# Patient Record
Sex: Female | Born: 1950 | Race: White | Hispanic: No | Marital: Married | Smoking: Never smoker
Health system: Southern US, Community
[De-identification: ages and names within clinical notes are randomized; demographics above are authoritative.]

## PROBLEM LIST (undated history)

## (undated) DIAGNOSIS — R7989 Other specified abnormal findings of blood chemistry: Secondary | ICD-10-CM

## (undated) DIAGNOSIS — I10 Essential (primary) hypertension: Secondary | ICD-10-CM

## (undated) DIAGNOSIS — R778 Other specified abnormalities of plasma proteins: Secondary | ICD-10-CM

## (undated) DIAGNOSIS — K589 Irritable bowel syndrome without diarrhea: Secondary | ICD-10-CM

## (undated) DIAGNOSIS — E039 Hypothyroidism, unspecified: Secondary | ICD-10-CM

## (undated) DIAGNOSIS — N280 Ischemia and infarction of kidney: Secondary | ICD-10-CM

## (undated) HISTORY — PX: TONSILLECTOMY: SUR1361

## (undated) HISTORY — PX: AUGMENTATION MAMMAPLASTY: SUR837

## (undated) HISTORY — DX: Other specified abnormalities of plasma proteins: R77.8

## (undated) HISTORY — PX: ECTOPIC PREGNANCY SURGERY: SHX613

## (undated) HISTORY — PX: KNEE SURGERY: SHX244

## (undated) HISTORY — PX: APPENDECTOMY: SHX54

## (undated) HISTORY — PX: CHOLECYSTECTOMY: SHX55

## (undated) HISTORY — DX: Other specified abnormal findings of blood chemistry: R79.89

---

## 1999-11-18 ENCOUNTER — Other Ambulatory Visit: Admission: RE | Admit: 1999-11-18 | Discharge: 1999-11-18 | Payer: Self-pay | Admitting: Obstetrics and Gynecology

## 2000-03-23 ENCOUNTER — Encounter: Admission: RE | Admit: 2000-03-23 | Discharge: 2000-03-23 | Payer: Self-pay | Admitting: Orthopedic Surgery

## 2000-03-23 ENCOUNTER — Encounter: Payer: Self-pay | Admitting: Orthopedic Surgery

## 2001-02-10 ENCOUNTER — Other Ambulatory Visit: Admission: RE | Admit: 2001-02-10 | Discharge: 2001-02-10 | Payer: Self-pay | Admitting: Obstetrics and Gynecology

## 2002-01-01 ENCOUNTER — Emergency Department (HOSPITAL_COMMUNITY): Admission: EM | Admit: 2002-01-01 | Discharge: 2002-01-02 | Payer: Self-pay

## 2002-02-12 ENCOUNTER — Other Ambulatory Visit: Admission: RE | Admit: 2002-02-12 | Discharge: 2002-02-12 | Payer: Self-pay | Admitting: Obstetrics and Gynecology

## 2003-04-19 ENCOUNTER — Other Ambulatory Visit: Admission: RE | Admit: 2003-04-19 | Discharge: 2003-04-19 | Payer: Self-pay | Admitting: Obstetrics and Gynecology

## 2004-04-20 ENCOUNTER — Other Ambulatory Visit: Admission: RE | Admit: 2004-04-20 | Discharge: 2004-04-20 | Payer: Self-pay | Admitting: Obstetrics and Gynecology

## 2005-03-25 ENCOUNTER — Encounter: Admission: RE | Admit: 2005-03-25 | Discharge: 2005-03-25 | Payer: Self-pay | Admitting: Orthopedic Surgery

## 2005-05-03 ENCOUNTER — Other Ambulatory Visit: Admission: RE | Admit: 2005-05-03 | Discharge: 2005-05-03 | Payer: Self-pay | Admitting: Obstetrics and Gynecology

## 2006-11-22 ENCOUNTER — Other Ambulatory Visit: Admission: RE | Admit: 2006-11-22 | Discharge: 2006-11-22 | Payer: Self-pay | Admitting: Obstetrics and Gynecology

## 2007-08-01 ENCOUNTER — Encounter: Admission: RE | Admit: 2007-08-01 | Discharge: 2007-08-01 | Payer: Self-pay | Admitting: Orthopedic Surgery

## 2007-09-13 ENCOUNTER — Inpatient Hospital Stay (HOSPITAL_COMMUNITY): Admission: EM | Admit: 2007-09-13 | Discharge: 2007-09-17 | Payer: Self-pay | Admitting: Emergency Medicine

## 2007-12-13 ENCOUNTER — Other Ambulatory Visit: Admission: RE | Admit: 2007-12-13 | Discharge: 2007-12-13 | Payer: Self-pay | Admitting: Obstetrics and Gynecology

## 2010-02-27 ENCOUNTER — Other Ambulatory Visit: Admission: RE | Admit: 2010-02-27 | Discharge: 2010-02-27 | Payer: Self-pay | Admitting: Obstetrics and Gynecology

## 2010-02-27 ENCOUNTER — Ambulatory Visit: Payer: Self-pay | Admitting: Obstetrics and Gynecology

## 2010-04-15 ENCOUNTER — Ambulatory Visit: Payer: Self-pay | Admitting: Obstetrics and Gynecology

## 2011-01-13 ENCOUNTER — Emergency Department (HOSPITAL_BASED_OUTPATIENT_CLINIC_OR_DEPARTMENT_OTHER)
Admission: EM | Admit: 2011-01-13 | Discharge: 2011-01-13 | Disposition: A | Discharge status: Home / Self Care | Payer: BC Managed Care – PPO | Attending: Emergency Medicine | Admitting: Emergency Medicine

## 2011-01-13 DIAGNOSIS — I10 Essential (primary) hypertension: Secondary | ICD-10-CM | POA: Insufficient documentation

## 2011-01-13 DIAGNOSIS — E039 Hypothyroidism, unspecified: Secondary | ICD-10-CM | POA: Insufficient documentation

## 2011-01-13 DIAGNOSIS — Z79899 Other long term (current) drug therapy: Secondary | ICD-10-CM | POA: Insufficient documentation

## 2011-01-26 NOTE — Discharge Summary (Signed)
NAMEABRIANA, SALTOS NO.:  [id number]   MEDICAL RECORD NO.:  [id number]          PATIENT TYPE:  INP   LOCATION:  1314                         FACILITY:  [facility]   PHYSICIAN:  Lonia Blood, M.D.       DATE OF BIRTH:  Jun 14, 1951   DATE OF ADMISSION:  09/13/2007  DATE OF DISCHARGE:  09/17/2007                               DISCHARGE SUMMARY   PRIMARY CARE PHYSICIAN:  Dr. Derrell Lolling, [place], [place].   DISCHARGE DIAGNOSES:  1. Left facial cellulitis.  2. Left ear cellulitis.  3. Hypertension.  4. Diarrhea related to antibiotics.  5. Vaginitis related to antibiotics.  6. Status post cholecystectomy.   DISCHARGE MEDICATIONS:  1. Septra DS one tablet p.o. b.i.d. for one week.  2. Doxepin 100 mg p.o. daily for one week.  3. Ibuprofen p.r.n. pain.  4. Imodium for diarrhea.  5. Benicar 10 mg daily.   CONDITION ON DISCHARGE:  Melinda Lane was discharged in good condition.   PHYSICAL EXAMINATION:  At the time of discharge, the patient is alert  and oriented, without any significant discomfort.  She is without any  signs of sepsis.  She is afebrile and stable.   DISCHARGE INSTRUCTIONS:  The patient has been told to report back to the  emergency room if she has worsening facial pain.  The patient has been  told to follow up with Dr. Derrell Lolling, her primary care physician in 3 to 4  days, to assure resolution of her cellulitis.   PROCEDURES DURING ADMISSION:  The patient underwent a CT scan of the  head, face and neck.  Findings of soft tissue swelling of the left  pinna, with periauricular and venous inflammation.  No evidence of  absence, mastoiditis or chondritis.  No evidence of abscess of the neck.  Minimal lymphadenopathy of the left side of the neck.   CONSULTATIONS:  None obtained.   HISTORY & PHYSICAL:  Refer to the dictated H&P done by Dr. Karilyn Cota on  09/13/07.   HOSPITAL COURSE:  1. Left facial cellulitis.  Mrs. Buenaventura was admitted on 12/31 with  worsening left face pain.  She had erythema and edema of her left      pinna.  The patient underwent a CT scan of the face, which was      negative for abscess formation.  The patient was treated with      vancomycin and Zosyn.  By 09/15/07, we had narrowed the antibiotics      to vancomycin only.  On 09/16/07, the patient was switched to oral      antibiotics - Septra b.i.d. She has remained stable, and her      cellulitis has since improved.  We feel that the patient can be      safely discharged home under the care of her primary care physician      with oral antibiotics and close followup.  2. Diarrhea.  On 09/15/07, the patient started having diarrhea.  Stool      was sent for C. difficile and she did not test positive.  We felt      that her diarrhea was secondary to Zosyn, and once we have      discontinued the antibiotic, the patient's diarrhea stopped.  3. Vaginitis.  This was probably related to the antibiotic use.  With      the initiation of Diflucan, the patient's symptoms are resolving.      We are going to treat the patient with Diflucan throughout the      course of antibiotics.      Lonia Blood, M.D.  Electronically Signed     SL/MEDQ  D:  09/17/2007  T:  09/17/2007  Job:  [id number]

## 2011-01-26 NOTE — H&P (Signed)
NAME:  Melinda Lane, Lane NO.:  [id number]   MEDICAL RECORD NO.:  [id number]          PATIENT TYPE:  EMS   LOCATION:  ED                           FACILITY:  [facility]   PHYSICIAN:  Wilson Singer, M.D.DATE OF BIRTH:  1951/02/21   DATE OF ADMISSION:  09/13/2007  DATE OF DISCHARGE:                              HISTORY & PHYSICAL   PRIMARY CARE PHYSICIAN:  Unassigned.   HISTORY:  This is a very pleasant 60 year old lady who noticed swelling  and redness of her left ear and left face.  She was trying to put in her  earring approximately 3 days ago and has was having difficulty putting  it in to the left ear lobe.  There was some localized bleeding when she  attempted to do this.  After this event, and she thinks she also may  have been in contact with some chicken and had touched that left ear,  she started to notice swelling of the left ear and redness as well as  facial redness.  She went to see her primary care physician yesterday  who gave her intramuscular antibiotic of some sort and swelling and  redness has not improved, in fact, has gotten worse.  She has not been  feeling feverish herself.   PAST MEDICAL HISTORY:  1. Hypertension.  2. Allergies for which she takes allergy shots.   PAST SURGICAL HISTORY:  1. Appendectomy at the age of 62.  2. Cholecystectomy.  3. Left salpingectomy following a tubal pregnancy.  4. Bilateral knee arthroscopies.   SOCIAL HISTORY:  She has been married for 36 years.  She does not smoke  and does not drink alcohol.  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   MEDICATIONS:  1. Benicar 10 mg daily.  2. Claritin 10 mg daily   ALLERGIES:  ZITHROMAX.   FAMILY HISTORY:  Noncontributory.   REVIEW OF SYSTEMS:  Apart from the symptoms mentioned above, there are  no other symptoms referable to all systems reviewed.   PHYSICAL EXAMINATION:  VITAL SIGNS:  Temperature 98.9, blood pressure  120/68, pulse 75, respiratory rate 12-14, saturation 99% on room air.  GENERAL:  She does not look clinically septic or toxic.  CARDIOVASCULAR:  Heart sounds are present and normal without any  murmurs.  RESPIRATORY:  Lung fields are clear.  ABDOMEN:  Soft and nontender with no hepatosplenomegaly.  NEUROLOGICAL:  Alert and oriented with no focal neurologic signs.  Examination of the face shows left ear cellulitis with left face  cellulitis extending into the left neck.  There is localized left neck  lymphadenopathy.   INVESTIGATIONS:  Hemoglobin 12.1, white blood cell count 15.4, platelets  346.  Sodium 140, potassium 3.8, bicarbonate 25, glucose 90, BUN 12,  creatinine 0.87.  CT scan of the left face was done which shows  localized cellulitis, but no evidence of abscess and no bony  involvement.   IMPRESSION:  1. Left ear/facial cellulitis which is severe.  2. Hypertension controlled.   PLAN:  1. Admit.  2. Intravenous  antibiotics.  3. Control hypertension.   Further recommendations will depend on the patient's hospital progress.      Wilson Singer, M.D.  Electronically Signed     NCG/MEDQ  D:  09/13/2007  T:  09/13/2007  Job:  [id number]

## 2011-06-03 LAB — COMPREHENSIVE METABOLIC PANEL
ALT: 20
Alkaline Phosphatase: 69
Calcium: 8.4
GFR calc Af Amer: >60
Potassium: 3.6
Sodium: 139
Total Protein: 5.6 — ABNORMAL LOW

## 2011-06-03 LAB — CBC
HCT: 35.2 — ABNORMAL LOW
Hemoglobin: 12
MCV: 89.7
MCV: 90
RBC: 3.59 — ABNORMAL LOW
RDW: 16.5 — ABNORMAL HIGH
WBC: 9
WBC: 9.6

## 2011-06-03 LAB — CLOSTRIDIUM DIFFICILE EIA

## 2011-06-09 ENCOUNTER — Encounter: Payer: Self-pay | Admitting: Obstetrics and Gynecology

## 2011-06-18 LAB — BASIC METABOLIC PANEL
BUN: 12
CO2: 25
Calcium: 8.5
Chloride: 105
Creatinine, Ser: 0.87
GFR calc Af Amer: >60
GFR calc non Af Amer: >60
Glucose, Bld: 90

## 2011-06-18 LAB — CULTURE, BLOOD (ROUTINE X 2)
Culture: NO GROWTH
Culture: NO GROWTH

## 2011-06-18 LAB — DIFFERENTIAL
Basophils Absolute: 0
Neutro Abs: 12 — ABNORMAL HIGH

## 2011-06-18 LAB — CBC
MCV: 90.4
RBC: 3.94
WBC: 13.4 — ABNORMAL HIGH

## 2011-06-23 DIAGNOSIS — I150 Renovascular hypertension: Secondary | ICD-10-CM | POA: Insufficient documentation

## 2011-06-23 HISTORY — DX: Renovascular hypertension: I15.0

## 2011-08-25 DIAGNOSIS — I773 Arterial fibromuscular dysplasia: Secondary | ICD-10-CM

## 2011-08-25 HISTORY — DX: Arterial fibromuscular dysplasia: I77.3

## 2011-10-14 DIAGNOSIS — M7050 Other bursitis of knee, unspecified knee: Secondary | ICD-10-CM | POA: Insufficient documentation

## 2011-10-14 DIAGNOSIS — M25562 Pain in left knee: Secondary | ICD-10-CM | POA: Insufficient documentation

## 2011-10-14 HISTORY — DX: Pain in left knee: M25.562

## 2011-10-14 HISTORY — DX: Other bursitis of knee, unspecified knee: M70.50

## 2012-04-11 ENCOUNTER — Observation Stay (HOSPITAL_BASED_OUTPATIENT_CLINIC_OR_DEPARTMENT_OTHER)
Admission: EM | Admit: 2012-04-11 | Discharge: 2012-04-12 | Disposition: A | Discharge status: Home / Self Care | Payer: BC Managed Care – PPO | Attending: Family Medicine | Admitting: Family Medicine

## 2012-04-11 ENCOUNTER — Emergency Department (HOSPITAL_BASED_OUTPATIENT_CLINIC_OR_DEPARTMENT_OTHER): Payer: BC Managed Care – PPO

## 2012-04-11 ENCOUNTER — Encounter (HOSPITAL_BASED_OUTPATIENT_CLINIC_OR_DEPARTMENT_OTHER): Payer: Self-pay

## 2012-04-11 DIAGNOSIS — I10 Essential (primary) hypertension: Secondary | ICD-10-CM | POA: Insufficient documentation

## 2012-04-11 DIAGNOSIS — I76 Septic arterial embolism: Secondary | ICD-10-CM | POA: Insufficient documentation

## 2012-04-11 DIAGNOSIS — R079 Chest pain, unspecified: Principal | ICD-10-CM | POA: Insufficient documentation

## 2012-04-11 DIAGNOSIS — E041 Nontoxic single thyroid nodule: Secondary | ICD-10-CM | POA: Insufficient documentation

## 2012-04-11 DIAGNOSIS — E785 Hyperlipidemia, unspecified: Secondary | ICD-10-CM | POA: Insufficient documentation

## 2012-04-11 DIAGNOSIS — E039 Hypothyroidism, unspecified: Secondary | ICD-10-CM | POA: Diagnosis present

## 2012-04-11 HISTORY — DX: Hypothyroidism, unspecified: E03.9

## 2012-04-11 HISTORY — DX: Ischemia and infarction of kidney: N28.0

## 2012-04-11 HISTORY — DX: Essential (primary) hypertension: I10

## 2012-04-11 LAB — BASIC METABOLIC PANEL
BUN: 13 mg/dL (ref 6–23)
CO2: 28 mEq/L (ref 19–32)
Chloride: 103 mEq/L (ref 96–112)
GFR calc Af Amer: 80 mL/min — ABNORMAL LOW (ref >90)
GFR calc non Af Amer: 69 mL/min — ABNORMAL LOW (ref >90)
Sodium: 140 mEq/L (ref 135–145)

## 2012-04-11 LAB — CBC
Hemoglobin: 14.8 g/dL (ref 12.0–15.0)
MCH: 30.5 pg (ref 26.0–34.0)
MCV: 86.8 fL (ref 78.0–100.0)
Platelets: 358 10*3/uL (ref 150–400)
RDW: 15 % (ref 11.5–15.5)

## 2012-04-11 LAB — TROPONIN I: Troponin I: <0.3 ng/mL (ref <0.30)

## 2012-04-11 MED ORDER — NITROGLYCERIN 0.4 MG SL SUBL
0.4000 mg | SUBLINGUAL_TABLET | SUBLINGUAL | Status: AC | PRN
  Administered 2012-04-11 (×3): 0.4 mg via SUBLINGUAL
  Filled 2012-04-11: qty 25

## 2012-04-11 MED ORDER — ASPIRIN 81 MG PO CHEW
243.0000 mg | CHEWABLE_TABLET | Freq: Once | ORAL | Status: AC
  Administered 2012-04-11: 243 mg via ORAL

## 2012-04-11 MED ORDER — HYDROMORPHONE HCL PF 1 MG/ML IJ SOLN
1.0000 mg | Freq: Once | INTRAMUSCULAR | Status: AC
  Administered 2012-04-11: 1 mg via INTRAVENOUS

## 2012-04-11 MED ORDER — ASPIRIN 81 MG PO CHEW
324.0000 mg | CHEWABLE_TABLET | Freq: Once | ORAL | Status: DC
  Filled 2012-04-11: qty 4

## 2012-04-11 MED ORDER — HYDROMORPHONE HCL PF 1 MG/ML IJ SOLN
INTRAMUSCULAR | Status: AC
  Administered 2012-04-11: 1 mg via INTRAVENOUS
  Filled 2012-04-11: qty 1

## 2012-04-11 NOTE — ED Notes (Signed)
C/p CP that started approx 15 min PTA-was picking up laundry

## 2012-04-11 NOTE — ED Notes (Signed)
Pt given 243 mg asa, pt states she took 1 tab 81 mg asa before leaving home.

## 2012-04-11 NOTE — ED Provider Notes (Addendum)
History     CSN: [id number]  Arrival date & time 04/11/12  2151   First MD Initiated Contact with Patient 04/11/12 2249      Chief Complaint  Patient presents with  . Chest Pain    (Consider location/radiation/quality/duration/timing/severity/associated sxs/prior treatment) HPI Patient with sudden severe epigastric pain began about one hour pta.  Pain improved after several minutes but continues with some slight 2/10 discomfort.  Patient with diaphoresis, weakness , and lightheaded at time of episode.  She had eaten spaghetti and meatballs for dinner.  She had her gb out about five years ago.  Patient with father with cad diagnosed in 54s.   Past Medical History  Diagnosis Date  . Hypertension   . 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Past Surgical History  Procedure Date  . Appendectomy   . Tonsillectomy   . Cholecystectomy   . Ectopic pregnancy surgery   . Knee surgery     No family history on file.  History  Substance Use Topics  . Smoking status: Never Smoker   . Smokeless tobacco: Not on file  . Alcohol Use: Yes    OB History    Grav Para Term Preterm Abortions TAB SAB Ect Mult Living                  Review of Systems  All other systems reviewed and are negative.    Allergies  Zithromax  Home Medications   Current Outpatient Rx  Name Route Sig Dispense Refill  . AMLODIPINE BESYLATE 2.5 MG PO TABS Oral Take 2.5 mg by mouth daily.    . ASPIRIN 81 MG PO TABS Oral Take 81 mg by mouth daily.    Marland Kitchen CALCIUM CARBONATE 1250 MG PO CAPS Oral Take 1,250 mg by mouth 2 (two) times daily with a meal.    . VITAMIN D 1000 UNITS PO TABS Oral Take 1,000 Units by mouth daily.    Marland Kitchen ONE-DAILY MULTI VITAMINS PO TABS Oral Take 1 tablet by mouth daily.    Marland Kitchen FISH OIL PO Oral Take 1 capsule by mouth daily.    Marland Kitchen PROGESTERONE MICRONIZED 200 MG PO CAPS Oral Take 200 mg by mouth daily.    . TELMISARTAN 40 MG PO TABS Oral Take 40 mg by mouth daily.    . THYROID (PORK) 15 MG PO CAPS  Oral Take 1 capsule by mouth 2 (two) times daily.    Marland Kitchen VITAMIN E 100 UNITS PO CAPS Oral Take 100 Units by mouth daily.      BP 159/90  Pulse 71  Resp 18  Ht 5\' 5"  (1.651 m)  Wt 132 lb (59.875 kg)  BMI 21.97 kg/m2  SpO2 98%  Physical Exam  Nursing note and vitals reviewed. Constitutional: She appears well-developed and well-nourished.  HENT:  Head: Normocephalic and atraumatic.  Eyes: Conjunctivae and EOM are normal. Pupils are equal, round, and reactive to light.  Neck: Normal range of motion. Neck supple.  Cardiovascular: Normal rate, regular rhythm, normal heart sounds and intact distal pulses.   Pulmonary/Chest: Effort normal and breath sounds normal.  Abdominal: Soft. Bowel sounds are normal. There is tenderness.       Epigastric tenderness  Musculoskeletal: Normal range of motion.  Neurological: She is alert.  Skin: Skin is warm and dry.  Psychiatric: She has a normal mood and affect. Thought content normal.    ED Course  Procedures (including critical care time)  Labs Reviewed  BASIC METABOLIC PANEL - Abnormal;  Notable for the following:    Glucose, Bld 107 (*)     GFR calc non Af Amer 69 (*)     GFR calc Af Amer 80 (*)     All other components within normal limits  CBC  TROPONIN I  LIPASE, BLOOD  HEPATIC FUNCTION PANEL   Dg Chest 2 View  04/11/2012  *RADIOLOGY REPORT*  Clinical Data: Chest pain  CHEST - 2 VIEW  Comparison: None.  Findings: The heart and pulmonary vascularity are within normal limits.  The lungs are clear.  The osseous structures are within normal limits.  IMPRESSION: No acute intrathoracic abnormality.  Original Report Authenticated By: Phillips Odor, M.D.     No diagnosis found.   Date: 05/22/2012  Rate: 63  Rhythm: normal sinus rhythm  QRS Axis: normal  Intervals: normal  ST/T Wave abnormalities: normal  Conduction Disutrbances: none  Narrative Interpretation: unremarkable      MDM  Hypertension- patient with known  hypertension and taking meds.  Patient given nitro here with decreased bp to sbp of 145 but pain in epigastric region increasing. Plan ct angio chest and abdomen.  Discussed with Dr. Nicanor Alcon and she will follow up ct results then if normal, arrange admission to [facility] for chest pain.         Hilario Quarry, MD 04/15/12 1558  Hilario Quarry, MD 05/22/12 [phone]

## 2012-04-11 NOTE — ED Notes (Signed)
MD at bedside. 

## 2012-04-12 ENCOUNTER — Encounter (HOSPITAL_COMMUNITY): Payer: Self-pay | Admitting: Internal Medicine

## 2012-04-12 DIAGNOSIS — E039 Hypothyroidism, unspecified: Secondary | ICD-10-CM | POA: Diagnosis present

## 2012-04-12 DIAGNOSIS — E041 Nontoxic single thyroid nodule: Secondary | ICD-10-CM | POA: Diagnosis present

## 2012-04-12 DIAGNOSIS — R079 Chest pain, unspecified: Secondary | ICD-10-CM

## 2012-04-12 DIAGNOSIS — E785 Hyperlipidemia, unspecified: Secondary | ICD-10-CM

## 2012-04-12 DIAGNOSIS — I10 Essential (primary) hypertension: Secondary | ICD-10-CM

## 2012-04-12 DIAGNOSIS — I359 Nonrheumatic aortic valve disorder, unspecified: Secondary | ICD-10-CM

## 2012-04-12 HISTORY — DX: Hypothyroidism, unspecified: E03.9

## 2012-04-12 HISTORY — DX: Nontoxic single thyroid nodule: E04.1

## 2012-04-12 HISTORY — DX: Essential (primary) hypertension: I10

## 2012-04-12 HISTORY — DX: Hyperlipidemia, unspecified: E78.5

## 2012-04-12 HISTORY — DX: Chest pain, unspecified: R07.9

## 2012-04-12 LAB — CBC
HCT: 37.4 % (ref 36.0–46.0)
MCH: 30.9 pg (ref 26.0–34.0)
MCHC: 34.2 g/dL (ref 30.0–36.0)
MCV: 90.3 fL (ref 78.0–100.0)
RDW: 15 % (ref 11.5–15.5)

## 2012-04-12 LAB — CARDIAC PANEL(CRET KIN+CKTOT+MB+TROPI)
CK, MB: 1.6 ng/mL (ref 0.3–4.0)
Relative Index: INVALID (ref 0.0–2.5)
Total CK: 73 U/L (ref 7–177)
Troponin I: <0.3 ng/mL (ref <0.30)
Troponin I: <0.3 ng/mL (ref <0.30)

## 2012-04-12 LAB — HEPATIC FUNCTION PANEL
ALT: 16 U/L (ref 0–35)
AST: 28 U/L (ref 0–37)
Albumin: 3.9 g/dL (ref 3.5–5.2)
Alkaline Phosphatase: 78 U/L (ref 39–117)
Total Bilirubin: 0.7 mg/dL (ref 0.3–1.2)

## 2012-04-12 LAB — CREATININE, SERUM: GFR calc non Af Amer: 79 mL/min — ABNORMAL LOW (ref >90)

## 2012-04-12 LAB — LIPID PANEL
LDL Cholesterol: 95 mg/dL (ref 0–99)
VLDL: 8 mg/dL (ref 0–40)

## 2012-04-12 MED ORDER — AMLODIPINE BESYLATE 2.5 MG PO TABS
2.5000 mg | ORAL_TABLET | Freq: Every day | ORAL | Status: DC
  Administered 2012-04-12: 2.5 mg via ORAL
  Filled 2012-04-12: qty 1

## 2012-04-12 MED ORDER — CALCIUM CARBONATE 1250 (500 CA) MG PO TABS
1.0000 | ORAL_TABLET | Freq: Two times a day (BID) | ORAL | Status: DC
  Administered 2012-04-12 (×2): 500 mg via ORAL
  Filled 2012-04-12 (×3): qty 1

## 2012-04-12 MED ORDER — ACETAMINOPHEN 325 MG PO TABS: 650.0000 mg | ORAL_TABLET | Freq: Four times a day (QID) | ORAL | Status: DC | PRN

## 2012-04-12 MED ORDER — THYROID 30 MG PO TABS
15.0000 mg | ORAL_TABLET | Freq: Two times a day (BID) | ORAL | Status: DC
  Administered 2012-04-12: 15 mg via ORAL
  Filled 2012-04-12 (×2): qty 1

## 2012-04-12 MED ORDER — ONDANSETRON HCL 4 MG/2ML IJ SOLN: 4.0000 mg | Freq: Four times a day (QID) | INTRAMUSCULAR | Status: DC | PRN

## 2012-04-12 MED ORDER — THYROID (PORK) 15 MG PO CAPS: 1.0000 | ORAL_CAPSULE | Freq: Two times a day (BID) | ORAL | Status: DC

## 2012-04-12 MED ORDER — SODIUM CHLORIDE 0.9 % IV SOLN
INTRAVENOUS | Status: DC
  Administered 2012-04-12: 07:00:00 via INTRAVENOUS

## 2012-04-12 MED ORDER — OMEGA-3-ACID ETHYL ESTERS 1 G PO CAPS
1.0000 g | ORAL_CAPSULE | Freq: Every day | ORAL | Status: DC
  Administered 2012-04-12: 1 g via ORAL
  Filled 2012-04-12: qty 1

## 2012-04-12 MED ORDER — VITAMIN D3 25 MCG (1000 UNIT) PO TABS
1000.0000 [IU] | ORAL_TABLET | Freq: Every day | ORAL | Status: DC
  Administered 2012-04-12: 1000 [IU] via ORAL
  Filled 2012-04-12: qty 1

## 2012-04-12 MED ORDER — SODIUM CHLORIDE 0.9 % IJ SOLN
3.0000 mL | Freq: Two times a day (BID) | INTRAMUSCULAR | Status: DC
  Administered 2012-04-12: 3 mL via INTRAVENOUS

## 2012-04-12 MED ORDER — NITROGLYCERIN 0.4 MG SL SUBL: 0.4000 mg | SUBLINGUAL_TABLET | SUBLINGUAL | Status: DC | PRN

## 2012-04-12 MED ORDER — ASPIRIN EC 81 MG PO TBEC
81.0000 mg | DELAYED_RELEASE_TABLET | Freq: Every day | ORAL | Status: DC
  Administered 2012-04-12: 81 mg via ORAL
  Filled 2012-04-12: qty 1

## 2012-04-12 MED ORDER — VITAMIN E 45 MG (100 UNIT) PO CAPS
100.0000 [IU] | ORAL_CAPSULE | Freq: Every day | ORAL | Status: DC
  Administered 2012-04-12: 100 [IU] via ORAL
  Filled 2012-04-12: qty 1

## 2012-04-12 MED ORDER — CALCIUM CARBONATE 1250 (500 CA) MG PO CAPS: 1250.0000 mg | ORAL_CAPSULE | Freq: Two times a day (BID) | ORAL | Status: DC

## 2012-04-12 MED ORDER — IRBESARTAN 75 MG PO TABS
75.0000 mg | ORAL_TABLET | Freq: Every day | ORAL | Status: DC
  Administered 2012-04-12: 75 mg via ORAL
  Filled 2012-04-12: qty 1

## 2012-04-12 MED ORDER — ACETAMINOPHEN 650 MG RE SUPP: 650.0000 mg | Freq: Four times a day (QID) | RECTAL | Status: DC | PRN

## 2012-04-12 MED ORDER — ENOXAPARIN SODIUM 40 MG/0.4ML ~~LOC~~ SOLN
40.0000 mg | Freq: Every day | SUBCUTANEOUS | Status: DC
  Administered 2012-04-12: 40 mg via SUBCUTANEOUS
  Filled 2012-04-12: qty 0.4

## 2012-04-12 MED ORDER — SODIUM CHLORIDE 0.9 % IV BOLUS (SEPSIS)
500.0000 mL | Freq: Once | INTRAVENOUS | Status: AC
  Administered 2012-04-12: 500 mL via INTRAVENOUS

## 2012-04-12 MED ORDER — IOHEXOL 350 MG/ML SOLN
100.0000 mL | Freq: Once | INTRAVENOUS | Status: AC | PRN
  Administered 2012-04-12: 100 mL via INTRAVENOUS

## 2012-04-12 MED ORDER — NITROGLYCERIN 0.4 MG SL SUBL
0.4000 mg | SUBLINGUAL_TABLET | SUBLINGUAL | Status: DC | PRN
Start: 2012-04-12 — End: 2012-04-12

## 2012-04-12 MED ORDER — MORPHINE SULFATE 2 MG/ML IJ SOLN: 2.0000 mg | INTRAMUSCULAR | Status: DC | PRN

## 2012-04-12 MED ORDER — ONDANSETRON HCL 4 MG PO TABS: 4.0000 mg | ORAL_TABLET | Freq: Four times a day (QID) | ORAL | Status: DC | PRN

## 2012-04-12 MED ORDER — PROGESTERONE MICRONIZED 200 MG PO CAPS
200.0000 mg | ORAL_CAPSULE | Freq: Every day | ORAL | Status: DC
  Filled 2012-04-12: qty 1

## 2012-04-12 MED ORDER — ADULT MULTIVITAMIN W/MINERALS CH
1.0000 | ORAL_TABLET | Freq: Every day | ORAL | Status: DC
  Administered 2012-04-12: 1 via ORAL
  Filled 2012-04-12: qty 1

## 2012-04-12 NOTE — Discharge Summary (Signed)
Physician Discharge Summary  Patient ID: Melinda Lane MRN: [id number] DOB/AGE: [birth year] 61 y.o.  Admit date: 04/11/2012 Discharge date: 04/12/2012  Discharge Diagnoses:  Principal Problem:  *Chest pain - resolved now.   HTN (hypertension)  Hypothyroidism  Hyperlipemia  Thyroid nodule  Recommendations for PCP on outpatient follow up:  Please schedule stress myoview study for patient ASAP, follow up TSH lab done in hospital, recheck BP, Please Reassess thyroid nodule  Discharged Condition: good  Hospital Course: From H&P:  A 61 year old female with history of hypertension and renal artery stenosis who [facility] [place] [facility] his central chest pain today. Just posterior to and 9:00 at night was sharp initially 8/10 centrally located radiating to her left chest. Patient has not had any pain like this before. She has no history of coronary artery disease. She has family history of coronary artery disease with her father dying at age of 95 after multiple bypass surgeries. She has risk factors including hypertension also but no tobacco use. She has hyperlipidemia. Patient felt nauseated but no vomiting no diaphoresis no shortness of breath. She denied fever or cough. Initial evaluation in the ER was uneventful with normal enzymes as well as EKG. Patient is admitted for further workup due to her moderate risk factors.   #1 chest pain: Pain completely resolved now.   Serial cardiac enzymes negative for acute ischemia and echocardiogram was obtained and no significant acute pathology identified (see below).  Pt may benefit from a cardiac stress myoview test. Per patient she had a stress test many years ago that was normal. She had a CT angiogram of the chest today which showed no evidence of pulmonary embolism. Also no evidence of aortic dissection only evidence of renal artery fibrosis. It did show a thyroid nodule.  Pt declined to have a thyroid ultrasound done.  She says she had this nodule  worked up with endocrinology and has had biopsy done and results were benign.  Pt was adamant about going home. She did not want to stay another night in hospital and insisted to go home.   #2 hypertension: continue home medications and follow up with PCP.   #3 hypothyroidism: TSH pending at time of discharge.  Follow results with PCP.  continue home medications.   #4 hyperlipidemia: Well controlled as tested in hospital.   Significant Diagnostic Studies:  2D ECHO  Study Conclusions  - Left ventricle: The cavity size was normal. Wall thickness was increased in a pattern of mild LVH. Systolic function was normal. The estimated ejection fraction was in the range of 60% to 65%. Wall motion was normal; there were no regional wall motion abnormalities. - Aortic valve: Mild regurgitation. 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 M-mode, complete 2D, spectral Doppler, and color Doppler. Height: Height: 165.1cm. Height: 65in. Weight: Weight: 61.2kg. Weight: 134.7lb. Body mass index: BMI: 22.5kg/m^2. Body surface area: BSA: 1.41m^2. Blood pressure: 145/87. Patient status: Inpatient. Location: Bedside. Left ventricle: The cavity size was normal. Wall thickness was increased in a pattern of mild LVH. Systolic function was normal. The estimated ejection fraction was in the range of 60% to 65%. Wall motion was normal  CT Angio Chest and Abdomen IMPRESSION:  1. No evidence for dissection or aneurysm.  2. Normal heart size.  3. Right thyroid lobe nodule or cyst. Further evaluation with  thyroid ultrasound is suggested.  IMPRESSION:  1. No evidence for dissection or  aneurysm.  2. 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  3. 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 See above.  Discharge Vitals: Blood pressure 145/87,  pulse 69, temperature 98.4 F (36.9 C), temperature source Oral, resp. rate 16, height 5' 5.5" (1.664 m), weight 61.281 kg (135 lb 1.6 oz), SpO2 99.00%.  Disposition: 01-Home or Self Care  With close outpatient follow up recommended.   Medication List  As of 04/12/2012  5:59 PM   TAKE these medications         amLODipine 2.5 MG tablet   Commonly known as: NORVASC   Take 2.5 mg by mouth daily.      aspirin 81 MG tablet   Take 81 mg by mouth daily.      calcium carbonate 1250 MG capsule   Take 1,250 mg by mouth 2 (two) times daily with a meal.      cholecalciferol 1000 UNITS tablet   Commonly known as: VITAMIN D   Take 1,000 Units by mouth daily.      FISH OIL PO   Take 1 capsule by mouth daily.      multivitamin tablet   Take 1 tablet by mouth daily.      nitroGLYCERIN 0.4 MG SL tablet   Commonly known as: NITROSTAT   Place 1 tablet (0.4 mg total) under the tongue every 5 (five) minutes x 3 doses as needed for chest pain.      progesterone 200 MG capsule   Commonly known as: PROMETRIUM   Take 200 mg by mouth daily.      telmisartan 40 MG tablet   Commonly known as: MICARDIS   Take 40 mg by mouth daily.      Thyroid (Pork) 15 MG Caps   Take 1 capsule by mouth 2 (two) times daily.      vitamin E 100 UNIT capsule   Take 100 Units by mouth daily.           Follow-up Information    Follow up with CRAFT,PAUL E., PA-C. Schedule an appointment as soon as possible for a visit in 1 week. [facility] follow up.  Reasses thyroid nodule)       Follow up with JOBE,[name] B., MD. Schedule an appointment as soon as possible for a visit in 1 week. (hospital follow up)    Contact information:   [address] [place] [address] [phone]       Follow up with Outpatient Stress Myoview. Schedule an appointment as soon as possible for a visit in 1 week. (PCP to set up for patient on follow up )         I spent 35 mins preparing discharge, reviewing records,  labs, imaging, reconciling meds, etc.   Signed: Standley Dakins MD 04/12/2012, 5:59 PM [facility] [phone]

## 2012-04-12 NOTE — Progress Notes (Signed)
Pt discharged to home per MD order, pending results of final cardiac enzymes, which were negative.  Pt received all discharge instructions and medication information including follow-up appointments, use of sublingual nitroglycerin and prescription.  Pt alert and oriented at discharge with no complaints of pain.  Pt escorted to private vehicle via wheelchair.  Efraim Kaufmann

## 2012-04-12 NOTE — Progress Notes (Signed)
I spoke with patient about the thyroid nodule and she says that she has already known about this and has seen an endocrinologist to have this worked up in the past 1-2 years.  She says she has had a thyroid ultrasound and also had a thyroid biopsy and the results were benign.  I asked her to please follow up about this with her primary care provider on follow up and she said that she would do so.  She did not want to proceed with getting a thyroid ultrasound while in the hospital for this visit.   She is asking to go home.  She denies any further chest pain or symptoms.  Maryln Manuel, MD

## 2012-04-12 NOTE — Progress Notes (Signed)
  Echocardiogram 2D Echocardiogram has been performed.  Melinda Lane 04/12/2012, 3:06 PM

## 2012-04-12 NOTE — H&P (Signed)
Melinda Lane is an 61 y.o. female.   Chief Complaint: Chest pain HPI: A 61 year old female with history of hypertension and renal artery stenosis who [facility] of his central chest pain today. Just posterior to and 9:00 at night was sharp initially 8/10 centrally located radiating to her left chest. Patient has not had any pain like this before. She has no history of coronary artery disease. She has family history of coronary artery disease with her father dying at age of 61 after multiple bypass surgeries. She has risk factors including hypertension also but no tobacco use. She has hyperlipidemia. Patient felt nauseated but no vomiting no diaphoresis no shortness of breath. She denied fever or cough. Initial evaluation in the ER was uneventful with normal enzymes as well as EKG. Patient is admitted for further workup due to her moderate risk factors.  Past Medical History  Diagnosis Date  . Hypertension   . 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. Hypothyroidism     Past Surgical History  Procedure Date  . Appendectomy   . Tonsillectomy   . Cholecystectomy   . Ectopic pregnancy surgery   . Knee surgery     History reviewed. No pertinent family history. Social History:  reports that she has never smoked. She does not have any smokeless tobacco history on file. She reports that she drinks alcohol. She reports that she does not use illicit drugs.  Allergies:  Allergies  Allergen Reactions  . Zithromax (Azithromycin) Rash    Medications Prior to Admission  Medication Sig Dispense Refill  . amLODipine (NORVASC) 2.5 MG tablet Take 2.5 mg by mouth daily.      Marland Kitchen aspirin 81 MG tablet Take 81 mg by mouth daily.      . calcium carbonate 1250 MG capsule Take 1,250 mg by mouth 2 (two) times daily with a meal.      . cholecalciferol (VITAMIN D) 1000 UNITS tablet Take 1,000 Units by mouth daily.      . Multiple Vitamin (MULTIVITAMIN) tablet Take 1 tablet by mouth daily.      .  Omega-3 Fatty Acids (FISH OIL PO) Take 1 capsule by mouth daily.      . progesterone (PROMETRIUM) 200 MG capsule Take 200 mg by mouth daily.      Marland Kitchen telmisartan (MICARDIS) 40 MG tablet Take 40 mg by mouth daily.      . Thyroid, Pork, 15 MG CAPS Take 1 capsule by mouth 2 (two) times daily.      . vitamin E 100 UNIT capsule Take 100 Units by mouth daily.        Results for orders placed during the hospital encounter of 04/11/12 (from the past 48 hour(s))  CBC     Status: Normal   Collection Time   04/11/12 10:30 PM      Component Value Range Comment   WBC 8.4  4.0 - 10.5 K/uL    RBC 4.85  3.87 - 5.11 MIL/uL    Hemoglobin 14.8  12.0 - 15.0 g/dL    HCT 40.9  81.1 - 91.4 %    MCV 86.8  78.0 - 100.0 fL    MCH 30.5  26.0 - 34.0 pg    MCHC 35.2  30.0 - 36.0 g/dL    RDW 78.2  95.6 - 21.3 %    Platelets 358  150 - 400 K/uL   BASIC METABOLIC PANEL     Status: Abnormal   Collection Time  04/11/12 10:30 PM      Component Value Range Comment   Sodium 140  135 - 145 mEq/L    Potassium 4.0  3.5 - 5.1 mEq/L    Chloride 103  96 - 112 mEq/L    CO2 28  19 - 32 mEq/L    Glucose, Bld 107 (*) 70 - 99 mg/dL    BUN 13  6 - 23 mg/dL    Creatinine, Ser 7.82  0.50 - 1.10 mg/dL    Calcium 9.4  8.4 - 95.6 mg/dL    GFR calc non Af Amer 69 (*) >90 mL/min    GFR calc Af Amer 80 (*) >90 mL/min   TROPONIN I     Status: Normal   Collection Time   04/11/12 10:30 PM      Component Value Range Comment   Troponin I <0.30  <0.30 ng/mL   LIPASE, BLOOD     Status: Normal   Collection Time   04/11/12 10:47 PM      Component Value Range Comment   Lipase 37  11 - 59 U/L    Dg Chest 2 View  04/11/2012  *RADIOLOGY REPORT*  Clinical Data: Chest pain  CHEST - 2 VIEW  Comparison: None.  Findings: The heart and pulmonary vascularity are within normal limits.  The lungs are clear.  The osseous structures are within normal limits.  IMPRESSION: No acute intrathoracic abnormality.  Original Report Authenticated By: Phillips Odor, M.D.   Ct Angio Abdomen W/cm &/or Wo Contrast  04/12/2012  *RADIOLOGY REPORT*  Clinical Data:  Chest and back pain.  Prior appendectomy, cholecystectomy, and ectopic pregnancy surgery.  CT ANGIOGRAPHY CHEST, ABDOMEN AND PELVIS  Technique:  Multidetector CT imaging through the chest, abdomen and pelvis was performed using the standard protocol during bolus administration of intravenous contrast.  Multiplanar reconstructed images including MIPs were obtained and reviewed to evaluate the vascular anatomy.  Contrast: OMNIPAQUE IOHEXOL 350 MG/ML SOLN  Comparison:  MRI of the L-spine 08/02/2007  CTA CHEST  Findings:  The thoracic aorta is well opacified by contrast.  There is no evidence for aneurysm or dissection.  Incidentally, the pulmonary arteries are also well opacified.  No evidence for acute pulmonary embolus. No mediastinal, hilar, or axillary adenopathy. The heart is normal in size.  There are no pulmonary nodules, pleural effusions, or infiltrates.  There is atelectasis in the right middle lobe.  The patient has bilateral breast implants in a subpectoral location.  There is low attenuation within the right aspect of the thyroid, measuring 1.4 x 1.3 cm.  This raises a question of cyst or solid nodule.  Further evaluation with ultrasound suggested.   Review of the MIP images confirms the above findings.  IMPRESSION:  1.  No evidence for dissection or aneurysm. 2.  Normal heart size. 3.  Right thyroid lobe nodule or cyst.  Further evaluation with thyroid ultrasound is suggested.  CTA ABDOMEN AND PELVIS  Findings:  The abdominal aorta and its branches are well opacified by contrast.  No evidence for dissection or aneurysm.  The renal artery ostium there are well opacified.  Celiac axis, this may, and IMA are well opacified by the contrast.  Note is made of mild irregularity of the right renal artery, raising question of mild FMD.  Within the pelvis, there is early filling of prominent left gonadal  vein and prominent left pelvic pains, consistent with pelvic congestion.  Extending off the inferior aspect of liver, there  is a low density lesion which measures 5.9 x 3.7 cm.  Early, peripheral enhancement is consistent with hemangioma.  Because of the exophytic location and size, the stomach, small bowel loops, and large bowel loops are normal in appearance.  There is no free pelvic fluid.  Visualized osseous structures have a normal appearance. This lesion is not some small risk of rupture. Small hypervascular lesion is also identified within the right hepatic lobe, measuring 8 mm on image number 116.  This may also represent a small hemangioma but is not fully characterized.  The spleen, pancreas, and adrenal glands have a normal appearance. Left renal cyst is present.  Small right renal cysts are noted.   Review of the MIP images confirms the above findings.  IMPRESSION:  1.  No evidence for dissection or aneurysm. 2.  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 3.  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 See above.  The findings were discussed with Dr. Terressa Koyanagi on 04/12/2012 at 12:52 a.m.  Original Report Authenticated By: Patterson Hammersmith, M.D.   Ct Angio Chest Aortic Dissect W &/or W/o  04/12/2012  *RADIOLOGY REPORT*  Clinical Data:  Chest and back pain.  Prior appendectomy, cholecystectomy, and ectopic pregnancy surgery.  CT ANGIOGRAPHY CHEST, ABDOMEN AND PELVIS  Technique:  Multidetector CT imaging through the chest, abdomen and pelvis was performed using the standard protocol during bolus administration of intravenous contrast.  Multiplanar reconstructed images including MIPs were obtained and reviewed to evaluate the vascular anatomy.  Contrast: OMNIPAQUE IOHEXOL 350 MG/ML SOLN  Comparison:  MRI of the L-spine 08/02/2007  CTA CHEST  Findings:  The thoracic aorta is well opacified by contrast.  There is no evidence for aneurysm or  dissection.  Incidentally, the pulmonary arteries are also well opacified.  No evidence for acute pulmonary embolus. No mediastinal, hilar, or axillary adenopathy. The heart is normal in size.  There are no pulmonary nodules, pleural effusions, or infiltrates.  There is atelectasis in the right middle lobe.  The patient has bilateral breast implants in a subpectoral location.  There is low attenuation within the right aspect of the thyroid, measuring 1.4 x 1.3 cm.  This raises a question of cyst or solid nodule.  Further evaluation with ultrasound suggested.   Review of the MIP images confirms the above findings.  IMPRESSION:  1.  No evidence for dissection or aneurysm. 2.  Normal heart size. 3.  Right thyroid lobe nodule or cyst.  Further evaluation with thyroid ultrasound is suggested.  CTA ABDOMEN AND PELVIS  Findings:  The abdominal aorta and its branches are well opacified by contrast.  No evidence for dissection or aneurysm.  The renal artery ostium there are well opacified.  Celiac axis, this may, and IMA are well opacified by the contrast.  Note is made of mild irregularity of the right renal artery, raising question of mild FMD.  Within the pelvis, there is early filling of prominent left gonadal vein and prominent left pelvic pains, consistent with pelvic congestion.  Extending off the inferior aspect of liver, there is a low density lesion which measures 5.9 x 3.7 cm.  Early, peripheral enhancement is consistent with hemangioma.  Because of the exophytic location and size, the stomach, small bowel loops, and large bowel loops are normal in appearance.  There is no free pelvic fluid.  Visualized osseous structures have a normal appearance. This lesion is not some small risk of  rupture. Small hypervascular lesion is also identified within the right hepatic lobe, measuring 8 mm on image number 116.  This may also represent a small hemangioma but is not fully characterized.  The spleen, pancreas, and adrenal  glands have a normal appearance. Left renal cyst is present.  Small right renal cysts are noted.   Review of the MIP images confirms the above findings.  IMPRESSION:  1.  No evidence for dissection or aneurysm. 2.  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 3.  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 See above.  The findings were discussed with Dr. Terressa Koyanagi on 04/12/2012 at 12:52 a.m.  Original Report Authenticated By: Patterson Hammersmith, M.D.    Review of Systems  HENT: Negative.   Eyes: Negative.   Respiratory: Negative.   Cardiovascular: Positive for chest pain.  Gastrointestinal: Positive for nausea. Negative for vomiting, abdominal pain and diarrhea.  Genitourinary: Negative.   Skin: Negative.   Neurological: Positive for weakness.  Endo/Heme/Allergies: Negative.   Psychiatric/Behavioral: Negative.     Blood pressure 160/95, pulse 66, temperature 97.7 F (36.5 C), temperature source Oral, resp. rate 16, height 5' 5.5" (1.664 m), weight 61.281 kg (135 lb 1.6 oz), SpO2 99.00%. Physical Exam  Constitutional: She is oriented to person, place, and time. She appears well-developed and well-nourished.  HENT:  Head: Normocephalic and atraumatic.  Right Ear: External ear normal.  Left Ear: External ear normal.  Nose: Nose normal.  Mouth/Throat: Oropharynx is clear and moist.  Eyes: Conjunctivae and EOM are normal. Pupils are equal, round, and reactive to light.  Neck: Normal range of motion. Neck supple.  Cardiovascular: Normal rate, regular rhythm, normal heart sounds and intact distal pulses.   Respiratory: Effort normal and breath sounds normal.  GI: Soft. Bowel sounds are normal.  Musculoskeletal: Normal range of motion. She exhibits no edema.  Neurological: She is alert and oriented to person, place, and time. She has normal reflexes.  Skin: Skin is warm and dry.  Psychiatric: She has a normal mood and affect. Her behavior is  normal. Judgment and thought content normal.     Assessment/Plan 61 year old female with chest pain admitted for rule out.  Plan #1 chest pain: Patient will be admitted for observation. We'll check serial cardiac enzymes get echocardiogram. If all results are normal she'll probably benefit from a stress test. Per patient she had a stress test many years ago that was normal. She had a CT angiogram of the chest today which showed no evidence of pulmonary embolism. Also no evidence of aortic dissection only evidence of renal artery fibrosis.  #2 hypertension: We'll continue his home medications and titrate as necessary.  #3 hypothyroidism: we'll check thyroid function and continue her home medications.  #4 hyperlipidemia: We will check fasting lipid panel and continue with her medications. If we need to make further adjustments we will make or add a statin.  Tyliah Schlereth,LAWAL 04/12/2012, 5:53 AM

## 2012-04-12 NOTE — ED Notes (Signed)
MD at bedside.  Explaining to pt that she will not be able to be transferred to [facility] due to no beds available. MD giving pt her plan of care options to consider.  Husband is at bedside.

## 2012-04-12 NOTE — Plan of Care (Signed)
Problem: Consults Goal: Tobacco Cessation referral if indicated Outcome: Not Applicable Date Met:  04/12/12 Pt does not smoke Goal: Diabetes Guidelines if Diabetic/Glucose > 140 If diabetic or lab glucose is > 140 mg/dl - Initiate Diabetes/Hyperglycemia Guidelines & Document Interventions  Outcome: Not Applicable Date Met:  04/12/12 Pt is not a diabetic

## 2012-04-12 NOTE — Progress Notes (Signed)
Pt was seen and examined.  H&P and orders reviewed.   C. Elfriede Bonini, MD 

## 2012-04-13 LAB — TSH: TSH: 0.178 u[IU]/mL — ABNORMAL LOW (ref 0.350–4.500)

## 2012-04-13 NOTE — Plan of Care (Signed)
Pt's TSH came back low at 0.178.  Pt was discharged yesterday to follow up with PCP in 1 week.  Please address this with patient and adjust her meds and evaluate her thyroid nodule.   Thanks.  Maryln Manuel, MD

## 2012-11-27 ENCOUNTER — Emergency Department (HOSPITAL_BASED_OUTPATIENT_CLINIC_OR_DEPARTMENT_OTHER)
Admission: EM | Admit: 2012-11-27 | Discharge: 2012-11-27 | Disposition: A | Discharge status: Home / Self Care | Payer: BC Managed Care – PPO | Attending: Emergency Medicine | Admitting: Emergency Medicine

## 2012-11-27 ENCOUNTER — Encounter (HOSPITAL_BASED_OUTPATIENT_CLINIC_OR_DEPARTMENT_OTHER): Payer: Self-pay | Admitting: *Deleted

## 2012-11-27 DIAGNOSIS — IMO0002 Reserved for concepts with insufficient information to code with codable children: Secondary | ICD-10-CM | POA: Insufficient documentation

## 2012-11-27 DIAGNOSIS — Z872 Personal history of diseases of the skin and subcutaneous tissue: Secondary | ICD-10-CM | POA: Insufficient documentation

## 2012-11-27 DIAGNOSIS — L02619 Cutaneous abscess of unspecified foot: Secondary | ICD-10-CM | POA: Insufficient documentation

## 2012-11-27 DIAGNOSIS — Y939 Activity, unspecified: Secondary | ICD-10-CM | POA: Insufficient documentation

## 2012-11-27 DIAGNOSIS — Y929 Unspecified place or not applicable: Secondary | ICD-10-CM | POA: Insufficient documentation

## 2012-11-27 DIAGNOSIS — L0390 Cellulitis, unspecified: Secondary | ICD-10-CM

## 2012-11-27 DIAGNOSIS — I10 Essential (primary) hypertension: Secondary | ICD-10-CM | POA: Insufficient documentation

## 2012-11-27 DIAGNOSIS — R21 Rash and other nonspecific skin eruption: Secondary | ICD-10-CM | POA: Insufficient documentation

## 2012-11-27 DIAGNOSIS — W57XXXA Bitten or stung by nonvenomous insect and other nonvenomous arthropods, initial encounter: Secondary | ICD-10-CM

## 2012-11-27 DIAGNOSIS — E039 Hypothyroidism, unspecified: Secondary | ICD-10-CM | POA: Insufficient documentation

## 2012-11-27 DIAGNOSIS — Z23 Encounter for immunization: Secondary | ICD-10-CM | POA: Insufficient documentation

## 2012-11-27 DIAGNOSIS — Z87448 Personal history of other diseases of urinary system: Secondary | ICD-10-CM | POA: Insufficient documentation

## 2012-11-27 DIAGNOSIS — L299 Pruritus, unspecified: Secondary | ICD-10-CM | POA: Insufficient documentation

## 2012-11-27 DIAGNOSIS — Z7982 Long term (current) use of aspirin: Secondary | ICD-10-CM | POA: Insufficient documentation

## 2012-11-27 DIAGNOSIS — Z79899 Other long term (current) drug therapy: Secondary | ICD-10-CM | POA: Insufficient documentation

## 2012-11-27 MED ORDER — TETANUS-DIPHTH-ACELL PERTUSSIS 5-2.5-18.5 LF-MCG/0.5 IM SUSP
0.5000 mL | Freq: Once | INTRAMUSCULAR | Status: AC
  Administered 2012-11-27: 0.5 mL via INTRAMUSCULAR
  Filled 2012-11-27: qty 0.5

## 2012-11-27 MED ORDER — SULFAMETHOXAZOLE-TRIMETHOPRIM 800-160 MG PO TABS: 1.0000 | ORAL_TABLET | Freq: Two times a day (BID) | ORAL | Status: DC

## 2012-11-27 MED ORDER — PREDNISONE 10 MG PO TABS: 40.0000 mg | ORAL_TABLET | Freq: Every day | ORAL | Status: DC

## 2012-11-27 NOTE — ED Provider Notes (Signed)
History    Scribed for Rolan Bucco, MD, the patient was seen in room MH05/MH05. This chart was scribed by Lewanda Rife, ED scribe. Patient's care was started at 1516   CSN: [id number]  Arrival date & time 11/27/12  1412   First MD Initiated Contact with Patient 11/27/12 1510      Chief Complaint  Patient presents with  . Foot Pain    (Consider location/radiation/quality/duration/timing/severity/associated sxs/prior treatment) HPI Melinda Lane is a 62 y.o. female who presents to the Emergency Department complaining of pruritic, painful rash and swelling on dorsal aspect of left foot onset 3 days. Has constant, throbbing pain to foot.  Pt denies recent injury, scratches, fever, recent illness, abdominal pain, nausea, vomiting, diarrhea, and chest pain. Pt reports rash has been spreading over last 2 days. Pt reports trying neosporin with no relief of symptoms. Pt reports hx of cellulitis on face. Pt reports tetanus is not up to date   Past Medical History  Diagnosis Date  . Hypertension   . 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. Hypothyroidism     Past Surgical History  Procedure Laterality Date  . Appendectomy    . Tonsillectomy    . Cholecystectomy    . Ectopic pregnancy surgery    . Knee surgery      No family history on file.  History  Substance Use Topics  . Smoking status: Never Smoker   . Smokeless tobacco: Not on file  . Alcohol Use: Yes    OB History   Grav Para Term Preterm Abortions TAB SAB Ect Mult Living                  Review of Systems  Constitutional: Negative.   HENT: Negative.   Respiratory: Negative.   Cardiovascular: Negative.   Gastrointestinal: Negative.   Musculoskeletal: Negative.        Left foot pain and swelling   Skin: Negative.   Neurological: Negative.   Psychiatric/Behavioral: Negative.   All other systems reviewed and are negative.   A complete 10 system review of systems was obtained and all systems are negative except as  noted in the HPI and PMH.    Allergies  Zithromax  Home Medications   Current Outpatient Rx  Name  Route  Sig  Dispense  Refill  . amLODipine (NORVASC) 2.5 MG tablet   Oral   Take 2.5 mg by mouth daily.         Marland Kitchen aspirin 81 MG tablet   Oral   Take 81 mg by mouth daily.         . calcium carbonate 1250 MG capsule   Oral   Take 1,250 mg by mouth 2 (two) times daily with a meal.         . cholecalciferol (VITAMIN D) 1000 UNITS tablet   Oral   Take 1,000 Units by mouth daily.         . Multiple Vitamin (MULTIVITAMIN) tablet   Oral   Take 1 tablet by mouth daily.         . nitroGLYCERIN (NITROSTAT) 0.4 MG SL tablet   Sublingual   Place 1 tablet (0.4 mg total) under the tongue every 5 (five) minutes x 3 doses as needed for chest pain.   15 tablet   0   . Omega-3 Fatty Acids (FISH OIL PO)   Oral   Take 1 capsule by mouth daily.         . progesterone (PROMETRIUM) 200  MG capsule   Oral   Take 200 mg by mouth daily.         Marland Kitchen telmisartan (MICARDIS) 40 MG tablet   Oral   Take 40 mg by mouth daily.         . Thyroid, Pork, 15 MG CAPS   Oral   Take 1 capsule by mouth 2 (two) times daily.         . vitamin E 100 UNIT capsule   Oral   Take 100 Units by mouth daily.           BP 137/86  Pulse 78  Temp(Src) 98.1 F (36.7 C) (Oral)  Resp 20  Wt 135 lb (61.236 kg)  BMI 22.12 kg/m2  SpO2 100%  Physical Exam  Nursing note and vitals reviewed. Constitutional: She is oriented to person, place, and time. She appears well-developed and well-nourished.  HENT:  Head: Normocephalic and atraumatic.  Eyes: Pupils are equal, round, and reactive to light.  Neck: Normal range of motion. Neck supple.  Cardiovascular: Normal rate, regular rhythm and normal heart sounds.   Pulmonary/Chest: Effort normal and breath sounds normal. No respiratory distress. She has no wheezes. She has no rales. She exhibits no tenderness.  Abdominal: Soft. Bowel sounds are  normal. There is no tenderness. There is no rebound and no guarding.  Musculoskeletal: Normal range of motion. She exhibits no edema.       Left foot: She exhibits normal capillary refill.  Tiny vesicle b/w 4th and 5th digits of left foot with redness involving 4cm of the dorsum of left foot. No extension up the leg. No induration or fluctuance. No wounds identified.    Lymphadenopathy:    She has no cervical adenopathy.  Neurological: She is alert and oriented to person, place, and time.  Skin: Skin is warm and dry. No rash noted.  Psychiatric: She has a normal mood and affect.    ED Course  Procedures (including critical care time) Medications  TDaP (BOOSTRIX) injection 0.5 mL (0.5 mLs Intramuscular Given 11/27/12 1530)    Labs Reviewed - No data to display No results found.   1. Insect bite   2. Cellulitis       MDM  Pt with redness to left foot.  Feel that this is likely an inflammatory response, possibly from an insect bite.  The area is not hot to touch.  No extension up the leg.  However, given that the redness is continuing to spread, will cover with abx in addition to steroids.  TDAP updated.   I personally performed the services described in this documentation, which was scribed in my presence.  The recorded information has been reviewed and considered.       Rolan Bucco, MD 11/27/12 [phone]

## 2012-11-27 NOTE — ED Notes (Signed)
Left foot is red, hot, swollen and painful x 3 days. Hx of cellulitis.

## 2013-02-17 DIAGNOSIS — F4322 Adjustment disorder with anxiety: Secondary | ICD-10-CM

## 2013-02-17 HISTORY — DX: Adjustment disorder with anxiety: F43.22

## 2013-09-20 ENCOUNTER — Telehealth: Payer: Self-pay | Admitting: *Deleted

## 2013-09-20 NOTE — Telephone Encounter (Signed)
Pt states her feet are really itchy and had previously been prescribe [employer], is using that now but still itches.  I offered pt an appt, but she states she needs to wait a little bit, what else could she use, wears boots a lot and her feet sweat.  I told her to change her shoes/boots and allow them to a dry, spray with antifungal spray or Lysol periodically.  I instructed pt to wear cotton socks, don't put wet feet into shoes.  Pt agreed and states will make an appt if itching continues.

## 2013-09-25 ENCOUNTER — Ambulatory Visit: Payer: Self-pay | Admitting: Podiatry

## 2014-04-18 ENCOUNTER — Ambulatory Visit: Payer: Self-pay | Admitting: Gynecology

## 2014-05-07 ENCOUNTER — Ambulatory Visit (INDEPENDENT_AMBULATORY_CARE_PROVIDER_SITE_OTHER): Payer: BC Managed Care – PPO | Admitting: Gynecology

## 2014-05-07 ENCOUNTER — Encounter: Payer: Self-pay | Admitting: Gynecology

## 2014-05-07 ENCOUNTER — Other Ambulatory Visit (HOSPITAL_COMMUNITY)
Admission: RE | Admit: 2014-05-07 | Discharge: 2014-05-07 | Disposition: A | Discharge status: Home / Self Care | Payer: BC Managed Care – PPO | Source: Ambulatory Visit | Attending: Gynecology | Admitting: Gynecology

## 2014-05-07 VITALS — BP 130/82 | Ht 65.0 in | Wt 136.0 lb

## 2014-05-07 DIAGNOSIS — M899 Disorder of bone, unspecified: Secondary | ICD-10-CM

## 2014-05-07 DIAGNOSIS — Z1151 Encounter for screening for human papillomavirus (HPV): Secondary | ICD-10-CM | POA: Diagnosis present

## 2014-05-07 DIAGNOSIS — Z01419 Encounter for gynecological examination (general) (routine) without abnormal findings: Secondary | ICD-10-CM | POA: Diagnosis present

## 2014-05-07 DIAGNOSIS — M8580 Other specified disorders of bone density and structure, unspecified site: Secondary | ICD-10-CM

## 2014-05-07 DIAGNOSIS — N951 Menopausal and female climacteric states: Secondary | ICD-10-CM

## 2014-05-07 DIAGNOSIS — Z780 Asymptomatic menopausal state: Secondary | ICD-10-CM

## 2014-05-07 DIAGNOSIS — M949 Disorder of cartilage, unspecified: Secondary | ICD-10-CM

## 2014-05-07 NOTE — Patient Instructions (Signed)
Bone Densitometry Bone densitometry is a special X-ray that measures your bone density and can be used to help predict your risk of bone fractures. This test is used to determine bone mineral content and density to diagnose osteoporosis. Osteoporosis is the loss of bone that may cause the bone to become weak. Osteoporosis commonly occurs in women entering menopause. However, it may be found in men and in people with other diseases. PREPARATION FOR TEST No preparation necessary. WHO SHOULD BE TESTED?  All women older than 44.  Postmenopausal women (50 to 58) with risk factors for osteoporosis.  People with a previous fracture caused by normal activities.  People with a small body frame (less than 127 poundsor a body mass index [BMI] of less than 21).  People who have a parent with a hip fracture or history of osteoporosis.  People who smoke.  People who have rheumatoid arthritis.  Anyone who engages in excessive alcohol use (more than 3 drinks most days).  Women who experience early menopause. WHEN SHOULD YOU BE RETESTED? Current guidelines suggest that you should wait at least 2 years before doing a bone density test again if your first test was normal.Recent studies indicated that women with normal bone density may be able to wait a few years before needing to repeat a bone density test. You should discuss this with your caregiver.  NORMAL FINDINGS   Normal: less than standard deviation below normal (greater than -1).  Osteopenia: 1 to 2.5 standard deviations below normal (-1 to -2.5).  Osteoporosis: greater than 2.5 standard deviations below normal (less than -2.5). Test results are reported as a "T score" and a "Z score."The T score is a number that compares your bone density with the bone density of healthy, young women.The Z score is a number that compares your bone density with the scores of women who are the same age, gender, and race.  Ranges for normal findings may vary  among different laboratories and hospitals. You should always check with your doctor after having lab work or other tests done to discuss the meaning of your test results and whether your values are considered within normal limits. MEANING OF TEST  Your caregiver will go over the test results with you and discuss the importance and meaning of your results, as well as treatment options and the need for additional tests if necessary. OBTAINING THE TEST RESULTS It is your responsibility to obtain your test results. Ask the lab or department performing the test when and how you will get your results. Document Released: 09/21/2004 Document Revised: 11/22/2011 Document Reviewed: 10/14/2010 [facility] Patient Information 2015 [place], [place]. This information is not intended to replace advice given to you by your health care provider. Make sure you discuss any questions you have with your health care provider. Breast Self-Awareness Practicing breast self-awareness may pick up problems early, prevent significant medical complications, and possibly save your life. By practicing breast self-awareness, you can become familiar with how your breasts look and feel and if your breasts are changing. This allows you to notice changes early. It can also offer you some reassurance that your breast health is good. One way to learn what is normal for your breasts and whether your breasts are changing is to do a breast self-exam. If you find a lump or something that was not present in the past, it is best to contact your caregiver right away. Other findings that should be evaluated by your caregiver include nipple discharge, especially if it  is bloody; skin changes or reddening; areas where the skin seems to be pulled in (retracted); or new lumps and bumps. Breast pain is seldom associated with cancer (malignancy), but should also be evaluated by a caregiver. HOW TO PERFORM A BREAST SELF-EXAM The best time to examine your  breasts is 5-7 days after your menstrual period is over. During menstruation, the breasts are lumpier, and it may be more difficult to pick up changes. If you do not menstruate, have reached menopause, or had your uterus removed (hysterectomy), you should examine your breasts at regular intervals, such as monthly. If you are breastfeeding, examine your breasts after a feeding or after using a breast pump. Breast implants do not decrease the risk for lumps or tumors, so continue to perform breast self-exams as recommended. Talk to your caregiver about how to determine the difference between the implant and breast tissue. Also, talk about the amount of pressure you should use during the exam. Over time, you will become more familiar with the variations of your breasts and more comfortable with the exam. A breast self-exam requires you to remove all your clothes above the waist. 1. Look at your breasts and nipples. Stand in front of a mirror in a room with good lighting. With your hands on your hips, push your hands firmly downward. Look for a difference in shape, contour, and size from one breast to the other (asymmetry). Asymmetry includes puckers, dips, or bumps. Also, look for skin changes, such as reddened or scaly areas on the breasts. Look for nipple changes, such as discharge, dimpling, repositioning, or redness. 2. Carefully feel your breasts. This is best done either in the shower or tub while using soapy water or when flat on your back. Place the arm (on the side of the breast you are examining) above your head. Use the pads (not the fingertips) of your three middle fingers on your opposite hand to feel your breasts. Start in the underarm area and use  inch (2 cm) overlapping circles to feel your breast. Use 3 different levels of pressure (light, medium, and firm pressure) at each circle before moving to the next circle. The light pressure is needed to feel the tissue closest to the skin. The medium  pressure will help to feel breast tissue a little deeper, while the firm pressure is needed to feel the tissue close to the ribs. Continue the overlapping circles, moving downward over the breast until you feel your ribs below your breast. Then, move one finger-width towards the center of the body. Continue to use the  inch (2 cm) overlapping circles to feel your breast as you move slowly up toward the collar bone (clavicle) near the base of the neck. Continue the up and down exam using all 3 pressures until you reach the middle of the chest. Do this with each breast, carefully feeling for lumps or changes. 3.  Keep a written record with breast changes or normal findings for each breast. By writing this information down, you do not need to depend only on memory for size, tenderness, or location. Write down where you are in your menstrual cycle, if you are still menstruating. Breast tissue can have some lumps or thick tissue. However, see your caregiver if you find anything that concerns you.  SEEK MEDICAL CARE IF:  You see a change in shape, contour, or size of your breasts or nipples.   You see skin changes, such as reddened or scaly areas on the breasts or  nipples.   You have an unusual discharge from your nipples.   You feel a new lump or unusually thick areas.  Document Released: 08/30/2005 Document Revised: 08/16/2012 Document Reviewed: 12/15/2011 [facility] Patient Information 2015 [place], [place]. This information is not intended to replace advice given to you by your health care provider. Make sure you discuss any questions you have with your health care provider.

## 2014-05-07 NOTE — Progress Notes (Signed)
Melinda Lane [birth year] [id number] History:  Patient is a 63 year old who presented to the office for her annual gynecological exam. Patient has not been seen since 2011. She is receiving topical testosterone and progesterone (Bioidentical  from another provider). She denies any vaginal bleeding. Review of her record indicated she has had a history of silicone implants in the past. She is not compatible mammogram since 2004. She had a bone density study in 2011 whereby at the lowest T score was -1.3 at the left femoral neck with normal FRAX Anaylsis. She reports a normal colonoscopy back in 2011. Her PCP is Dr. Blenda Nicely who has been doing her blood work.  Past medical history,surgical history, family history and social history were all reviewed and documented in the EPIC chart.  Gynecologic History No LMP recorded. Patient is postmenopausal. Contraception: post menopausal status Last Pap: 2011. Results were: normal Last mammogram: 2012. Results were: normal  Obstetric History OB History  Gravida Para Term Preterm AB SAB TAB Ectopic Multiple Living  4 2   2 1  1  2     # Outcome Date GA Lbr Len/2nd Weight Sex Delivery Anes PTL Lv  4 ECT           3 SAB           2 PAR           1 PAR                ROS: A ROS was performed and pertinent positives and negatives are included in the history.  GENERAL: No fevers or chills. HEENT: No change in vision, no earache, sore throat or sinus congestion. NECK: No pain or stiffness. CARDIOVASCULAR: No chest pain or pressure. No palpitations. PULMONARY: No shortness of breath, cough or wheeze. GASTROINTESTINAL: No abdominal pain, nausea, vomiting or diarrhea, melena or bright red blood per rectum. GENITOURINARY: No urinary frequency, urgency, hesitancy or dysuria. MUSCULOSKELETAL: No joint or muscle pain, no back pain, no recent trauma. DERMATOLOGIC: No rash, no itching, no lesions. ENDOCRINE: No polyuria, polydipsia, no heat or cold intolerance. No recent  change in weight. HEMATOLOGICAL: No anemia or easy bruising or bleeding. NEUROLOGIC: No headache, seizures, numbness, tingling or weakness. PSYCHIATRIC: No depression, no loss of interest in normal activity or change in sleep pattern.     Exam: chaperone present  BP 130/82  Ht 5\' 5"  (1.651 m)  Wt 136 lb (61.689 kg)  BMI 22.63 kg/m2  Body mass index is 22.63 kg/(m^2).  General appearance : Well developed well nourished female. No acute distress HEENT: Neck supple, trachea midline, no carotid bruits, no thyroidmegaly Lungs: Clear to auscultation, no rhonchi or wheezes, or rib retractions  Heart: Regular rate and rhythm, no murmurs or gallops Breast:Examined in sitting and supine position were symmetrical in appearance, no palpable masses or tenderness,  no skin retraction, no nipple inversion, no nipple discharge, no skin discoloration, no axillary or supraclavicular lymphadenopathy Abdomen: no palpable masses or tenderness, no rebound or guarding Extremities: no edema or skin discoloration or tenderness  Pelvic:  Bartholin, Urethra, Skene Glands: Within normal limits             Vagina: No gross lesions or discharge  Cervix: No gross lesions or discharge  Uterus  anteverted, normal size, shape and consistency, non-tender and mobile  Adnexa  Without masses or tenderness  Anus and perineum  normal   Rectovaginal  normal sphincter tone without palpated masses or tenderness  Hemoccult PCP we'll provide     Assessment/Plan:  63 y.o. female for annual exam using Bioidentical in the hormones (testosterone and progesterone being prescribed a different provider) patient denies any vaginal bleeding. Pap smear was done today. She was provided with requisition to schedule her overdue mammogram. We discussed importance of monthly breast exam. She will also schedule a bone density study next few weeks here in the office. We discussed importance of calcium and vitamin D and regular  exercise for osteoporosis prevention. Patient's PCP will be drawn her blood work. Patient's vaccinations are up-to-date.  Note: This dictation was prepared with  Dragon/digital dictation along withSmart phrase technology. Any transcriptional errors that result from this process are unintentional.   Terrance Mass MD, 3:47 PM 05/07/2014

## 2014-05-07 NOTE — Addendum Note (Signed)
Addended by: Thurnell Garbe A on: 05/07/2014 03:55 PM   Modules accepted: Orders

## 2014-05-09 ENCOUNTER — Encounter: Payer: Self-pay | Admitting: Obstetrics and Gynecology

## 2014-05-09 LAB — CYTOLOGY - PAP

## 2014-07-15 ENCOUNTER — Ambulatory Visit (INDEPENDENT_AMBULATORY_CARE_PROVIDER_SITE_OTHER): Payer: BC Managed Care – PPO

## 2014-07-15 ENCOUNTER — Encounter: Payer: Self-pay | Admitting: Gynecology

## 2014-07-15 DIAGNOSIS — Z780 Asymptomatic menopausal state: Secondary | ICD-10-CM

## 2014-07-15 DIAGNOSIS — M8580 Other specified disorders of bone density and structure, unspecified site: Secondary | ICD-10-CM

## 2014-11-12 ENCOUNTER — Emergency Department (HOSPITAL_BASED_OUTPATIENT_CLINIC_OR_DEPARTMENT_OTHER)
Admission: EM | Admit: 2014-11-12 | Discharge: 2014-11-12 | Disposition: A | Discharge status: Home / Self Care | Payer: BLUE CROSS/BLUE SHIELD | Attending: Emergency Medicine | Admitting: Emergency Medicine

## 2014-11-12 ENCOUNTER — Emergency Department (HOSPITAL_BASED_OUTPATIENT_CLINIC_OR_DEPARTMENT_OTHER): Payer: BLUE CROSS/BLUE SHIELD

## 2014-11-12 ENCOUNTER — Encounter (HOSPITAL_BASED_OUTPATIENT_CLINIC_OR_DEPARTMENT_OTHER): Payer: Self-pay | Admitting: Intensive Care

## 2014-11-12 DIAGNOSIS — M542 Cervicalgia: Secondary | ICD-10-CM | POA: Insufficient documentation

## 2014-11-12 DIAGNOSIS — M791 Myalgia: Secondary | ICD-10-CM | POA: Diagnosis not present

## 2014-11-12 DIAGNOSIS — R32 Unspecified urinary incontinence: Secondary | ICD-10-CM

## 2014-11-12 DIAGNOSIS — R519 Headache, unspecified: Secondary | ICD-10-CM

## 2014-11-12 DIAGNOSIS — Z79899 Other long term (current) drug therapy: Secondary | ICD-10-CM | POA: Diagnosis not present

## 2014-11-12 DIAGNOSIS — Z7952 Long term (current) use of systemic steroids: Secondary | ICD-10-CM | POA: Insufficient documentation

## 2014-11-12 DIAGNOSIS — Z87448 Personal history of other diseases of urinary system: Secondary | ICD-10-CM | POA: Diagnosis not present

## 2014-11-12 DIAGNOSIS — Z7982 Long term (current) use of aspirin: Secondary | ICD-10-CM | POA: Insufficient documentation

## 2014-11-12 DIAGNOSIS — I10 Essential (primary) hypertension: Secondary | ICD-10-CM | POA: Insufficient documentation

## 2014-11-12 DIAGNOSIS — E041 Nontoxic single thyroid nodule: Secondary | ICD-10-CM | POA: Diagnosis not present

## 2014-11-12 DIAGNOSIS — R51 Headache: Secondary | ICD-10-CM | POA: Insufficient documentation

## 2014-11-12 DIAGNOSIS — J029 Acute pharyngitis, unspecified: Secondary | ICD-10-CM | POA: Diagnosis not present

## 2014-11-12 DIAGNOSIS — E039 Hypothyroidism, unspecified: Secondary | ICD-10-CM | POA: Diagnosis not present

## 2014-11-12 HISTORY — DX: Unspecified urinary incontinence: R32

## 2014-11-12 LAB — URINALYSIS, ROUTINE W REFLEX MICROSCOPIC
Bilirubin Urine: NEGATIVE
GLUCOSE, UA: NEGATIVE mg/dL
Hgb urine dipstick: NEGATIVE
Ketones, ur: NEGATIVE mg/dL
Nitrite: NEGATIVE
PH: 6 (ref 5.0–8.0)
Protein, ur: NEGATIVE mg/dL
SPECIFIC GRAVITY, URINE: 1.003 — AB (ref 1.005–1.030)
Urobilinogen, UA: 0.2 mg/dL (ref 0.0–1.0)

## 2014-11-12 LAB — BASIC METABOLIC PANEL
ANION GAP: 2 — AB (ref 5–15)
BUN: 17 mg/dL (ref 6–23)
CALCIUM: 8.3 mg/dL — AB (ref 8.4–10.5)
CO2: 27 mmol/L (ref 19–32)
CREATININE: 0.92 mg/dL (ref 0.50–1.10)
Chloride: 103 mmol/L (ref 96–112)
GFR calc Af Amer: 76 mL/min — ABNORMAL LOW (ref >90)
GFR, EST NON AFRICAN AMERICAN: 66 mL/min — AB (ref >90)
Glucose, Bld: 112 mg/dL — ABNORMAL HIGH (ref 70–99)
POTASSIUM: 3.4 mmol/L — AB (ref 3.5–5.1)
SODIUM: 132 mmol/L — AB (ref 135–145)

## 2014-11-12 LAB — CBC
HCT: 40.9 % (ref 36.0–46.0)
HEMOGLOBIN: 14 g/dL (ref 12.0–15.0)
MCH: 31 pg (ref 26.0–34.0)
MCHC: 34.2 g/dL (ref 30.0–36.0)
MCV: 90.5 fL (ref 78.0–100.0)
PLATELETS: 291 10*3/uL (ref 150–400)
RBC: 4.52 MIL/uL (ref 3.87–5.11)
RDW: 14.9 % (ref 11.5–15.5)
WBC: 10.3 10*3/uL (ref 4.0–10.5)

## 2014-11-12 LAB — URINE MICROSCOPIC-ADD ON

## 2014-11-12 LAB — RAPID STREP SCREEN (MED CTR MEBANE ONLY): STREPTOCOCCUS, GROUP A SCREEN (DIRECT): NEGATIVE

## 2014-11-12 MED ORDER — HYDROCODONE-ACETAMINOPHEN 5-325 MG PO TABS
1.0000 | ORAL_TABLET | Freq: Once | ORAL | Status: AC
  Administered 2014-11-12: 1 via ORAL
  Filled 2014-11-12: qty 1

## 2014-11-12 MED ORDER — TRAMADOL HCL 50 MG PO TABS
50.0000 mg | ORAL_TABLET | Freq: Four times a day (QID) | ORAL | Status: DC | PRN
Start: 2014-11-12 — End: 2014-11-15

## 2014-11-12 MED ORDER — IBUPROFEN 600 MG PO TABS: 600.0000 mg | ORAL_TABLET | Freq: Four times a day (QID) | ORAL | Status: DC | PRN

## 2014-11-12 NOTE — Discharge Instructions (Signed)
Ibuprofen and ultram for headache. Rest. Drink plenty of fluids. Follow up with neurology as referred. Follow up with urology. Return if numbness, weakness in legs. If develop fever. If develop worsening headache, neck pain, back pain, incontinence of stool.   General Headache Without Cause A headache is pain or discomfort felt around the head or neck area. The specific cause of a headache may not be found. There are many causes and types of headaches. A few common ones are:  Tension headaches.  Migraine headaches.  Cluster headaches.  Chronic daily headaches. HOME CARE INSTRUCTIONS   Keep all follow-up appointments with your caregiver or any specialist referral.  Only take over-the-counter or prescription medicines for pain or discomfort as directed by your caregiver.  Lie down in a dark, quiet room when you have a headache.  Keep a headache journal to find out what may trigger your migraine headaches. For example, write down:  What you eat and drink.  How much sleep you get.  Any change to your diet or medicines.  Try massage or other relaxation techniques.  Put ice packs or heat on the head and neck. Use these 3 to 4 times per day for 15 to 20 minutes each time, or as needed.  Limit stress.  Sit up straight, and do not tense your muscles.  Quit smoking if you smoke.  Limit alcohol use.  Decrease the amount of caffeine you drink, or stop drinking caffeine.  Eat and sleep on a regular schedule.  Get 7 to 9 hours of sleep, or as recommended by your caregiver.  Keep lights dim if bright lights bother you and make your headaches worse. SEEK MEDICAL CARE IF:   You have problems with the medicines you were prescribed.  Your medicines are not working.  You have a change from the usual headache.  You have nausea or vomiting. SEEK IMMEDIATE MEDICAL CARE IF:   Your headache becomes severe.  You have a fever.  You have a stiff neck.  You have loss of  vision.  You have muscular weakness or loss of muscle control.  You start losing your balance or have trouble walking.  You feel faint or pass out.  You have severe symptoms that are different from your first symptoms. MAKE SURE YOU:   Understand these instructions.  Will watch your condition.  Will get help right away if you are not doing well or get worse. Document Released: 08/30/2005 Document Revised: 11/22/2011 Document Reviewed: 09/15/2011 [facility] Patient Information 2015 [place], [place]. This information is not intended to replace advice given to you by your health care provider. Make sure you discuss any questions you have with your health care provider.

## 2014-11-12 NOTE — ED Provider Notes (Signed)
CSN: [id number]     Arrival date & time 11/12/14  1637 History   First MD Initiated Contact with Patient 11/12/14 1702     Chief Complaint  Patient presents with  . Headache     (Consider location/radiation/quality/duration/timing/severity/associated sxs/prior Treatment) HPI Melinda Lane is a 64 y.o. female with history of hypertension, hypothyroidism, presents to emergency department complaining of headache, sore throat, he bladder incontinence. Patient states that she has been under a lot of stress, states her mother is in the hospital, and had to stay with her and sleep in a recliner this weekend. States 2 days ago after washing her hair she flipped her head forward and felt a sudden rush of what felt like fluid going into her head which was accompanied by severe headache. States she sat down, headache lasted approximately 3 minutes, associated with dizziness, and resolved on its own after 3 minutes. Patient states she did not have a headache for the rest of the day and felt fine. She states yesterday she also felt good. This morning she developed dull headache in the back of her head radiating to the bilateral neck, left worst than right, and sore throat, body aches. States flulike symptoms. She also reports new onset of urinary incontinence that started right after her episode of headache 2 days ago. States she has never had any incontinence other than when jumping on a trampoline in the past. States she does have a urologist that she has seen a past for interstitial cystitis. She states she saw them today, had urinalysis done that was normal, has an appointment for next week to have a study done.She states that she has constant "dripping of urine without being aware of it." She states she has had to use pads since this started. She denies any other neurological symptoms. Specifically she denies any visual changes, dizziness, nausea or vomiting, weakness in extremities, numbness in upper or lower  extremities, incontinence of bowel. She denies any fever. No medications taken prior to coming in.  Past Medical History  Diagnosis Date  . Hypertension   . 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. Hypothyroidism    Past Surgical History  Procedure Laterality Date  . Appendectomy    . Tonsillectomy    . Cholecystectomy    . Ectopic pregnancy surgery    . Knee surgery    . Augmentation mammaplasty      saline - 2015   Family History  Problem Relation Age of Onset  . Hypertension Mother   . Heart disease Mother   . Hypertension Father   . Heart disease Father    History  Substance Use Topics  . Smoking status: Never Smoker   . Smokeless tobacco: Not on file  . Alcohol Use: Yes   OB History    Gravida Para Term Preterm AB TAB SAB Ectopic Multiple Living   4 2   2  1 1  2      Review of Systems  Constitutional: Negative for fever and chills.  Respiratory: Negative for cough, chest tightness and shortness of breath.   Cardiovascular: Negative for chest pain, palpitations and leg swelling.  Gastrointestinal: Negative for nausea, vomiting, abdominal pain and diarrhea.  Genitourinary: Negative for dysuria, urgency, frequency, hematuria, flank pain, difficulty urinating and pelvic pain.  Musculoskeletal: Positive for neck pain. Negative for myalgias, arthralgias and neck stiffness.  Skin: Negative for rash.  Neurological: Positive for headaches. Negative for dizziness, weakness, light-headedness and numbness.  All other systems reviewed and are  negative.     Allergies  Zithromax  Home Medications   Prior to Admission medications   Medication Sig Start Date End Date Taking? Authorizing Provider  amLODipine (NORVASC) 2.5 MG tablet Take 2.5 mg by mouth daily.    Historical Provider, MD  aspirin 81 MG tablet Take 81 mg by mouth daily.    Historical Provider, MD  calcium carbonate 1250 MG capsule Take 1,250 mg by mouth 2 (two) times daily with a meal.    Historical Provider, MD   cholecalciferol (VITAMIN D) 1000 UNITS tablet Take 1,000 Units by mouth daily.    Historical Provider, MD  Estradiol-Estriol-Progesterone (BIEST/PROGESTERONE) CREA Place onto the skin.    Historical Provider, MD  ferrous sulfate 325 (65 FE) MG tablet Take 325 mg by mouth daily with breakfast.    Historical Provider, MD  Multiple Vitamin (MULTIVITAMIN) tablet Take 1 tablet by mouth daily.    Historical Provider, MD  nitroGLYCERIN (NITROSTAT) 0.4 MG SL tablet Place 1 tablet (0.4 mg total) under the tongue every 5 (five) minutes x 3 doses as needed for chest pain. 04/12/12 04/12/13  Clanford Marisa Hua, MD  Omega-3 Fatty Acids (FISH OIL PO) Take 1 capsule by mouth daily.    Historical Provider, MD  predniSONE (DELTASONE) 10 MG tablet Take 4 tablets (40 mg total) by mouth daily. 11/27/12   Malvin Johns, MD  progesterone (ENDOMETRIN) 100 MG vaginal insert Place 100 mg vaginally 2 (two) times daily.    Historical Provider, MD  progesterone (PROMETRIUM) 200 MG capsule Take 200 mg by mouth daily.    Historical Provider, MD  telmisartan (MICARDIS) 40 MG tablet Take 40 mg by mouth daily.    Historical Provider, MD  testosterone (ANDROGEL) 50 MG/5GM (1%) GEL Place 5 g onto the skin daily.    Historical Provider, MD  Thyroid, Pork, 15 MG CAPS Take 1 capsule by mouth 2 (two) times daily.    Historical Provider, MD  vitamin E 100 UNIT capsule Take 100 Units by mouth daily.    Historical Provider, MD   BP 129/71 mmHg  Pulse 68  Temp(Src) 98 F (36.7 C) (Oral)  Resp 16  SpO2 97% Physical Exam  Constitutional: She is oriented to person, place, and time. She appears well-developed and well-nourished. No distress.  HENT:  Head: Normocephalic.  Right Ear: External ear normal.  Left Ear: External ear normal.  Nose: Nose normal.  Mouth/Throat: Oropharynx is clear and moist. No oropharyngeal exudate.  Eyes: Conjunctivae are normal. Pupils are equal, round, and reactive to light.  Neck: Normal range of motion.  Neck supple.  No ttp. Left submandibular lymphadenopathy, ttp.  Cardiovascular: Normal rate, regular rhythm and normal heart sounds.   Pulmonary/Chest: Effort normal and breath sounds normal. No respiratory distress. She has no wheezes. She has no rales.  Abdominal: Soft. Bowel sounds are normal. She exhibits no distension. There is no tenderness. There is no rebound.  Musculoskeletal: She exhibits no edema.  Neurological: She is alert and oriented to person, place, and time. No cranial nerve deficit. Coordination normal.  5/5 and equal upper and lower extremity strength bilaterally. Equal grip strength bilaterally. Normal finger to nose and heel to shin. No pronator drift. Patellar reflexes 2+ gait is normal. Sensation of LE intact bilaterally. DP pulses normal bilaterally    Skin: Skin is warm and dry.  Psychiatric: She has a normal mood and affect. Her behavior is normal.  Nursing note and vitals reviewed.   ED Course  Procedures (including critical  care time) Labs Review Labs Reviewed  URINALYSIS, ROUTINE W REFLEX MICROSCOPIC - Abnormal; Notable for the following:    Specific Gravity, Urine 1.003 (*)    Leukocytes, UA TRACE (*)    All other components within normal limits  BASIC METABOLIC PANEL - Abnormal; Notable for the following:    Sodium 132 (*)    Potassium 3.4 (*)    Glucose, Bld 112 (*)    Calcium 8.3 (*)    GFR calc non Af Amer 66 (*)    GFR calc Af Amer 76 (*)    Anion gap 2 (*)    All other components within normal limits  RAPID STREP SCREEN  CULTURE, GROUP A STREP  URINE MICROSCOPIC-ADD ON  CBC    Imaging Review Ct Head Wo Contrast  11/12/2014   CLINICAL DATA:  Headache for 2 days  EXAM: CT HEAD WITHOUT CONTRAST  TECHNIQUE: Contiguous axial images were obtained from the base of the skull through the vertex without intravenous contrast.  COMPARISON:  September 13, 2007  FINDINGS: The ventricles are normal in size and configuration. There is no mass, hemorrhage,  extra-axial fluid collection, or midline shift. Gray-white compartments are normal. No acute infarct apparent. There is stable pineal calcification. Bony calvarium appears intact. The mastoid air cells are clear.  IMPRESSION: No intracranial mass, hemorrhage, or focal gray -white compartment lesion/acute appearing infarct. Stable pineal calcification. Stability of this calcification over several years is consistent with benign etiology.   Electronically Signed   By: Lowella Grip III M.D.   On: 11/12/2014 18:50   Ct Cervical Spine Wo Contrast  11/12/2014   CLINICAL DATA:  Two day history of neck pain  EXAM: CT CERVICAL SPINE WITHOUT CONTRAST  TECHNIQUE: Multidetector CT imaging of the cervical spine was performed without intravenous contrast. Multiplanar CT image reconstructions were also generated.  COMPARISON:  None.  FINDINGS: There is no fracture or spondylolisthesis. Prevertebral soft tissues and predental space regions are normal. There is moderately severe disc space narrowing at C5-6 and C6-7. There are anterior osteophytes at C5, C6, and C7.  There is no disc extrusion or stenosis on this study. There is mild facet hypertrophy at C3-4 bilaterally. There is moderate facet hypertrophy at C4-5 on the left. There is mild facet hypertrophy at C5-6 on the right. There is mild facet hypertrophy at C6-7 on both sides. There is mild exit foraminal narrowing due to bony hypertrophy at C6-7 bilaterally and at C5-6 bilaterally. No nerve root edema or effacement.  There is a mass in the right lobe of the thyroid measuring 1.7 x 1.4 cm. There are several smaller masses in the left lobe.  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 No disc extrusion or stenosis. No fracture or spondylolisthesis.  Mass lesions in the thyroid with a dominant mass in the right lobe. Consider further evaluation with thyroid ultrasound. If patient is clinically hyperthyroid, consider nuclear medicine thyroid  uptake and scan. The mass in the right lobe may well warrant biopsy given its size.   Electronically Signed   By: Lowella Grip III M.D.   On: 11/12/2014 20:55     EKG Interpretation None      MDM   Final diagnoses:  Nonintractable headache, unspecified chronicity pattern, unspecified headache type  Urinary incontinence, unspecified incontinence type  Thyroid nodule   Patient with sudden onset of headache and feeling like fluid is filling up her head 2 days ago, the symptoms however went away  after 3 minutes. Today flulike symptoms, mild headache, sore throat. Patient is concerned because she also is having urinary incontinence which is new for her. Will get labs, UA, CT head.   9:13 PM Spoke with Dr. Janann Colonel, neurology on call, who recommended to get CT of c spine which is back normal other than some arthritic changes and thyroid nodules which pt states she is aware of. Pt states she will follow up with pcp. Pt has completely normal neurological exam other than bladder incontince, which makes me question if her headache and incontinence may be unrelated. She is in NAD. Non toxic appearing. NO other signs of cauda equina. At this time stable for d/c home from ER with neurology and urology follow up.   Filed Vitals:   11/12/14 1647 11/12/14 1909  BP: 153/94 129/71  Pulse: 74 68  Temp: 98 F (36.7 C)   TempSrc: Oral   Resp: 16 16  SpO2: 100% 97%       Renold Genta, PA-C 11/12/14 [place], MD 11/12/14 2240

## 2014-11-12 NOTE — ED Notes (Signed)
Pt states on Sunday she felt momentary rush of fluid throughout her head. Since then pt has had slight headache, sore throat, abd pain, and urinary incontinence

## 2014-11-12 NOTE — ED Notes (Signed)
Pt comes in today with a c/o headache. Pt states this started on Saturday. Pt states that on Sunday while drying her hair she felt fluid rush from the bottom of her head to the top. Pt states the pain was much worse than sinus pressure. Pt also c/o her "bladder dripping." Pt states she has never had this happen before. Pt states that her head is not hurting at this moment. Pt states that she feels aching and "flu like."

## 2014-11-13 ENCOUNTER — Telehealth: Payer: Self-pay | Admitting: Neurology

## 2014-11-13 NOTE — Telephone Encounter (Signed)
Patient's schedule with Dr. Krista Blue on 3/4 @ 11:45, ER referral for headaches and Incontinence.  Also has appointment with Urologist on 11/19/14 for Urodynamics testing to check nerves and muscles of bladder.  Questioning if she should see Urologist prior to seeing Dr. Krista Blue.  Please call and advise.

## 2014-11-13 NOTE — Telephone Encounter (Signed)
Spoke to pt - she will keep her appt as scheduled with Dr. Krista Blue.

## 2014-11-15 ENCOUNTER — Ambulatory Visit (INDEPENDENT_AMBULATORY_CARE_PROVIDER_SITE_OTHER): Payer: BLUE CROSS/BLUE SHIELD | Admitting: Neurology

## 2014-11-15 ENCOUNTER — Encounter: Payer: Self-pay | Admitting: Neurology

## 2014-11-15 VITALS — BP 147/93 | HR 66 | Ht 65.0 in | Wt 138.0 lb

## 2014-11-15 DIAGNOSIS — R519 Headache, unspecified: Secondary | ICD-10-CM

## 2014-11-15 DIAGNOSIS — R51 Headache: Secondary | ICD-10-CM

## 2014-11-15 DIAGNOSIS — R32 Unspecified urinary incontinence: Secondary | ICD-10-CM

## 2014-11-15 HISTORY — DX: Headache, unspecified: R51.9

## 2014-11-15 HISTORY — DX: Unspecified urinary incontinence: R32

## 2014-11-15 LAB — CULTURE, GROUP A STREP: Strep A Culture: POSITIVE — AB

## 2014-11-15 NOTE — Progress Notes (Signed)
PATIENT: Melinda Lane DOB: 03/30/1951  HISTORICAL  Melinda Lane is 64 yo RH femal referred by her primary care physician Dr. Valora Piccolo for evaluation of acute onset headaches, urinary incontinence,  She had a history of hypertension, on amlodipine, Micardis treatment, hypothyroidism, on supplement, was diagnosed with renal artery stenosis around 2014 by [facility] physician, no intervention was given.  She denied previous history of headaches, she stayed at hospital overnight February 28th 2016 to take care of her mother, feeling exhausted, after bending down her neck to dry her hair in November 11 2014, she felt sudden onset fluid rushing through her vortex, severe headaches, lasting 3-4 minutes, tearing, headache quickly subsided, however in March first 2016, she developed acute onset of difficulty controlling her urine, she has constant urinary leakage, has to wear a pad, over the past few days, there was only mild improvement,  She denies gait difficulty, no visual loss, she presented to emergency room March first 2016, UA showed no signs of infection, normal CBC, BMP.  I have reviewed CT head without contrast, calcified pineal gland, no acute intracranial abnormality  CT cervcial. 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 No disc extrusion or stenosis. No fracture or spondylolisthesis.  She denies gait difficulty, constipations, she denied confusion   REVIEW OF SYSTEMS: Full 14 system review of systems performed and notable only for fever, chill, sore throat, incontinence, constipation, feeling hot, headaches, decreased energy  ALLERGIES: Allergies  Allergen Reactions  . Zithromax [Azithromycin] Rash    HOME MEDICATIONS: Current Outpatient Prescriptions  Medication Sig Dispense Refill  . amLODipine (NORVASC) 2.5 MG tablet Take 2.5 mg by mouth daily.    Marland Kitchen aspirin 81 MG tablet Take 81 mg by mouth daily.    . calcium carbonate 1250 MG capsule Take 1,250 mg by mouth 2 (two)  times daily with a meal.    . cholecalciferol (VITAMIN D) 1000 UNITS tablet Take 1,000 Units by mouth daily.    . Estradiol-Estriol-Progesterone (BIEST/PROGESTERONE) CREA Place onto the skin.    . ferrous sulfate 325 (65 FE) MG tablet Take 325 mg by mouth daily with breakfast.    . ibuprofen (ADVIL,MOTRIN) 600 MG tablet Take 1 tablet (600 mg total) by mouth every 6 (six) hours as needed. 30 tablet 0  . Multiple Vitamin (MULTIVITAMIN) tablet Take 1 tablet by mouth daily.    . Omega-3 Fatty Acids (FISH OIL PO) Take 1 capsule by mouth daily.    . progesterone (ENDOMETRIN) 100 MG vaginal insert Place 100 mg vaginally 2 (two) times daily.    . progesterone (PROMETRIUM) 200 MG capsule Take 200 mg by mouth daily.    Marland Kitchen telmisartan (MICARDIS) 40 MG tablet Take 40 mg by mouth daily.    Marland Kitchen testosterone (ANDROGEL) 50 MG/5GM (1%) GEL Place 5 g onto the skin daily.    . Thyroid, Pork, 15 MG CAPS Take 1 capsule by mouth 2 (two) times daily.    . vitamin E 100 UNIT capsule Take 100 Units by mouth daily.     No current facility-administered medications for this visit.    PAST MEDICAL HISTORY: Past Medical History  Diagnosis Date  . Hypertension   . 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. Hypothyroidism   . Headache     PAST SURGICAL HISTORY: Past Surgical History  Procedure Laterality Date  . Appendectomy    . Tonsillectomy    . Cholecystectomy    . Ectopic pregnancy surgery    . Knee surgery    .  Augmentation mammaplasty      saline - 2015    FAMILY HISTORY: Family History  Problem Relation Age of Onset  . Hypertension Mother   . Heart disease Mother   . Hypertension Father   . Heart disease Father     SOCIAL HISTORY:  History   Social History  . Marital Status: Married    Spouse Name: N/A  . Number of Children: 2  . Years of Education: HS   Occupational History  . Accounting    Social History Main Topics  . Smoking status: Never Smoker   . Smokeless tobacco: Not on file  . Alcohol  Use: 0.0 oz/week    0 Standard drinks or equivalent per week     Comment: One weekly - 2 glasses of wine  . Drug Use: No  . Sexual Activity: Yes   Other Topics Concern  . Not on file   Social History Narrative   Lives at home with her husband.   Right-handed.   3 cups caffeine daily     PHYSICAL EXAM   Filed Vitals:   11/15/14 1236  BP: 147/93  Pulse: 66  Height: 5\' 5"  (1.651 m)  Weight: 138 lb (62.596 kg)    Not recorded      Body mass index is 22.96 kg/(m^2).  PHYSICAL EXAMNIATION:  Gen: NAD, conversant, well nourised, obese, well groomed                     Cardiovascular: Regular rate rhythm, no peripheral edema, warm, nontender. Eyes: Conjunctivae clear without exudates or hemorrhage Neck: Supple, no carotid bruise. Pulmonary: Clear to auscultation bilaterally   NEUROLOGICAL EXAM:  MENTAL STATUS: Speech:    Speech is normal; fluent and spontaneous with normal comprehension.  Cognition:    The patient is oriented to person, place, and time;     recent and remote memory intact;     language fluent;     normal attention, concentration,     fund of knowledge.  CRANIAL NERVES: CN II: Visual fields are full to confrontation. Fundoscopic exam is normal with sharp discs and no vascular changes. Venous pulsations are present bilaterally. Pupils are 4 mm and briskly reactive to light. Visual acuity is 20/20 bilaterally. CN III, IV, VI: extraocular movement are normal. No ptosis. CN V: Facial sensation is intact to pinprick in all 3 divisions bilaterally. Corneal responses are intact.  CN VII: Face is symmetric with normal eye closure and smile. CN VIII: Hearing is normal to rubbing fingers CN IX, X: Palate elevates symmetrically. Phonation is normal. CN XI: Head turning and shoulder shrug are intact CN XII: Tongue is midline with normal movements and no atrophy.  MOTOR: There is no pronator drift of out-stretched arms. Muscle bulk and tone are normal. Muscle  strength is normal.   Shoulder abduction Shoulder external rotation Elbow flexion Elbow extension Wrist flexion Wrist extension Finger abduction Hip flexion Knee flexion Knee extension Ankle dorsi flexion Ankle plantar flexion  R 5 5 5 5 5 5 5 5 5 5 5 5   L 5 5 5 5 5 5 5 5 5 5 5 5     REFLEXES: Reflexes are 2+ and symmetric at the biceps, triceps, knees, and ankles. Plantar responses are flexor.  SENSORY: Light touch, pinprick, position sense, and vibration sense are intact in fingers and toes.  COORDINATION: Rapid alternating movements and fine finger movements are intact. There is no dysmetria on finger-to-nose and heel-knee-shin. There are no  abnormal or extraneous movements.   GAIT/STANCE: Posture is normal. Gait is steady with normal steps, base, arm swing, and turning. Heel and toe walking are normal. Tandem gait is normal.  Romberg is absent.   DIAGNOSTIC DATA (LABS, IMAGING, TESTING) - I reviewed patient records, labs, notes, testing and imaging myself where available.  Lab Results  Component Value Date   WBC 10.3 11/12/2014   HGB 14.0 11/12/2014   HCT 40.9 11/12/2014   MCV 90.5 11/12/2014   PLT 291 11/12/2014      Component Value Date/Time   NA 132* 11/12/2014 1752   K 3.4* 11/12/2014 1752   CL 103 11/12/2014 1752   CO2 27 11/12/2014 1752   GLUCOSE 112* 11/12/2014 1752   BUN 17 11/12/2014 1752   CREATININE 0.92 11/12/2014 1752   CALCIUM 8.3* 11/12/2014 1752   PROT 7.1 04/11/2012 2312   ALBUMIN 3.9 04/11/2012 2312   AST 28 04/11/2012 2312   ALT 16 04/11/2012 2312   ALKPHOS 78 04/11/2012 2312   BILITOT 0.7 04/11/2012 2312   GFRNONAA 66* 11/12/2014 1752   GFRAA 76* 11/12/2014 1752   Lab Results  Component Value Date   CHOL 172 04/12/2012   HDL 69 04/12/2012   LDLCALC 95 04/12/2012   TRIG 40 04/12/2012   CHOLHDL 2.5 04/12/2012   No results found for: HGBA1C No results found for: VITAMINB12 Lab Results  Component Value Date   TSH 0.178* 04/12/2012       ASSESSMENT AND PLAN  Melinda Lane is a 64 y.o. female  With sudden onset of headaches, urinary incontinence, essentially normal neurological examinations,  1. I am not sure exactly etiology, but differentiation diagnosis including bilateral parasagittal frontal lesion, 2, MRI of brain, MRA of the brain, to rule out aneurysm  3. Return to clinic after above study    Marcial Pacas, M.D. Ph.D.  [facility] [address], [place], [place] [address] Ph: [phone] Fax: [phone]

## 2014-11-18 ENCOUNTER — Ambulatory Visit (INDEPENDENT_AMBULATORY_CARE_PROVIDER_SITE_OTHER): Payer: BLUE CROSS/BLUE SHIELD | Admitting: Family Medicine

## 2014-11-18 ENCOUNTER — Telehealth: Payer: Self-pay | Admitting: Obstetrics & Gynecology

## 2014-11-18 VITALS — BP 135/89 | HR 60 | Temp 98.5°F | Resp 18 | Ht 65.0 in | Wt 136.0 lb

## 2014-11-18 DIAGNOSIS — N898 Other specified noninflammatory disorders of vagina: Secondary | ICD-10-CM | POA: Diagnosis not present

## 2014-11-18 LAB — POCT WET PREP WITH KOH
CLUE CELLS WET PREP PER HPF POC: NEGATIVE
KOH Prep POC: NEGATIVE
Trichomonas, UA: NEGATIVE
Yeast Wet Prep HPF POC: NEGATIVE

## 2014-11-18 MED ORDER — METRONIDAZOLE 500 MG PO TABS: 500.0000 mg | ORAL_TABLET | Freq: Two times a day (BID) | ORAL | Status: DC

## 2014-11-18 NOTE — Telephone Encounter (Signed)
Called pt with information on new pt appointment scheduling. Pt also interested in seeing Dr Sabra Heck - informed pt Dr Sabra Heck is not currently accepting new patients. Pt cannot wait until next appointments available in June. Suggested local urgent care. Pt agreeable to this option. Pt not scheduled at this time. Encounter closed.

## 2014-11-18 NOTE — Telephone Encounter (Signed)
Patient called. States she wants to see Dr  Sabra Heck because she is having vaginal discharge. She can be reached at home # until 1030. After 1030 call work #

## 2014-11-18 NOTE — Progress Notes (Signed)
Subjective:  This chart was scribed for Delman Cheadle MD,  by Tamsen Roers, at [facility].  This patient was seen in room 11 and the patient's care was started at 8:04 PM.    Patient ID: Melinda Lane, female    DOB: [birth year], 64 y.o.   MRN: [id number]  Chief Complaint  Patient presents with  . Vaginal Discharge    Vaginal Discharge The patient's primary symptoms include vaginal discharge. The patient's pertinent negatives include no pelvic pain. Associated symptoms include frequency, headaches and urgency. Pertinent negatives include no abdominal pain, chills, constipation, diarrhea, dysuria, fever, flank pain, nausea, rash or vomiting.    HPI Comments: Melinda Lane is a 64 y.o. female who presents to [facility] complaining of constant vaginal discharge (thin and watery with a yellow tinge) onset 6 days ago.  States she was leaking with urine onset 7 days ago.  Patient has been wearing depends since then. Patient went to urologist in [place] (tested her urine) and had her blood tested at med center in [place].     Patient notes that she had a bad headache onset 11 days ago.  Patient had a head CT and states that her results came back normal.    Patient saw her gynecologist Dr. Mauri Brooklyn august 2015 for annual exam. Patient is using bio-identical hormone therapy from a different provider - Tammy Worrell but no other abnormality at that time.    Past Medical History  Diagnosis Date  . Hypertension   . 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. Hypothyroidism   . Headache    Current Outpatient Prescriptions on File Prior to Visit  Medication Sig Dispense Refill  . amLODipine (NORVASC) 2.5 MG tablet Take 2.5 mg by mouth daily.    Marland Kitchen aspirin 81 MG tablet Take 81 mg by mouth daily.    . calcium carbonate 1250 MG capsule Take 1,250 mg by mouth 2 (two) times daily with a meal.    . cholecalciferol (VITAMIN D) 1000 UNITS tablet Take 1,000 Units by mouth daily.     . Estradiol-Estriol-Progesterone (BIEST/PROGESTERONE) CREA Place onto the skin.    . ferrous sulfate 325 (65 FE) MG tablet Take 325 mg by mouth daily with breakfast.    . ibuprofen (ADVIL,MOTRIN) 600 MG tablet Take 1 tablet (600 mg total) by mouth every 6 (six) hours as needed. 30 tablet 0  . Multiple Vitamin (MULTIVITAMIN) tablet Take 1 tablet by mouth daily.    . Omega-3 Fatty Acids (FISH OIL PO) Take 1 capsule by mouth daily.    . progesterone (ENDOMETRIN) 100 MG vaginal insert Place 100 mg vaginally 2 (two) times daily.    . progesterone (PROMETRIUM) 200 MG capsule Take 200 mg by mouth daily.    Marland Kitchen telmisartan (MICARDIS) 40 MG tablet Take 40 mg by mouth daily.    Marland Kitchen testosterone (ANDROGEL) 50 MG/5GM (1%) GEL Place 5 g onto the skin daily.    . Thyroid, Pork, 15 MG CAPS Take 1 capsule by mouth 2 (two) times daily.    . vitamin E 100 UNIT capsule Take 100 Units by mouth daily.     No current facility-administered medications on file prior to visit.   Allergies  Allergen Reactions  . Zithromax [Azithromycin] Rash      Review of Systems  Constitutional: Negative for fever, chills, activity change and appetite change.  Gastrointestinal: Negative for nausea, vomiting, abdominal pain, diarrhea and constipation.  Genitourinary: Positive for urgency, frequency and  vaginal discharge. Negative for dysuria, flank pain, decreased urine volume, vaginal bleeding, difficulty urinating, genital sores, vaginal pain, menstrual problem, pelvic pain and dyspareunia.  Skin: Negative for rash.  Neurological: Positive for headaches.  Hematological: Negative for adenopathy.       Objective:   Physical Exam  Constitutional: She is oriented to person, place, and time. She appears well-developed and well-nourished. No distress.  HENT:  Head: Normocephalic and atraumatic.  Eyes: Conjunctivae and EOM are normal.  Neck: Neck supple.  Cardiovascular: Normal rate.   Pulmonary/Chest: Effort normal. No  respiratory distress.  Genitourinary:  Labia majora and minora normal Small amounts of non tender erythema around vaginal introitus  Vaginal tissues in cervix pink and healthy as well as urethra  Moderate amount of thin yellow discharge present in vaginal vault  No discharge seen eminating from cervix or bladder with repeated coughing  Normal bi manual exam No cervical motion tenderness.   Musculoskeletal: Normal range of motion.  Neurological: She is alert and oriented to person, place, and time.  Skin: Skin is warm and dry.  Psychiatric: She has a normal mood and affect. Her behavior is normal.  Nursing note and vitals reviewed.   BP 135/89 mmHg  Pulse 60  Temp(Src) 98.5 F (36.9 C) (Oral)  Resp 18  Ht 5\' 5"  (1.651 m)  Wt 136 lb (61.689 kg)  BMI 22.63 kg/m2  SpO2 99%      Assessment & Plan:   Vaginal discharge - Plan: POCT Wet Prep with KOH, GC/Chlamydia Probe Amp, Trichomonas vaginalis, RNA Unknown etiology but rec empiric treatment with below - clinically looks like trich but pt feels confident in her monogamous relationship so highly unlikely.  Pt wondering if it could be due to her recent bio-identical hormone change - recently switched to pellet implants - so will f/u w/ that clinic to see if that is a possbility.  Meds ordered this encounter  Medications  . DISCONTD: metroNIDAZOLE (FLAGYL) 500 MG tablet    Sig: Take 1 tablet (500 mg total) by mouth 2 (two) times daily.    Dispense:  14 tablet    Refill:  0  . metroNIDAZOLE (FLAGYL) 500 MG tablet    Sig: Take 1 tablet (500 mg total) by mouth 2 (two) times daily.    Dispense:  14 tablet    Refill:  0    I personally performed the services described in this documentation, which was scribed in my presence. The recorded information has been reviewed and considered, and addended by me as needed.  Delman Cheadle, MD MPH   Results for orders placed or performed in visit on 11/18/14  GC/Chlamydia Probe Amp  Result Value  Ref Range   CT Probe RNA NEGATIVE    GC Probe RNA NEGATIVE   Trichomonas vaginalis, RNA  Result Value Ref Range   T vaginalis RNA Negative   POCT Wet Prep with KOH  Result Value Ref Range   Trichomonas, UA Negative    Clue Cells Wet Prep HPF POC neg    Epithelial Wet Prep HPF POC 3-5    Yeast Wet Prep HPF POC neg    Bacteria Wet Prep HPF POC 1+    RBC Wet Prep HPF POC 0-2    WBC Wet Prep HPF POC 10-20    KOH Prep POC Negative    i

## 2014-11-18 NOTE — Patient Instructions (Signed)
Bacterial Vaginosis Bacterial vaginosis is a vaginal infection that occurs when the normal balance of bacteria in the vagina is disrupted. It results from an overgrowth of certain bacteria. This is the most common vaginal infection in women of childbearing age. Treatment is important to prevent complications, especially in pregnant women, as it can cause a premature delivery. CAUSES  Bacterial vaginosis is caused by an increase in harmful bacteria that are normally present in smaller amounts in the vagina. Several different kinds of bacteria can cause bacterial vaginosis. However, the reason that the condition develops is not fully understood. RISK FACTORS Certain activities or behaviors can put you at an increased risk of developing bacterial vaginosis, including:  Having a new sex partner or multiple sex partners.  Douching.  Using an intrauterine device (IUD) for contraception. Women do not get bacterial vaginosis from toilet seats, bedding, swimming pools, or contact with objects around them. SIGNS AND SYMPTOMS  Some women with bacterial vaginosis have no signs or symptoms. Common symptoms include:  Grey vaginal discharge.  A fishlike odor with discharge, especially after sexual intercourse.  Itching or burning of the vagina and vulva.  Burning or pain with urination. DIAGNOSIS  Your health care provider will take a medical history and examine the vagina for signs of bacterial vaginosis. A sample of vaginal fluid may be taken. Your health care provider will look at this sample under a microscope to check for bacteria and abnormal cells. A vaginal pH test may also be done.  TREATMENT  Bacterial vaginosis may be treated with antibiotic medicines. These may be given in the form of a pill or a vaginal cream. A second round of antibiotics may be prescribed if the condition comes back after treatment.  HOME CARE INSTRUCTIONS   Only take over-the-counter or prescription medicines as  directed by your health care provider.  If antibiotic medicine was prescribed, take it as directed. Make sure you finish it even if you start to feel better.  Do not have sex until treatment is completed.  Tell all sexual partners that you have a vaginal infection. They should see their health care provider and be treated if they have problems, such as a mild rash or itching.  Practice safe sex by using condoms and only having one sex partner. SEEK MEDICAL CARE IF:   Your symptoms are not improving after 3 days of treatment.  You have increased discharge or pain.  You have a fever. MAKE SURE YOU:   Understand these instructions.  Will watch your condition.  Will get help right away if you are not doing well or get worse. FOR MORE INFORMATION  Centers for Disease Control and Prevention, Division of STD Prevention: AppraiserFraud.fi American Sexual Health Association (ASHA): www.ashastd.org  Document Released: 08/30/2005 Document Revised: 06/20/2013 Document Reviewed: 04/11/2013 [facility] Patient Information 2015 [place], [place]. This information is not intended to replace advice given to you by your health care provider. Make sure you discuss any questions you have with your health care provider.   Trichomonas Test The trichomonas test is done to diagnose trichomoniasis, an infection caused by an organism called Trichomonas. Trichomoniasis is a sexually transmitted infection (STI). In women, it causes vaginal infections. In men, it can cause the tube that carries urine (urethra) to become inflamed (urethritis). You may have this test as a part of a routine screening for STIs or if you have symptoms of trichomoniasis. To perform the test, your health care provider will take a sample of discharge. The sample  is taken from the vagina or cervix in women and from the urethra in men. A urine sample can also be used for testing. RESULTS It is your responsibility to obtain your test results.  Ask the lab or department performing the test when and how you will get your results. Contact your health care provider to discuss any questions you have about your results.  Meaning of Negative Test Results A negative test means you do not have trichomoniasis. Follow your health care provider's directions about any follow-up testing.  Meaning of Positive Test Results A positive test result means you have an active infection that needs to be treated with antibiotic medicine. All your current sexual partners must also be treated or it is likely you will get reinfected.  If your test is positive, your health care provider will start you on medicine and may advise you to:  Not have sexual intercourse until your infection has cleared up.  Use a latex condom properly every time you have sexual intercourse.  Limit the number of sexual partners you have. The more partners you have, the greater your risk of contracting trichomoniasis or another STI.  Tell all sexual partners about your infection so they can also be treated and to prevent reinfection. Document Released: 10/02/2004 Document Revised: 01/14/2014 Document Reviewed: 09/11/2013 [facility] Patient Information 2015 [place], [place]. This information is not intended to replace advice given to you by your health care provider. Make sure you discuss any questions you have with your health care provider.   Trichomoniasis Trichomoniasis is an infection caused by an organism called Trichomonas. The infection can affect both women and men. In women, the outer female genitalia and the vagina are affected. In men, the penis is mainly affected, but the prostate and other reproductive organs can also be involved. Trichomoniasis is a sexually transmitted infection (STI) and is most often passed to another person through sexual contact.  RISK FACTORS  Having unprotected sexual intercourse.  Having sexual intercourse with an infected partner. SIGNS AND  SYMPTOMS  Symptoms of trichomoniasis in women include:  Abnormal gray-green frothy vaginal discharge.  Itching and irritation of the vagina.  Itching and irritation of the area outside the vagina. Symptoms of trichomoniasis in men include:   Penile discharge with or without pain.  Pain during urination. This results from inflammation of the urethra. DIAGNOSIS  Trichomoniasis may be found during a Pap test or physical exam. Your health care provider may use one of the following methods to help diagnose this infection:  Examining vaginal discharge under a microscope. For men, urethral discharge would be examined.  Testing the pH of the vagina with a test tape.  Using a vaginal swab test that checks for the Trichomonas organism. A test is available that provides results within a few minutes.  Doing a culture test for the organism. This is not usually needed. TREATMENT   You may be given medicine to fight the infection. Women should inform their health care provider if they could be or are pregnant. Some medicines used to treat the infection should not be taken during pregnancy.  Your health care provider may recommend over-the-counter medicines or creams to decrease itching or irritation.  Your sexual partner will need to be treated if infected. HOME CARE INSTRUCTIONS   Take medicines only as directed by your health care provider.  Take over-the-counter medicine for itching or irritation as directed by your health care provider.  Do not have sexual intercourse while you have the infection.  Women should not douche or wear tampons while they have the infection.  Discuss your infection with your partner. Your partner may have gotten the infection from you, or you may have gotten it from your partner.  Have your sex partner get examined and treated if necessary.  Practice safe, informed, and protected sex.  See your health care provider for other STI testing. SEEK MEDICAL  CARE IF:   You still have symptoms after you finish your medicine.  You develop abdominal pain.  You have pain when you urinate.  You have bleeding after sexual intercourse.  You develop a rash.  Your medicine makes you sick or makes you throw up (vomit). MAKE SURE YOU:  Understand these instructions.  Will watch your condition.  Will get help right away if you are not doing well or get worse. Document Released: 02/23/2001 Document Revised: 01/14/2014 Document Reviewed: 06/11/2013 [facility] Patient Information 2015 [place], [place]. This information is not intended to replace advice given to you by your health care provider. Make sure you discuss any questions you have with your health care provider.

## 2014-11-20 ENCOUNTER — Other Ambulatory Visit: Payer: BLUE CROSS/BLUE SHIELD

## 2014-11-20 LAB — GC/CHLAMYDIA PROBE AMP
CT Probe RNA: NEGATIVE
GC PROBE AMP APTIMA: NEGATIVE

## 2014-11-20 LAB — TRICHOMONAS VAGINALIS, PROBE AMP: T vaginalis RNA: NEGATIVE

## 2014-11-21 ENCOUNTER — Other Ambulatory Visit: Payer: BLUE CROSS/BLUE SHIELD

## 2014-11-22 NOTE — Progress Notes (Signed)
ED Antimicrobial Stewardship Positive Culture Follow Up   Melinda Lane is an 63 y.o. female who presented to  on 11/12/2014 with a chief complaint of  Chief Complaint  Patient presents with  . Headache    Recent Results (from the past 720 hour(s))  Rapid strep screen     Status: None   Collection Time: 11/12/14  5:43 PM  Result Value Ref Range Status   Streptococcus, Group A Screen (Direct) NEGATIVE NEGATIVE Final    Comment: (NOTE) A Rapid Antigen test may result negative if the antigen level in the sample is below the detection level of this test. The FDA has not cleared this test as a stand-alone test therefore the rapid antigen negative result has reflexed to a Group A Strep culture.   Culture, Group A Strep     Status: Abnormal   Collection Time: 11/12/14  5:43 PM  Result Value Ref Range Status   Strep A Culture Positive (A)  Final    Comment: (NOTE) Penicillin and ampicillin are drugs of choice for treatment of beta-hemolytic streptococcal infections. Susceptibility testing of penicillins and other beta-lactam agents approved by the FDA for treatment of beta-hemolytic streptococcal infections need not be performed routinely because nonsusceptible isolates are extremely rare in any beta-hemolytic streptococcus and have not been reported for Streptococcus pyogenes (group A). (CLSI 2011) Performed At: [facility] [place] [address] [place], [place] [id number] [place] William F MD Ph:[phone]   GC/Chlamydia Probe Amp     Status: None   Collection Time: 11/18/14  8:27 PM  Result Value Ref Range Status   CT Probe RNA NEGATIVE  Final   GC Probe RNA NEGATIVE  Final    Comment:                                                                                         **Normal Reference Range: Negative**         Assay performed using the Gen-Probe APTIMA COMBO2 (R) Assay.   Acceptable specimen types for this assay include APTIMA Swabs (Unisex, endocervical,  urethral, or vaginal), first void urine, and ThinPrep liquid based cytology samples.   Trichomonas vaginalis, RNA     Status: None   Collection Time: 11/18/14  8:30 PM  Result Value Ref Range Status   T vaginalis RNA Negative  Final    Comment:                   ** Normal Reference Range: Negative **   Assay performed by the FDA Approved GenProbe APTIMA Trichomonas vaginalis kit.      [x] Patient discharged originally without antimicrobial agent and treatment is now indicated  New antibiotic prescription: amoxicillin 500mg po BID x 10 days  MD - Dr. Bruce Swords   ,  John 11/22/2014, 3:08 PM Infectious Diseases Pharmacist Phone# [phone]   

## 2014-11-24 ENCOUNTER — Telehealth (HOSPITAL_BASED_OUTPATIENT_CLINIC_OR_DEPARTMENT_OTHER): Payer: Self-pay | Admitting: Emergency Medicine

## 2014-11-24 NOTE — Telephone Encounter (Signed)
pt returned call. ID verified. Patient notified of positive group A strep culture and need for treament. Patient declines prescription for Amoxicillin.

## 2014-11-24 NOTE — Telephone Encounter (Signed)
Attempt to contact regarding positive group A strep Left voicemail to return call to office number Letter sent.

## 2014-11-27 ENCOUNTER — Other Ambulatory Visit: Payer: BLUE CROSS/BLUE SHIELD

## 2014-12-02 ENCOUNTER — Encounter: Payer: Self-pay | Admitting: Family Medicine

## 2014-12-02 ENCOUNTER — Ambulatory Visit: Payer: BLUE CROSS/BLUE SHIELD | Admitting: Neurology

## 2014-12-12 ENCOUNTER — Encounter: Payer: Self-pay | Admitting: Gynecology

## 2014-12-12 ENCOUNTER — Ambulatory Visit (INDEPENDENT_AMBULATORY_CARE_PROVIDER_SITE_OTHER): Payer: BLUE CROSS/BLUE SHIELD | Admitting: Gynecology

## 2014-12-12 VITALS — BP 140/84

## 2014-12-12 DIAGNOSIS — N898 Other specified noninflammatory disorders of vagina: Secondary | ICD-10-CM | POA: Diagnosis not present

## 2014-12-12 DIAGNOSIS — Z780 Asymptomatic menopausal state: Secondary | ICD-10-CM

## 2014-12-12 DIAGNOSIS — Z113 Encounter for screening for infections with a predominantly sexual mode of transmission: Secondary | ICD-10-CM | POA: Diagnosis not present

## 2014-12-12 LAB — WET PREP FOR TRICH, YEAST, CLUE
Clue Cells Wet Prep HPF POC: NONE SEEN
TRICH WET PREP: NONE SEEN
WBC, Wet Prep HPF POC: NONE SEEN
Yeast Wet Prep HPF POC: NONE SEEN

## 2014-12-12 MED ORDER — CLINDAMYCIN PHOSPHATE 2 % VA CREA: 1.0000 | TOPICAL_CREAM | Freq: Every day | VAGINAL | Status: DC

## 2014-12-12 NOTE — Progress Notes (Signed)
   Patient is a 64 -year-old . She is here today because of a chronic yellow vaginal discharge. She is currently seeing some holistic health care provider where she has been receiving these "subdermal implant" consisting of testosterone, progesterone and estrogen every 3 months for her menopausal symptoms. She stated that a few weeks ago she had blood work drawn at her labs were normal. Patient is sexually active and has a monogamous relationship with her partner 40+ years. She thought that her symptoms were attributed to urinary leakage and she stated she visited her urologist recently and was found to have no evidence of urinary incontinence. She has no dysuria, fever, chills, nausea, or vomiting. She had gone to urgent care recently and they do not put her at any vaginal treatment they did not find any etiology and this is what she is here today.   Exam: Blood pressure 140/84 Gen. appearance well-developed well-nourished female in no acute distress Pelvic: Bartholin urethra Skene was within normal limits Vagina: Erythematous slightly tender but no discharge Cervix: No lesions or discharge Bimanual exam: Uterus anteverted normal size shape and consistency no palpable adnexal masses Rectal exam not done  Wet prep was had negative  Patient was counseled for endometrial biopsy. The cervix was cleansed with Betadine solution. A single-tooth tenaculum was placed on the anterior cervical lip and a sterile Pipelle was introduced. Minimal tissue was obtained and was minute for histological evaluation.  Assessment/plan: Postmenopausal patient on continuous subdermal implant hormone replacement therapy consisting of estrogen, progesterone and testosterone placed by her holistic primary care provider. Endometrial biopsy done to rule out any possibility endometrial hyperplasia. She will return back to the office next week for sonohysterogram for better assessment the uterine cavity and as well as her look at  her adnexa as well. A Pap smear was done today as well as a GC and Chlamydia culture. In the interim she'll be prescribed Cleocin vaginal cream to apply daily at bedtime for 7 days.

## 2014-12-12 NOTE — Addendum Note (Signed)
Addended by: Thurnell Garbe A on: 12/12/2014 12:30 PM   Modules accepted: Orders

## 2014-12-12 NOTE — Addendum Note (Signed)
Addended by: Thurnell Garbe A on: 12/12/2014 03:38 PM   Modules accepted: Orders

## 2014-12-13 LAB — GC/CHLAMYDIA PROBE AMP
CT PROBE, AMP APTIMA: NEGATIVE
GC Probe RNA: NEGATIVE

## 2014-12-16 ENCOUNTER — Other Ambulatory Visit: Payer: Self-pay | Admitting: Gynecology

## 2014-12-16 DIAGNOSIS — N959 Unspecified menopausal and perimenopausal disorder: Secondary | ICD-10-CM

## 2014-12-16 DIAGNOSIS — N898 Other specified noninflammatory disorders of vagina: Secondary | ICD-10-CM

## 2014-12-26 ENCOUNTER — Other Ambulatory Visit: Payer: BLUE CROSS/BLUE SHIELD

## 2014-12-26 ENCOUNTER — Ambulatory Visit: Payer: BLUE CROSS/BLUE SHIELD | Admitting: Gynecology

## 2016-03-30 DIAGNOSIS — K5000 Crohn's disease of small intestine without complications: Secondary | ICD-10-CM | POA: Insufficient documentation

## 2016-03-30 HISTORY — DX: Crohn's disease of small intestine without complications: K50.00

## 2017-01-13 DIAGNOSIS — R1319 Other dysphagia: Secondary | ICD-10-CM | POA: Insufficient documentation

## 2017-01-13 HISTORY — DX: Other dysphagia: R13.19

## 2017-01-26 ENCOUNTER — Encounter: Payer: Self-pay | Admitting: Gynecology

## 2017-02-01 ENCOUNTER — Ambulatory Visit: Payer: BLUE CROSS/BLUE SHIELD | Admitting: Podiatry

## 2017-11-22 DIAGNOSIS — M25542 Pain in joints of left hand: Secondary | ICD-10-CM | POA: Insufficient documentation

## 2017-11-22 DIAGNOSIS — M25541 Pain in joints of right hand: Secondary | ICD-10-CM

## 2017-11-22 HISTORY — DX: Pain in joints of right hand: M25.541

## 2017-11-22 HISTORY — DX: Pain in joints of left hand: M25.542

## 2017-11-25 DIAGNOSIS — M159 Polyosteoarthritis, unspecified: Secondary | ICD-10-CM

## 2017-11-25 DIAGNOSIS — M8949 Other hypertrophic osteoarthropathy, multiple sites: Secondary | ICD-10-CM | POA: Insufficient documentation

## 2017-11-25 DIAGNOSIS — M150 Primary generalized (osteo)arthritis: Secondary | ICD-10-CM

## 2017-11-25 HISTORY — DX: Polyosteoarthritis, unspecified: M15.9

## 2017-11-25 HISTORY — DX: Other hypertrophic osteoarthropathy, multiple sites: M89.49

## 2017-11-25 HISTORY — DX: Primary generalized (osteo)arthritis: M15.0

## 2018-03-28 ENCOUNTER — Encounter: Payer: Self-pay | Admitting: Gynecology

## 2018-03-28 ENCOUNTER — Ambulatory Visit: Payer: BLUE CROSS/BLUE SHIELD | Admitting: Gynecology

## 2018-03-28 VITALS — BP 124/82 | Ht 65.0 in | Wt 136.0 lb

## 2018-03-28 DIAGNOSIS — N952 Postmenopausal atrophic vaginitis: Secondary | ICD-10-CM | POA: Diagnosis not present

## 2018-03-28 DIAGNOSIS — Z79890 Hormone replacement therapy (postmenopausal): Secondary | ICD-10-CM

## 2018-03-28 DIAGNOSIS — M8580 Other specified disorders of bone density and structure, unspecified site: Secondary | ICD-10-CM | POA: Diagnosis not present

## 2018-03-28 DIAGNOSIS — Z01419 Encounter for gynecological examination (general) (routine) without abnormal findings: Secondary | ICD-10-CM | POA: Diagnosis not present

## 2018-03-28 DIAGNOSIS — Z1151 Encounter for screening for human papillomavirus (HPV): Secondary | ICD-10-CM

## 2018-03-28 NOTE — Addendum Note (Signed)
Addended by: Nelva Nay on: 03/28/2018 04:23 PM   Modules accepted: Orders

## 2018-03-28 NOTE — Patient Instructions (Signed)
Schedule your mammogram  Follow-up for bone density as scheduled

## 2018-03-28 NOTE — Progress Notes (Signed)
    Melinda Lane 20-Feb-1951 [id number]        66 y.o.  [id number] for annual gynecologic exam.  Former patient of Dr. Toney Rakes.  Has not been in the office for over 3 years.  Without gynecologic complaints.  Several issues noted below.  Past medical history,surgical history, problem list, medications, allergies, family history and social history were all reviewed and documented as reviewed in the EPIC chart.  ROS:  Performed with pertinent positives and negatives included in the history, assessment and plan.   Additional significant findings : None   Exam: Melinda Lane assistant Vitals:   03/28/18 1523  BP: 124/82  Weight: 136 lb (61.7 kg)  Height: 5\' 5"  (1.651 m)   Body mass index is 22.63 kg/m.  General appearance:  Normal affect, orientation and appearance. Skin: Grossly normal HEENT: Without gross lesions.  No cervical or supraclavicular adenopathy. Thyroid normal.  Lungs:  Clear without wheezing, rales or rhonchi Cardiac: RR, with 3/6 systolic ejection murmur Abdominal:  Soft, nontender, without masses, guarding, rebound, organomegaly or hernia Breasts:  Examined lying and sitting without masses, retractions, discharge or axillary adenopathy.  Bilateral implants noted Pelvic:  Ext, BUS, Vagina: With atrophic changes  Cervix: With atrophic changes.  Pap smear done  Uterus: Anteverted, normal size, shape and contour, midline and mobile nontender   Adnexa: Without masses or tenderness    Anus and perineum: Normal   Rectovaginal: Normal sphincter tone without palpated masses or tenderness.    Assessment/Plan:  67 y.o. [id number] female for annual gynecologic exam.   1. Postmenopausal/HRT.  Is on a bioidentical hormone replacement regiment through a clinic in [place].  Includes estrogen, testosterone and progesterone.  I reviewed the whole issue of HRT with patient to include various studies and the risks versus benefits to include thrombosis such as stroke heart attack DVT in  the breast cancer issue.  Testosterone possibly increasing the risks of cardiovascular disease liver as well as weight hair growth and acne.  Benefits to include symptom relief and possible cardiovascular as well as bone health discussed.  Bioequivalent versus pharmaceutical brand with the issues of less well studied and potency issues discussed.  Patient understands that I am not involved in her HRT regiment and that her [facility] clinic will oversee this for her.  She has done no bleeding and knows the importance of reporting any bleeding. 2. Mammography 2012.  I reminded the patient she is way overdue.  The most common cancer in women stressed.  The need to schedule baseline mammogram now particularly with implants and HRT.  Breast exam is normal now.  The patient agrees to schedule. 3. Osteopenia.  DEXA 2015 T score -1.9.  Recommend follow-up DEXA now at 4-year interval.  Patient will schedule in follow-up for this. 4. Pap smear/HPV 2015.  Pap smear/HPV done today given her lack of annual follow-up. 5. Colonoscopy 2017.  Repeat at their recommended interval. 6. Health maintenance.  No routine lab work done as patient reports this done elsewhere.  Follow-up for bone density.  Schedule her mammogram.  Follow-up for annual exam in 1 year.   Melinda Auerbach MD, 3:53 PM 03/28/2018

## 2018-03-29 LAB — PAP IG AND HPV HIGH-RISK: HPV DNA High Risk: NOT DETECTED

## 2018-05-10 ENCOUNTER — Encounter: Payer: Self-pay | Admitting: Gynecology

## 2019-02-19 ENCOUNTER — Other Ambulatory Visit: Payer: Self-pay

## 2019-02-20 ENCOUNTER — Encounter: Payer: Self-pay | Admitting: Gynecology

## 2019-02-20 ENCOUNTER — Ambulatory Visit: Payer: Medicare Other | Admitting: Gynecology

## 2019-02-20 VITALS — BP 120/70

## 2019-02-20 DIAGNOSIS — N907 Vulvar cyst: Secondary | ICD-10-CM | POA: Diagnosis not present

## 2019-02-20 NOTE — Patient Instructions (Signed)
Clean over the area that was drained today.  Follow-up if this area persists to be an issue.

## 2019-02-20 NOTE — Progress Notes (Signed)
    Wrenley Sayed 11/11/1950 [id number]        68 y.o.  [id number] presents with a 3 to 4-week history of a sore right vulvar area.  Also notes most recently developing a second bump on the left labia.  Initially the right area presented as a lump and then had some drainage and now has a white coating to it.  It is tender to manipulation.  No fever or chills.  No vaginal discharge or odor.  No UTI symptoms.  Past medical history,surgical history, problem list, medications, allergies, family history and social history were all reviewed and documented in the EPIC chart.  Directed ROS with pertinent positives and negatives documented in the history of present illness/assessment and plan.  Exam: [name] assistant Vitals:   02/20/19 1045  BP: 120/70   General appearance:  Normal External BUS vagina with atrophic changes.  Small round area with white coating upper inner right labia majora in the periclitoral region.  1 cm classic sebaceous cyst upper inner left labia majora.  Mobile with no overlying skin changes.  Cervix with atrophic changes.  Uterus grossly normal midline mobile nontender.  Adnexa without masses or tenderness.  Physical Exam  Genitourinary:      Under colposcopic guidance the right area was probed with a swab and subsequently classic sebaceous material was extruded.  A small divot remained consistent with the posterior aspect of a sebaceous cyst wall.  Assessment/Plan:  68 y.o. [id number] with probable infection/drainage of a sebaceous cyst on the right.  The remaining amount of sebaceous material was removed and that appears that there is a small divot consistent with the posterior aspect of the cyst wall.  She is going to cleanse this with a washcloth in the shower to remove any remaining sebaceous material and then hopefully this area will heal.  She will read present if there is any residual abnormality in this area.  We discussed the left classic appearing sebaceous cyst.  She  is not symptomatic from this.  Options to I&D this area versus observe discussed and she is comfortable with watching and as long as it remains stable or resolves on its own she will follow.  If it changes or becomes symptomatic she will be present.    Anastasio Auerbach MD, 11:00 AM 02/20/2019

## 2019-05-22 ENCOUNTER — Telehealth: Payer: Self-pay | Admitting: Neurology

## 2019-05-22 NOTE — Telephone Encounter (Signed)
Ok to switch 

## 2019-05-22 NOTE — Telephone Encounter (Signed)
Pt has been seen in the past by Dr. Krista Blue but is wanting to switch to Dr. Jannifer Franklin new referral  frm Dr. Mali Mcintyre for pain in her hand/is it ok for pt to switch?

## 2019-06-20 ENCOUNTER — Encounter: Payer: Self-pay | Admitting: Gynecology

## 2019-07-04 ENCOUNTER — Ambulatory Visit: Payer: Medicare Other | Admitting: Neurology

## 2019-07-24 ENCOUNTER — Ambulatory Visit: Payer: Medicare Other | Admitting: Neurology

## 2019-07-24 ENCOUNTER — Encounter: Payer: Self-pay | Admitting: Neurology

## 2019-07-24 ENCOUNTER — Other Ambulatory Visit: Payer: Self-pay

## 2019-07-24 DIAGNOSIS — G20 Parkinson's disease: Secondary | ICD-10-CM | POA: Diagnosis not present

## 2019-07-24 DIAGNOSIS — G20A1 Parkinson's disease without dyskinesia, without mention of fluctuations: Secondary | ICD-10-CM

## 2019-07-24 HISTORY — DX: Parkinson's disease: G20

## 2019-07-24 HISTORY — DX: Parkinson's disease without dyskinesia, without mention of fluctuations: G20.A1

## 2019-07-24 MED ORDER — CARBIDOPA-LEVODOPA 25-100 MG PO TABS: ORAL_TABLET | ORAL | 3 refills | Status: DC

## 2019-07-24 NOTE — Progress Notes (Signed)
Reason for visit: Tremor  Melinda Lane is a 68 y.o. female  History of present illness:  Melinda Lane is a 68 year old right-handed white female who was seen through this office in 2016 with headaches.  The patient returns at this point for problems with a tremor that developed in July 2020.  The tremor affects the right arm and is a true resting tremor.  The patient has noted that she is having some difficulty managing a computer mouse, she has noted micrographia with her handwriting.  She denies any changes in her walking, she has not had any falls.  She at times feels as if there is a slight tremor in her voice.  She denies any swallowing or choking problems or any drooling.  She has not noted any memory change or difficulty with concentration.  She claims that her mother had a slight tremor as she got older but was never treated for it.  The patient denies any head and neck tremor.  She is sent to this office for an evaluation.  There have been no problems with numbness or weakness of the extremities.  Past Medical History:  Diagnosis Date  . Hypertension   . Hypothyroidism   . 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[facility])     Past Surgical History:  Procedure Laterality Date  . APPENDECTOMY    . AUGMENTATION MAMMAPLASTY     saline - 2015  . CHOLECYSTECTOMY    . ECTOPIC PREGNANCY SURGERY    . KNEE SURGERY    . TONSILLECTOMY      Family History  Problem Relation Age of Onset  . Hypertension Father   . Heart disease Father   . Hypertension Mother   . Heart disease Mother     Social history:  reports that she has never smoked. She has never used smokeless tobacco. She reports current alcohol use. She reports that she does not use drugs.  Medications:  Prior to Admission medications   Medication Sig Start Date End Date Taking? Authorizing Provider  amLODipine (NORVASC) 2.5 MG tablet Take 2.5 mg by mouth daily.   Yes [provider]  aspirin 81 MG tablet Take 81 mg by mouth daily.    Yes [provider]  calcium carbonate 1250 MG capsule Take 1,250 mg by mouth 2 (two) times daily with a meal.   Yes [provider]  cholecalciferol (VITAMIN D) 1000 UNITS tablet Take 1,000 Units by mouth daily.   Yes [provider]  ferrous sulfate 325 (65 FE) MG tablet Take 325 mg by mouth daily with breakfast.   Yes [provider]  ibuprofen (ADVIL,MOTRIN) 600 MG tablet Take 1 tablet (600 mg total) by mouth every 6 (six) hours as needed. 11/12/14  Yes Kirichenko, Tatyana, PA-C  meloxicam (MOBIC) 15 MG tablet Take 15 mg by mouth daily.   Yes [provider]  mesalamine (PENTASA) 500 MG CR capsule Take 1,000 mg by mouth 2 (two) times daily.    Yes [provider]  Multiple Vitamin (MULTIVITAMIN) tablet Take 1 tablet by mouth daily.   Yes [provider]  NONFORMULARY OR COMPOUNDED ITEM Estrogen and Testosterone pellets   Yes [provider]  Omega-3 Fatty Acids (FISH OIL PO) Take 1 capsule by mouth daily.   Yes [provider]  progesterone (PROMETRIUM) 200 MG capsule Take 200 mg by mouth daily.   Yes [provider]  telmisartan (MICARDIS) 40 MG tablet Take 40 mg by mouth daily.   Yes [provider]  vitamin E 100 UNIT capsule Take 100 Units by mouth daily.   Yes [provider]      Allergies  Allergen Reactions  . Zithromax [Azithromycin] Rash    ROS:  Out of a complete 14 system review of symptoms, the patient complains only of the following symptoms, and all other reviewed systems are negative.  Tremor Insomnia  Blood pressure (!) 147/88, pulse 74, temperature 98.3 F (36.8 C), temperature source Temporal, height 5' 5.5" (1.664 m), weight 139 lb (63 kg).  Physical Exam  General: The patient is alert and cooperative at the time of the examination.  Eyes: Pupils are equal, round, and reactive to light. Discs are flat bilaterally.  Neck: The neck is supple, no carotid  bruits are noted.  Respiratory: The respiratory examination is clear.  Cardiovascular: The cardiovascular examination reveals a regular rate and rhythm, no obvious murmurs or rubs are noted.  Skin: Extremities are without significant edema.  Neurologic Exam  Mental status: The patient is alert and oriented x 3 at the time of the examination. The patient has apparent normal recent and remote memory, with an apparently normal attention span and concentration ability.  Cranial nerves: Facial symmetry is present. There is good sensation of the face to pinprick and soft touch bilaterally. The strength of the facial muscles and the muscles to head turning and shoulder shrug are normal bilaterally. Speech is well enunciated, no aphasia or dysarthria is noted. Extraocular movements are full. Visual fields are full. The tongue is midline, and the patient has symmetric elevation of the soft palate. No obvious hearing deficits are noted.  Masking of the face is seen.  Decreased eye blink is noted.  Motor: The motor testing reveals 5 over 5 strength of all 4 extremities. Good symmetric motor tone is noted throughout.  Sensory: Sensory testing is intact to pinprick, soft touch, vibration sensation, and position sense on all 4 extremities. No evidence of extinction is noted.  Coordination: Cerebellar testing reveals good finger-nose-finger and heel-to-shin bilaterally.  A prominent resting tremor involving the right upper extremity was noted.  Gait and station: The patient is able to rise from a seated position with the arms crossed.  Once up, she is able to walk independently, there is decreased arm swing bilaterally, right greater than left, resting tremor seen while walking.  The head appears to be slightly forward, stooped.  Tandem gait is normal. Romberg is negative. No drift is seen.  Reflexes: Deep tendon reflexes are symmetric and normal bilaterally. Toes are downgoing bilaterally.    Assessment/Plan:  1.  Parkinson's disease  The patient appears to have features of Parkinson's disease with right-sided features.  She claims that the tremor is not allowing her to rest well at night, she will take Benadryl in the evening for sleep and to suppress the tremor.  She will be started on Sinemet starting on the 25/100 mg tablets, 1/2 tablet 3 times daily for 4 weeks and then go to 1 full tablet 3 times daily.  If the tremor is persistent and significant, the patient will call at that point and we may consider the use of Cogentin.  In the future, will add Mirapex at some point.  She will follow-up here in 5 months.  Jill Alexanders MD 07/24/2019 11:01 AM  [facility] [address] [place], [place] [address]  Phone [phone] Fax [phone]

## 2019-07-24 NOTE — Patient Instructions (Signed)
We will start Sinemet 25/100 mg tablets for the parkinson's disease.  Sinemet (carbidopa) may result in confusion or hallucinations, drowsiness, nausea, or dizziness. If any significant side effects are noted, please contact our office. Sinemet may not be well absorbed when taken with high protein meals, if tolerated it is best to take 30-45 minutes before you eat.

## 2019-09-17 ENCOUNTER — Emergency Department (HOSPITAL_BASED_OUTPATIENT_CLINIC_OR_DEPARTMENT_OTHER)
Admission: EM | Admit: 2019-09-17 | Discharge: 2019-09-17 | Disposition: A | Discharge status: Home / Self Care | Payer: Medicare Other | Attending: Emergency Medicine | Admitting: Emergency Medicine

## 2019-09-17 ENCOUNTER — Emergency Department (HOSPITAL_BASED_OUTPATIENT_CLINIC_OR_DEPARTMENT_OTHER): Payer: Medicare Other

## 2019-09-17 ENCOUNTER — Other Ambulatory Visit: Payer: Self-pay

## 2019-09-17 ENCOUNTER — Encounter (HOSPITAL_BASED_OUTPATIENT_CLINIC_OR_DEPARTMENT_OTHER): Payer: Self-pay | Admitting: *Deleted

## 2019-09-17 DIAGNOSIS — R0789 Other chest pain: Secondary | ICD-10-CM | POA: Diagnosis not present

## 2019-09-17 DIAGNOSIS — Z5321 Procedure and treatment not carried out due to patient leaving prior to being seen by health care provider: Secondary | ICD-10-CM | POA: Insufficient documentation

## 2019-09-17 DIAGNOSIS — R519 Headache, unspecified: Secondary | ICD-10-CM | POA: Insufficient documentation

## 2019-09-17 NOTE — ED Triage Notes (Signed)
C/o fall on pavement, c/o head injury , right rib injury x 3 hrs ago

## 2019-10-10 DIAGNOSIS — G2581 Restless legs syndrome: Secondary | ICD-10-CM

## 2019-10-10 DIAGNOSIS — R251 Tremor, unspecified: Secondary | ICD-10-CM

## 2019-10-10 HISTORY — DX: Restless legs syndrome: G25.81

## 2019-10-10 HISTORY — DX: Tremor, unspecified: R25.1

## 2019-11-05 ENCOUNTER — Other Ambulatory Visit: Payer: Self-pay | Admitting: Neurology

## 2019-12-26 ENCOUNTER — Ambulatory Visit: Payer: Medicare Other | Admitting: Neurology

## 2020-02-12 ENCOUNTER — Telehealth: Payer: Self-pay | Admitting: Adult Health

## 2020-02-12 ENCOUNTER — Telehealth: Payer: Self-pay | Admitting: Unknown Physician Specialty

## 2020-02-12 ENCOUNTER — Other Ambulatory Visit: Payer: Self-pay | Admitting: Unknown Physician Specialty

## 2020-02-12 DIAGNOSIS — I10 Essential (primary) hypertension: Secondary | ICD-10-CM

## 2020-02-12 DIAGNOSIS — U071 COVID-19: Secondary | ICD-10-CM

## 2020-02-12 NOTE — Telephone Encounter (Signed)
  This is a copy of patient and her Spouse David's positive COVID19 home test results from 02/10/2020.

## 2020-02-12 NOTE — Telephone Encounter (Signed)
  I connected by phone with Melinda Lane on 02/12/2020 at 4:05 PM to discuss the potential use of an new treatment for mild to moderate COVID-19 viral infection in non-hospitalized patients.  This patient is a 69 y.o. female that meets the FDA criteria for Emergency Use Authorization of bamlanivimab/etesevimab or casirivimab/imdevimab.  Has a (+) direct SARS-CoV-2 viral test result  Has mild or moderate COVID-19   Is NOT hospitalized due to COVID-19  Is within 10 days of symptom onset  Has at least one of the high risk factor(s) for progression to severe COVID-19 and/or hospitalization as defined in EUA.  Specific high risk criteria : Older age (>/= 68 yo)   I have spoken and communicated the following to the patient or parent/caregiver:  1. FDA has authorized the emergency use of bamlanivimab/etesevimab and casirivimab\imdevimab for the treatment of mild to moderate COVID-19 in adults and pediatric patients with positive results of direct SARS-CoV-2 viral testing who are 23 years of age and older weighing at least 40 kg, and who are at high risk for progressing to severe COVID-19 and/or hospitalization.  2. The significant known and potential risks and benefits of bamlanivimab/etesevimab and casirivimab\imdevimab, and the extent to which such potential risks and benefits are unknown.  3. Information on available alternative treatments and the risks and benefits of those alternatives, including clinical trials.  4. Patients treated with bamlanivimab/etesevimab and casirivimab\imdevimab should continue to self-isolate and use infection control measures (e.g., wear mask, isolate, social distance, avoid sharing personal items, clean and disinfect "high touch" surfaces, and frequent handwashing) according to CDC guidelines.   5. The patient or parent/caregiver has the option to accept or refuse bamlanivimab/etesevimab or casirivimab\imdevimab .  After reviewing this information with the patient,  The patient agreed to proceed with receiving the bamlanimivab infusion and will be provided a copy of the Fact sheet prior to receiving the infusion.[name] 02/12/2020 4:05 PM  Day 7 of illness

## 2020-02-12 NOTE — Telephone Encounter (Signed)
Melinda Lane called, because she was unable to get the positive covid test results out of the trash before pick up came, and she and her husband are due to come in for treatment tomorrow.  After discussion, she realized that she had taken a picture of them.  She is not on my chart, so she will email me her and her husband's results.    Melinda Bihari, NP

## 2020-02-13 ENCOUNTER — Ambulatory Visit (HOSPITAL_COMMUNITY)
Admission: RE | Admit: 2020-02-13 | Discharge: 2020-02-13 | Disposition: A | Discharge status: Home / Self Care | Payer: Medicare Other | Source: Ambulatory Visit | Attending: Pulmonary Disease | Admitting: Pulmonary Disease

## 2020-02-13 DIAGNOSIS — U071 COVID-19: Secondary | ICD-10-CM | POA: Insufficient documentation

## 2020-02-13 DIAGNOSIS — Z23 Encounter for immunization: Secondary | ICD-10-CM | POA: Insufficient documentation

## 2020-02-13 DIAGNOSIS — I10 Essential (primary) hypertension: Secondary | ICD-10-CM | POA: Insufficient documentation

## 2020-02-13 MED ORDER — EPINEPHRINE 0.3 MG/0.3ML IJ SOAJ: 0.3000 mg | Freq: Once | INTRAMUSCULAR | Status: DC | PRN

## 2020-02-13 MED ORDER — METHYLPREDNISOLONE SODIUM SUCC 125 MG IJ SOLR: 125.0000 mg | Freq: Once | INTRAMUSCULAR | Status: DC | PRN

## 2020-02-13 MED ORDER — ALBUTEROL SULFATE HFA 108 (90 BASE) MCG/ACT IN AERS: 2.0000 | INHALATION_SPRAY | Freq: Once | RESPIRATORY_TRACT | Status: DC | PRN

## 2020-02-13 MED ORDER — SODIUM CHLORIDE 0.9 % IV SOLN: INTRAVENOUS | Status: DC | PRN

## 2020-02-13 MED ORDER — FAMOTIDINE IN NACL 20-0.9 MG/50ML-% IV SOLN: 20.0000 mg | Freq: Once | INTRAVENOUS | Status: DC | PRN

## 2020-02-13 MED ORDER — DIPHENHYDRAMINE HCL 50 MG/ML IJ SOLN: 50.0000 mg | Freq: Once | INTRAMUSCULAR | Status: DC | PRN

## 2020-02-13 MED ORDER — SODIUM CHLORIDE 0.9 % IV SOLN
Freq: Once | INTRAVENOUS | Status: AC
  Filled 2020-02-13: qty 700

## 2020-02-13 NOTE — Discharge Instructions (Signed)
10 Things You Can Do to Manage Your COVID-19 Symptoms at Home If you have possible or confirmed COVID-19: 1. Stay home from work and school. And stay away from other public places. If you must go out, avoid using any kind of public transportation, ridesharing, or taxis. 2. Monitor your symptoms carefully. If your symptoms get worse, call your healthcare provider immediately. 3. Get rest and stay hydrated. 4. If you have a medical appointment, call the healthcare provider ahead of time and tell them that you have or may have COVID-19. 5. For medical emergencies, call 911 and notify the dispatch personnel that you have or may have COVID-19. 6. Cover your cough and sneezes with a tissue or use the inside of your elbow. 7. Wash your hands often with soap and water for at least 20 seconds or clean your hands with an alcohol-based hand sanitizer that contains at least 60% alcohol. 8. As much as possible, stay in a specific room and away from other people in your home. Also, you should use a separate bathroom, if available. If you need to be around other people in or outside of the home, wear a mask. 9. Avoid sharing personal items with other people in your household, like dishes, towels, and bedding. 10. Clean all surfaces that are touched often, like counters, tabletops, and doorknobs. Use household cleaning sprays or wipes according to the label instructions. cdc.gov/coronavirus 03/14/2019 This information is not intended to replace advice given to you by your health care provider. Make sure you discuss any questions you have with your health care provider. Document Revised: 08/16/2019 Document Reviewed: 08/16/2019 Elsevier Patient Education  2020 [employer] Patient is asked to monitor BP at home or work, several times per month and return with written values at next office visit.  

## 2020-02-13 NOTE — Progress Notes (Signed)
  Diagnosis: COVID-19  Physician: Dr. Joya Gaskins   Procedure: [facility] Med: bamlanivimab\etesevimab infusion - Provided patient with bamlanimivab\etesevimab fact sheet for patients, parents and caregivers prior to infusion.  Complications: No immediate complications noted.  Discharge: Discharged home   Claudia Desanctis 02/13/2020

## 2020-02-27 ENCOUNTER — Other Ambulatory Visit: Payer: Self-pay

## 2020-02-27 MED ORDER — CARBIDOPA-LEVODOPA 25-100 MG PO TABS: ORAL_TABLET | ORAL | 3 refills | Status: DC

## 2021-03-10 ENCOUNTER — Emergency Department (HOSPITAL_BASED_OUTPATIENT_CLINIC_OR_DEPARTMENT_OTHER): Payer: Medicare Other

## 2021-03-10 ENCOUNTER — Other Ambulatory Visit: Payer: Self-pay

## 2021-03-10 ENCOUNTER — Observation Stay (HOSPITAL_BASED_OUTPATIENT_CLINIC_OR_DEPARTMENT_OTHER)
Admission: EM | Admit: 2021-03-10 | Discharge: 2021-03-12 | Disposition: A | Discharge status: Home / Self Care | Payer: Medicare Other | Attending: Internal Medicine | Admitting: Internal Medicine

## 2021-03-10 ENCOUNTER — Encounter (HOSPITAL_BASED_OUTPATIENT_CLINIC_OR_DEPARTMENT_OTHER): Payer: Self-pay | Admitting: *Deleted

## 2021-03-10 DIAGNOSIS — Z7982 Long term (current) use of aspirin: Secondary | ICD-10-CM | POA: Insufficient documentation

## 2021-03-10 DIAGNOSIS — E041 Nontoxic single thyroid nodule: Secondary | ICD-10-CM | POA: Diagnosis not present

## 2021-03-10 DIAGNOSIS — Z20822 Contact with and (suspected) exposure to covid-19: Secondary | ICD-10-CM | POA: Diagnosis not present

## 2021-03-10 DIAGNOSIS — I2510 Atherosclerotic heart disease of native coronary artery without angina pectoris: Principal | ICD-10-CM | POA: Insufficient documentation

## 2021-03-10 DIAGNOSIS — R7989 Other specified abnormal findings of blood chemistry: Secondary | ICD-10-CM

## 2021-03-10 DIAGNOSIS — R778 Other specified abnormalities of plasma proteins: Secondary | ICD-10-CM

## 2021-03-10 DIAGNOSIS — R0789 Other chest pain: Secondary | ICD-10-CM | POA: Diagnosis present

## 2021-03-10 DIAGNOSIS — G20 Parkinson's disease: Secondary | ICD-10-CM | POA: Diagnosis not present

## 2021-03-10 DIAGNOSIS — Z79899 Other long term (current) drug therapy: Secondary | ICD-10-CM | POA: Insufficient documentation

## 2021-03-10 DIAGNOSIS — E039 Hypothyroidism, unspecified: Secondary | ICD-10-CM | POA: Insufficient documentation

## 2021-03-10 DIAGNOSIS — R748 Abnormal levels of other serum enzymes: Secondary | ICD-10-CM | POA: Diagnosis not present

## 2021-03-10 DIAGNOSIS — R0602 Shortness of breath: Secondary | ICD-10-CM

## 2021-03-10 DIAGNOSIS — I10 Essential (primary) hypertension: Secondary | ICD-10-CM | POA: Diagnosis not present

## 2021-03-10 DIAGNOSIS — R079 Chest pain, unspecified: Secondary | ICD-10-CM | POA: Diagnosis present

## 2021-03-10 HISTORY — DX: Irritable bowel syndrome, unspecified: K58.9

## 2021-03-10 LAB — BASIC METABOLIC PANEL WITH GFR
Anion gap: 7 (ref 5–15)
BUN: 20 mg/dL (ref 8–23)
CO2: 30 mmol/L (ref 22–32)
Calcium: 8.7 mg/dL — ABNORMAL LOW (ref 8.9–10.3)
Chloride: 100 mmol/L (ref 98–111)
Creatinine, Ser: 0.76 mg/dL (ref 0.44–1.00)
GFR, Estimated: >60 mL/min
Glucose, Bld: 112 mg/dL — ABNORMAL HIGH (ref 70–99)
Potassium: 3.9 mmol/L (ref 3.5–5.1)
Sodium: 137 mmol/L (ref 135–145)

## 2021-03-10 LAB — CBC
HCT: 44 % (ref 36.0–46.0)
Hemoglobin: 15 g/dL (ref 12.0–15.0)
MCH: 32.4 pg (ref 26.0–34.0)
MCHC: 34.1 g/dL (ref 30.0–36.0)
MCV: 95 fL (ref 80.0–100.0)
Platelets: 333 K/uL (ref 150–400)
RBC: 4.63 MIL/uL (ref 3.87–5.11)
RDW: 14.4 % (ref 11.5–15.5)
WBC: 7.6 K/uL (ref 4.0–10.5)
nRBC: 0 % (ref 0.0–0.2)

## 2021-03-10 LAB — LIPASE, BLOOD: Lipase: 31 U/L (ref 11–51)

## 2021-03-10 LAB — HEPATIC FUNCTION PANEL
ALT: 5 U/L (ref 0–44)
AST: 10 U/L — ABNORMAL LOW (ref 15–41)
Albumin: 2.6 g/dL — ABNORMAL LOW (ref 3.5–5.0)
Alkaline Phosphatase: 46 U/L (ref 38–126)
Bilirubin, Direct: <0.1 mg/dL (ref 0.0–0.2)
Total Bilirubin: 0.2 mg/dL — ABNORMAL LOW (ref 0.3–1.2)
Total Protein: 4.3 g/dL — ABNORMAL LOW (ref 6.5–8.1)

## 2021-03-10 LAB — RESP PANEL BY RT-PCR (FLU A&B, COVID) ARPGX2
Influenza A by PCR: NEGATIVE
Influenza B by PCR: NEGATIVE
SARS Coronavirus 2 by RT PCR: NEGATIVE

## 2021-03-10 LAB — TROPONIN I (HIGH SENSITIVITY)
Troponin I (High Sensitivity): 12 ng/L
Troponin I (High Sensitivity): 62 ng/L — ABNORMAL HIGH (ref <18)

## 2021-03-10 MED ORDER — NITROGLYCERIN 0.4 MG SL SUBL
0.4000 mg | SUBLINGUAL_TABLET | SUBLINGUAL | Status: DC | PRN
  Administered 2021-03-10 – 2021-03-11 (×2): 0.4 mg via SUBLINGUAL
  Filled 2021-03-10: qty 1

## 2021-03-10 MED ORDER — HYDRALAZINE HCL 20 MG/ML IJ SOLN
10.0000 mg | Freq: Once | INTRAMUSCULAR | Status: AC
  Administered 2021-03-10: 10 mg via INTRAVENOUS
  Filled 2021-03-10: qty 1

## 2021-03-10 MED ORDER — IOHEXOL 350 MG/ML SOLN
100.0000 mL | Freq: Once | INTRAVENOUS | Status: AC | PRN
  Administered 2021-03-10: 100 mL via INTRAVENOUS

## 2021-03-10 MED ORDER — ASPIRIN 81 MG PO CHEW
162.0000 mg | CHEWABLE_TABLET | Freq: Once | ORAL | Status: AC
  Administered 2021-03-10: 162 mg via ORAL
  Filled 2021-03-10: qty 2

## 2021-03-10 NOTE — ED Provider Notes (Signed)
[facility] Provider Note   CSN: [id number] Arrival date & time: 03/10/21  2011     History Chief Complaint  Patient presents with   Chest Pain    Melinda Lane is a 70 y.o. female.  The history is provided by the patient and medical records. No language interpreter was used.  Chest Pain Pain location:  Substernal area and L chest Pain quality: aching, burning and crushing   Pain radiates to:  L shoulder, R shoulder and neck Pain severity:  Moderate Onset quality:  Sudden Timing:  Constant Progression:  Improving Chronicity:  New Relieved by:  Nothing Worsened by:  Nothing Ineffective treatments:  Aspirin Associated symptoms: diaphoresis, fatigue and shortness of breath   Associated symptoms: no abdominal pain, no altered mental status, no back pain, no cough, no dizziness, no fever, no headache, no lower extremity edema, no nausea, no near-syncope, no numbness, no palpitations, no syncope, no vomiting and no weakness       Past Medical History:  Diagnosis Date   Hypertension    Hypothyroidism    Parkinson's disease ([place]) 07/24/2019   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[facility])     Patient Active Problem List   Diagnosis Date Noted   Parkinson's disease ([place]) 07/24/2019   Absence of bladder continence 11/15/2014   Cephalalgia 11/15/2014   Chest pain 04/12/2012   HTN (hypertension) 04/12/2012   Hypothyroidism 04/12/2012   Hyperlipemia 04/12/2012   Thyroid nodule 04/12/2012    Past Surgical History:  Procedure Laterality Date   APPENDECTOMY     AUGMENTATION MAMMAPLASTY     saline - 2015   CHOLECYSTECTOMY     ECTOPIC PREGNANCY SURGERY     KNEE SURGERY     TONSILLECTOMY       OB History     Gravida  4   Para  2   Term      Preterm      AB  2   Living  2      SAB  1   IAB      Ectopic  1   Multiple      Live Births              Family History  Problem Relation Age of Onset   Hypertension Father    Heart  disease Father    Hypertension Mother    Heart disease Mother     Social History   Tobacco Use   Smoking status: Never   Smokeless tobacco: Never  Vaping Use   Vaping Use: Never used  Substance Use Topics   Alcohol use: Yes    Alcohol/week: 0.0 standard drinks    Comment: One weekly - 2 glasses of wine   Drug use: No    Home Medications Prior to Admission medications   Medication Sig Start Date End Date Taking? Authorizing Provider  amLODipine (NORVASC) 2.5 MG tablet Take 2.5 mg by mouth daily.   Yes [provider]  carbidopa-levodopa (SINEMET IR) 25-100 MG tablet TAKE 1/2 TABLET BY MOUTH THREE TIMES DAILY FOR 4 WEEKS THEN TAKE 1 TABLET BY MOUTH THREE TIMES DAILY 02/27/20  Yes Kathrynn Ducking, MD  ferrous sulfate 325 (65 FE) MG tablet Take 325 mg by mouth daily with breakfast.   Yes [provider]  meloxicam (MOBIC) 15 MG tablet Take 15 mg by mouth daily.   Yes [provider]  mesalamine (PENTASA) 500 MG CR capsule Take 1,000 mg by mouth 2 (two) times  daily.    Yes [provider]  Multiple Vitamin (MULTIVITAMIN) tablet Take 1 tablet by mouth daily.   Yes [provider]  Omega-3 Fatty Acids (FISH OIL PO) Take 1 capsule by mouth daily.   Yes [provider]  progesterone (PROMETRIUM) 200 MG capsule Take 200 mg by mouth daily.   Yes [provider]  telmisartan (MICARDIS) 40 MG tablet Take 40 mg by mouth daily.   Yes [provider]  vitamin E 100 UNIT capsule Take 100 Units by mouth daily.   Yes [provider]  aspirin 81 MG tablet Take 81 mg by mouth daily.    [provider]  calcium carbonate 1250 MG capsule Take 1,250 mg by mouth 2 (two) times daily with a meal.    [provider]  cholecalciferol (VITAMIN D) 1000 UNITS tablet Take 1,000 Units by mouth daily.    [provider]  ibuprofen (ADVIL,MOTRIN) 600 MG tablet Take 1 tablet (600 mg total) by mouth every 6  (six) hours as needed. 11/12/14   Jeannett Senior, PA-C  NONFORMULARY OR COMPOUNDED ITEM Estrogen and Testosterone pellets    [provider]    Allergies    Zithromax [azithromycin]  Review of Systems   Review of Systems  Constitutional:  Positive for diaphoresis and fatigue. Negative for chills and fever.  HENT:  Negative for congestion.   Eyes:  Negative for visual disturbance.  Respiratory:  Positive for shortness of breath. Negative for cough, chest tightness and wheezing.   Cardiovascular:  Positive for chest pain. Negative for palpitations, leg swelling, syncope and near-syncope.  Gastrointestinal:  Negative for abdominal pain, constipation, diarrhea, nausea and vomiting.  Genitourinary:  Negative for dysuria, flank pain and frequency.  Musculoskeletal:  Negative for back pain, neck pain and neck stiffness.  Skin:  Negative for rash and wound.  Neurological:  Positive for light-headedness. Negative for dizziness, weakness, numbness and headaches.  Psychiatric/Behavioral:  Negative for agitation and confusion.   All other systems reviewed and are negative.  Physical Exam Updated Vital Signs BP (!) 163/84   Pulse 64   Temp 98.5 F (36.9 C) (Oral)   Resp 16   Ht 5' 4.5" (1.638 m)   Wt 61 kg   SpO2 97%   BMI 22.71 kg/m   Physical Exam Vitals and nursing note reviewed.  Constitutional:      General: She is not in acute distress.    Appearance: She is well-developed. She is not ill-appearing, toxic-appearing or diaphoretic.  HENT:     Head: Normocephalic and atraumatic.  Eyes:     Extraocular Movements: Extraocular movements intact.     Conjunctiva/sclera: Conjunctivae normal.     Pupils: Pupils are equal, round, and reactive to light.  Cardiovascular:     Rate and Rhythm: Normal rate and regular rhythm.     Heart sounds: Normal heart sounds. No murmur heard. Pulmonary:     Effort: Pulmonary effort is normal. No respiratory distress.     Breath sounds:  Normal breath sounds. No decreased breath sounds, wheezing, rhonchi or rales.  Chest:     Chest wall: No tenderness.  Abdominal:     Palpations: Abdomen is soft.     Tenderness: There is no abdominal tenderness.  Musculoskeletal:     Cervical back: Neck supple.     Right lower leg: No tenderness. No edema.     Left lower leg: No tenderness. No edema.  Skin:    General: Skin is warm  and dry.  Neurological:     Mental Status: She is alert.    ED Results / Procedures / Treatments   Labs (all labs ordered are listed, but only abnormal results are displayed) Labs Reviewed  BASIC METABOLIC PANEL - Abnormal; Notable for the following components:      Result Value   Glucose, Bld 112 (*)    Calcium 8.7 (*)    All other components within normal limits  HEPATIC FUNCTION PANEL - Abnormal; Notable for the following components:   Total Protein 4.3 (*)    Albumin 2.6 (*)    AST 10 (*)    Total Bilirubin 0.2 (*)    All other components within normal limits  TROPONIN I (HIGH SENSITIVITY) - Abnormal; Notable for the following components:   Troponin I (High Sensitivity) 62 (*)    All other components within normal limits  RESP PANEL BY RT-PCR (FLU A&B, COVID) ARPGX2  CBC  LIPASE, BLOOD  TROPONIN I (HIGH SENSITIVITY)    EKG EKG Interpretation  Date/Time:  Tuesday March 10 2021 20:23:13 EDT Ventricular Rate:  72 PR Interval:  150 QRS Duration: 82 QT Interval:  410 QTC Calculation: 448 R Axis:   56 Text Interpretation: Normal sinus rhythm Anteroseptal infarct , age undetermined Abnormal ECG When compared to prior, similar apperance. No sTEMI Confirmed by Antony Blackbird [id number]) on 03/10/2021 8:37:06 PM  Radiology DG Chest 2 View  Result Date: 03/10/2021 CLINICAL DATA:  Chest pain EXAM: CHEST - 2 VIEW COMPARISON:  04/11/2012 FINDINGS: Heart and mediastinal contours are within normal limits. No focal opacities or effusions. No acute bony abnormality. IMPRESSION: No active cardiopulmonary  disease. Electronically Signed   By: Rolm Baptise M.D.   On: 03/10/2021 21:06    Procedures Procedures   Medications Ordered in ED Medications  nitroGLYCERIN (NITROSTAT) SL tablet 0.4 mg (0.4 mg Sublingual Given 03/10/21 2338)  hydrALAZINE (APRESOLINE) injection 10 mg (10 mg Intravenous Given 03/10/21 2238)  aspirin chewable tablet 162 mg (162 mg Oral Given 03/10/21 2338)  iohexol (OMNIPAQUE) 350 MG/ML injection 100 mL (100 mLs Intravenous Contrast Given 03/10/21 2309)    ED Course  I have reviewed the triage vital signs and the nursing notes.  Pertinent labs & imaging results that were available during my care of the patient were reviewed by me and considered in my medical decision making (see chart for details).  Clinical Course as of 03/11/21 0007  Tue Mar 10, 2021  2115 EKG 12-Lead [LH]    Clinical Course User Index [LH] Felecia Jan   MDM Rules/Calculators/A&P                          Khalia Gong is a 70 y.o. female with a past medical history significant for hypertension, hyperlipidemia, Parkinson's, and hypothyroidism who presents with sudden onset of severe pain in her upper abdomen and chest as well as significant shortness of breath.  Patient reports that her father had MI in his 38s and died in his 82s from cardiac complications.  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  She reports no significant nausea or vomiting but thought she may have been sweaty and had some feeling overheated with it.  She reports no constipation, diarrhea, or urinary changes.  Denies any new  recent leg pain or leg swelling.  Denies history of DVT or PE.  She reports the pain was 7 or 8 out of 10 in severity onset but has now improved to about a 4 out of 10.  She is unsure if it is exertional or pleuritic.  She denies  any recent cough, congestion, or other URI symptoms.  This hit her all of a sudden.  On my initial evaluation, blood pressures over 295 systolic.  On exam, lungs are clear and I cannot reproduce her discomfort.  No murmur.  Good pulses in extremities however.  Abdomen is nontender and back was nontender.  Patient otherwise very anxious.  EKG does not show STEMI.  Due to the pain in her upper abdomen, chest, going to her upper shoulders and near her back as well as her blood pressure over 200, I do feel we need to get some imaging to rule out acute dissection given the rapidity of onset and her symptoms.  This will also help rule out some large saddle PE.  Will get other screening labs and a COVID test.  Given her strong family history of cardiac disease and her comorbidities and her description of symptoms, will get work-up including delta troponin to make sure she is not a cardiac cause of symptoms.  Heart score calculated as a 6.  Given the high concern, I do anticipate she will require admission for high risk chest pain when work-up is completed.  We will give some hydralazine for her blood pressure over 200 with a heart rate in the 60s.  11:58 PM Dissection study did not show evidence of aneurysm or dissection.  No large PE seen either.  Right renal artery still shows evidence of fibromuscular dysplasia.  Troponin initially was normal but is now rising.  Given the heart score of 6, concerning chest pain, and rising troponin, will call cardiology for recommendations.  Anticipate admission for chest pain.   12:07 AM On reassessment, patient is now chest pain-free after the nitro completely took her pain away.  Final Clinical Impression(s) / ED Diagnoses Final diagnoses:  Chest pain, unspecified type  Shortness of breath  Elevated troponin    Clinical Impression: 1. Chest pain, unspecified type   2. Shortness of breath   3. Elevated troponin     Disposition: Admit  This note was  prepared with assistance of Dragon voice recognition software. Occasional wrong-word or sound-a-like substitutions may have occurred due to the inherent limitations of voice recognition software.     Sukhman Martine, Gwenyth Allegra, MD 03/11/21 [phone]

## 2021-03-10 NOTE — ED Notes (Signed)
Pt reports cessation of chest pain at this time.  Subsequent nitro 0.4 mg SL admin held

## 2021-03-10 NOTE — ED Triage Notes (Signed)
Chest pressure into her shoulders and neck. She feels sob. Sudden onset of heaviness. EKG at triage.

## 2021-03-11 ENCOUNTER — Encounter (HOSPITAL_COMMUNITY)
Admission: EM | Disposition: A | Discharge status: Home / Self Care | Payer: Self-pay | Source: Home / Self Care | Attending: Emergency Medicine

## 2021-03-11 ENCOUNTER — Observation Stay (HOSPITAL_COMMUNITY): Payer: Medicare Other

## 2021-03-11 DIAGNOSIS — R778 Other specified abnormalities of plasma proteins: Secondary | ICD-10-CM

## 2021-03-11 DIAGNOSIS — R079 Chest pain, unspecified: Secondary | ICD-10-CM | POA: Diagnosis not present

## 2021-03-11 DIAGNOSIS — G20 Parkinson's disease: Secondary | ICD-10-CM | POA: Diagnosis not present

## 2021-03-11 DIAGNOSIS — I214 Non-ST elevation (NSTEMI) myocardial infarction: Secondary | ICD-10-CM | POA: Diagnosis not present

## 2021-03-11 DIAGNOSIS — I10 Essential (primary) hypertension: Secondary | ICD-10-CM

## 2021-03-11 DIAGNOSIS — R7989 Other specified abnormal findings of blood chemistry: Secondary | ICD-10-CM

## 2021-03-11 HISTORY — PX: LEFT HEART CATH AND CORONARY ANGIOGRAPHY: CATH118249

## 2021-03-11 LAB — TROPONIN I (HIGH SENSITIVITY)
Troponin I (High Sensitivity): 104 ng/L (ref <18)
Troponin I (High Sensitivity): 174 ng/L (ref <18)
Troponin I (High Sensitivity): 210 ng/L (ref <18)

## 2021-03-11 LAB — HEPARIN LEVEL (UNFRACTIONATED)
Heparin Unfractionated: 0.11 IU/mL — ABNORMAL LOW (ref 0.30–0.70)
Heparin Unfractionated: 0.69 IU/mL (ref 0.30–0.70)

## 2021-03-11 LAB — HIV ANTIBODY (ROUTINE TESTING W REFLEX): HIV Screen 4th Generation wRfx: NONREACTIVE

## 2021-03-11 SURGERY — LEFT HEART CATH AND CORONARY ANGIOGRAPHY
Anesthesia: LOCAL

## 2021-03-11 MED ORDER — VERAPAMIL HCL 2.5 MG/ML IV SOLN
INTRAVENOUS | Status: AC
  Filled 2021-03-11: qty 2

## 2021-03-11 MED ORDER — HEPARIN (PORCINE) 25000 UT/250ML-% IV SOLN
950.0000 [IU]/h | INTRAVENOUS | Status: DC
  Administered 2021-03-11: 750 [IU]/h via INTRAVENOUS
  Filled 2021-03-11: qty 250

## 2021-03-11 MED ORDER — ACETAMINOPHEN 650 MG RE SUPP: 650.0000 mg | Freq: Four times a day (QID) | RECTAL | Status: DC | PRN

## 2021-03-11 MED ORDER — ASPIRIN EC 325 MG PO TBEC
325.0000 mg | DELAYED_RELEASE_TABLET | Freq: Every day | ORAL | Status: DC
  Administered 2021-03-11: 325 mg via ORAL
  Filled 2021-03-11: qty 1

## 2021-03-11 MED ORDER — ACETAMINOPHEN 325 MG PO TABS
650.0000 mg | ORAL_TABLET | Freq: Four times a day (QID) | ORAL | Status: DC | PRN
  Administered 2021-03-11: 650 mg via ORAL
  Filled 2021-03-11: qty 2

## 2021-03-11 MED ORDER — SODIUM CHLORIDE 0.9 % IV SOLN: 250.0000 mL | INTRAVENOUS | Status: DC | PRN

## 2021-03-11 MED ORDER — MIDAZOLAM HCL 2 MG/2ML IJ SOLN
INTRAMUSCULAR | Status: AC
  Filled 2021-03-11: qty 2

## 2021-03-11 MED ORDER — HYDRALAZINE HCL 20 MG/ML IJ SOLN: 10.0000 mg | INTRAMUSCULAR | Status: AC | PRN

## 2021-03-11 MED ORDER — LIDOCAINE HCL (PF) 1 % IJ SOLN
INTRAMUSCULAR | Status: AC
  Filled 2021-03-11: qty 30

## 2021-03-11 MED ORDER — CARBIDOPA-LEVODOPA 25-100 MG PO TABS
1.0000 | ORAL_TABLET | Freq: Three times a day (TID) | ORAL | Status: DC
  Administered 2021-03-11 – 2021-03-12 (×5): 1 via ORAL
  Filled 2021-03-11 (×7): qty 1

## 2021-03-11 MED ORDER — FENTANYL CITRATE (PF) 100 MCG/2ML IJ SOLN
INTRAMUSCULAR | Status: AC
  Filled 2021-03-11: qty 2

## 2021-03-11 MED ORDER — MIDAZOLAM HCL 2 MG/2ML IJ SOLN
INTRAMUSCULAR | Status: DC | PRN
  Administered 2021-03-11: 2 mg via INTRAVENOUS
  Administered 2021-03-11: 1 mg via INTRAVENOUS

## 2021-03-11 MED ORDER — SODIUM CHLORIDE 0.9% FLUSH: 3.0000 mL | INTRAVENOUS | Status: DC | PRN

## 2021-03-11 MED ORDER — HEPARIN SODIUM (PORCINE) 1000 UNIT/ML IJ SOLN
INTRAMUSCULAR | Status: AC
  Filled 2021-03-11: qty 1

## 2021-03-11 MED ORDER — LABETALOL HCL 5 MG/ML IV SOLN: 10.0000 mg | INTRAVENOUS | Status: AC | PRN

## 2021-03-11 MED ORDER — VERAPAMIL HCL 2.5 MG/ML IV SOLN
INTRAVENOUS | Status: DC | PRN
  Administered 2021-03-11: 10 mL via INTRA_ARTERIAL

## 2021-03-11 MED ORDER — HEPARIN SODIUM (PORCINE) 5000 UNIT/ML IJ SOLN
5000.0000 [IU] | Freq: Three times a day (TID) | INTRAMUSCULAR | Status: DC
  Administered 2021-03-11 – 2021-03-12 (×2): 5000 [IU] via SUBCUTANEOUS
  Filled 2021-03-11 (×2): qty 1

## 2021-03-11 MED ORDER — DIAZEPAM 5 MG PO TABS: 5.0000 mg | ORAL_TABLET | Freq: Four times a day (QID) | ORAL | Status: DC | PRN

## 2021-03-11 MED ORDER — SODIUM CHLORIDE 0.9% FLUSH
3.0000 mL | Freq: Two times a day (BID) | INTRAVENOUS | Status: DC
  Administered 2021-03-11: 3 mL via INTRAVENOUS

## 2021-03-11 MED ORDER — SODIUM CHLORIDE 0.9 % IV SOLN: INTRAVENOUS | Status: AC

## 2021-03-11 MED ORDER — AMLODIPINE BESYLATE 5 MG PO TABS
5.0000 mg | ORAL_TABLET | Freq: Every day | ORAL | Status: DC
  Administered 2021-03-12: 5 mg via ORAL
  Filled 2021-03-11 (×2): qty 1

## 2021-03-11 MED ORDER — LIDOCAINE HCL (PF) 1 % IJ SOLN
INTRAMUSCULAR | Status: DC | PRN
  Administered 2021-03-11: 3 mL via INTRADERMAL

## 2021-03-11 MED ORDER — HEPARIN (PORCINE) IN NACL 1000-0.9 UT/500ML-% IV SOLN
INTRAVENOUS | Status: AC
  Filled 2021-03-11: qty 1000

## 2021-03-11 MED ORDER — FENTANYL CITRATE (PF) 100 MCG/2ML IJ SOLN
INTRAMUSCULAR | Status: DC | PRN
  Administered 2021-03-11 (×2): 25 ug via INTRAVENOUS

## 2021-03-11 MED ORDER — HEPARIN (PORCINE) IN NACL 1000-0.9 UT/500ML-% IV SOLN
INTRAVENOUS | Status: DC | PRN
  Administered 2021-03-11 (×2): 500 mL

## 2021-03-11 MED ORDER — HEPARIN BOLUS VIA INFUSION
2000.0000 [IU] | Freq: Once | INTRAVENOUS | Status: AC
  Administered 2021-03-11: 2000 [IU] via INTRAVENOUS
  Filled 2021-03-11: qty 2000

## 2021-03-11 MED ORDER — HYDRALAZINE HCL 20 MG/ML IJ SOLN: 10.0000 mg | Freq: Once | INTRAMUSCULAR | Status: DC

## 2021-03-11 MED ORDER — ONDANSETRON HCL 4 MG PO TABS: 4.0000 mg | ORAL_TABLET | Freq: Four times a day (QID) | ORAL | Status: DC | PRN

## 2021-03-11 MED ORDER — MORPHINE SULFATE (PF) 2 MG/ML IV SOLN
1.0000 mg | INTRAVENOUS | Status: DC | PRN
Start: 2021-03-11 — End: 2021-03-12

## 2021-03-11 MED ORDER — ACETAMINOPHEN 325 MG PO TABS: 650.0000 mg | ORAL_TABLET | Freq: Four times a day (QID) | ORAL | Status: DC | PRN

## 2021-03-11 MED ORDER — SODIUM CHLORIDE 0.9% FLUSH
3.0000 mL | Freq: Two times a day (BID) | INTRAVENOUS | Status: DC
  Administered 2021-03-12: 3 mL via INTRAVENOUS

## 2021-03-11 MED ORDER — ONDANSETRON HCL 4 MG/2ML IJ SOLN: 4.0000 mg | Freq: Four times a day (QID) | INTRAMUSCULAR | Status: DC | PRN

## 2021-03-11 MED ORDER — IOHEXOL 350 MG/ML SOLN
INTRAVENOUS | Status: DC | PRN
  Administered 2021-03-11: 60 mL

## 2021-03-11 MED ORDER — ACETAMINOPHEN 325 MG PO TABS: 650.0000 mg | ORAL_TABLET | ORAL | Status: DC | PRN

## 2021-03-11 MED ORDER — PANTOPRAZOLE SODIUM 40 MG PO TBEC
40.0000 mg | DELAYED_RELEASE_TABLET | Freq: Every day | ORAL | Status: DC
  Administered 2021-03-11 – 2021-03-12 (×2): 40 mg via ORAL
  Filled 2021-03-11 (×2): qty 1

## 2021-03-11 MED ORDER — SODIUM CHLORIDE 0.9 % WEIGHT BASED INFUSION
3.0000 mL/kg/h | INTRAVENOUS | Status: DC
  Administered 2021-03-11: 3 mL/kg/h via INTRAVENOUS

## 2021-03-11 MED ORDER — HEPARIN BOLUS VIA INFUSION
3000.0000 [IU] | Freq: Once | INTRAVENOUS | Status: AC
  Administered 2021-03-11: 3000 [IU] via INTRAVENOUS

## 2021-03-11 MED ORDER — ASPIRIN 81 MG PO CHEW
81.0000 mg | CHEWABLE_TABLET | Freq: Every day | ORAL | Status: DC
  Administered 2021-03-12: 81 mg via ORAL
  Filled 2021-03-11: qty 1

## 2021-03-11 MED ORDER — SODIUM CHLORIDE 0.9 % WEIGHT BASED INFUSION
1.0000 mL/kg/h | INTRAVENOUS | Status: DC
  Administered 2021-03-11: 1 mL/kg/h via INTRAVENOUS

## 2021-03-11 MED ORDER — METOPROLOL TARTRATE 25 MG PO TABS
25.0000 mg | ORAL_TABLET | Freq: Two times a day (BID) | ORAL | Status: DC
  Administered 2021-03-11 – 2021-03-12 (×3): 25 mg via ORAL
  Filled 2021-03-11 (×3): qty 1

## 2021-03-11 MED ORDER — MESALAMINE ER 250 MG PO CPCR
1000.0000 mg | ORAL_CAPSULE | Freq: Two times a day (BID) | ORAL | Status: DC
  Administered 2021-03-11 – 2021-03-12 (×3): 1000 mg via ORAL
  Filled 2021-03-11 (×4): qty 4

## 2021-03-11 MED ORDER — HEPARIN SODIUM (PORCINE) 1000 UNIT/ML IJ SOLN
INTRAMUSCULAR | Status: DC | PRN
  Administered 2021-03-11: 3000 [IU] via INTRAVENOUS

## 2021-03-11 MED ORDER — ACETAMINOPHEN 325 MG PO TABS
650.0000 mg | ORAL_TABLET | ORAL | Status: DC | PRN
  Administered 2021-03-11: 650 mg via ORAL
  Filled 2021-03-11: qty 2

## 2021-03-11 MED ORDER — ATORVASTATIN CALCIUM 40 MG PO TABS
40.0000 mg | ORAL_TABLET | Freq: Every day | ORAL | Status: DC
  Administered 2021-03-11 – 2021-03-12 (×2): 40 mg via ORAL
  Filled 2021-03-11 (×2): qty 1

## 2021-03-11 SURGICAL SUPPLY — 11 items
CATH INFINITI JR4 5F (CATHETERS) ×1 IMPLANT
CATH OPTITORQUE TIG 4.0 5F (CATHETERS) ×1 IMPLANT
DEVICE RAD COMP TR BAND LRG (VASCULAR PRODUCTS) ×2 IMPLANT
GLIDESHEATH SLEND SS 6F .021 (SHEATH) ×2 IMPLANT
KIT HEART LEFT (KITS) ×2 IMPLANT
PACK CARDIAC CATHETERIZATION (CUSTOM PROCEDURE TRAY) ×2 IMPLANT
SHEATH PROBE COVER 6X72 (BAG) ×2 IMPLANT
TRANSDUCER W/STOPCOCK (MISCELLANEOUS) ×2 IMPLANT
TUBING CIL FLEX 10 FLL-RA (TUBING) ×2 IMPLANT
WIRE HI TORQ VERSACORE-J 145CM (WIRE) ×2 IMPLANT
WIRE STARTER ROSEN .035X260 (WIRE) ×2 IMPLANT

## 2021-03-11 NOTE — ED Notes (Signed)
IP RN to return call for report

## 2021-03-11 NOTE — Plan of Care (Signed)

## 2021-03-11 NOTE — H&P (Addendum)
HISTORY AND PHYSICAL       PATIENT DETAILS Name: Melinda Lane Age: 70 y.o. Sex: female Date of Birth: Feb 17, 1951 Admit Date: 03/10/2021 [name], [name], MD   Patient coming from: Transfer from [place]:  Chest Pain  HPI: Melinda Lane is a 70 y.o. female with medical history significant of HTN, HLD, Crohn's disease, Parkinson's disease who presented with chest pain.  Per patient-she was at her hairdresser's yesterday-and had sudden onset of retrosternal chest pain (at rest/while sitting) that she describes as heaviness/elephant sitting on her chest.  This radiated to her neck and towards her left shoulder.  She rates the pain around 7/10 in intensity-lasted close to 2.5 hours.  She had some mild shortness of breath and diaphoresis associated with the pain.  There was no associated nausea or vomiting.  Her husband took her to [name] she was given 2 doses of sublingual nitroglycerin with significant improvement in her chest pain.  ED MD-spoke with cardiology on-call-they were advised to start IV heparin and admit the patient to the hospitalist service.  Per patient-she is fairly active-plays golf-can goes to the trampoline park with her grand-daughter.  She has never had any prior chest pain in the past.  She does have family history of heart disease-heart father started having cardiac issues in his early 39s and required a bypass.   Patient denies any fever, nausea, vomiting, diarrhea, abdominal pain.  ED Course: Presented with chest pain-systolic blood pressure initially was more than 200-underwent CT angio chest/abdomen-no evidence of thoracoabdominal aortic aneurysm or dissection.  The central pulmonary arteries are of normal caliber.  Patient was given sublingual nitroglycerin-and pain completely resolved.  Cardiology was consulted-patient was started on heparin infusion-and subsequently transferred to the hospitalist  service.  Note: Lives at: Home Mobility: Independent Chronic Indwelling Foley:no   REVIEW OF SYSTEMS:  Constitutional:   No  weight loss, night sweats,  Fevers, chills, fatigue.  HEENT:    No headaches, Dysphagia,Tooth/dental problems,Sore throat,  No sneezing, itching, ear ache, nasal congestion, post nasal drip  Cardio-vascular: No Orthopnea, PND,lower extremity edema, anasarca, palpitations  GI:  No heartburn, indigestion, abdominal pain, nausea, vomiting, diarrhea, melena or hematochezia  Resp: No shortness of breath, cough, hemoptysis,plueritic chest pain.   Skin:  No rash or lesions.  GU:  No dysuria, change in color of urine, no urgency or frequency.  No flank pain.  Musculoskeletal: No joint pain or swelling.  No decreased range of motion.  No back pain.  Endocrine: No heat intolerance, no cold intolerance, no polyuria, no polydipsia  Psych: No change in mood or affect. No depression or anxiety.  No memory loss.   ALLERGIES:   Allergies  Allergen Reactions   Zithromax [Azithromycin] Rash    PAST MEDICAL HISTORY: Past Medical History:  Diagnosis Date   Hypertension    Hypothyroidism    Parkinson's disease ([place]) 07/24/2019   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(HCC)     PAST SURGICAL HISTORY: Past Surgical History:  Procedure Laterality Date   APPENDECTOMY     AUGMENTATION MAMMAPLASTY     saline - 2015   CHOLECYSTECTOMY     ECTOPIC PREGNANCY SURGERY     KNEE SURGERY     TONSILLECTOMY      MEDICATIONS AT HOME: Prior to Admission medications   Medication Sig Start Date End Date Taking? Authorizing Provider  amLODipine (NORVASC) 2.5 MG tablet Take 2.5 mg by mouth  daily.   Yes [provider]  carbidopa-levodopa (SINEMET IR) 25-100 MG tablet TAKE 1/2 TABLET BY MOUTH THREE TIMES DAILY FOR 4 WEEKS THEN TAKE 1 TABLET BY MOUTH THREE TIMES DAILY 02/27/20  Yes Kathrynn Ducking, MD  ferrous sulfate 325 (65 FE) MG tablet Take 325 mg by mouth daily  with breakfast.   Yes [provider]  meloxicam (MOBIC) 15 MG tablet Take 15 mg by mouth daily.   Yes [provider]  mesalamine (PENTASA) 500 MG CR capsule Take 1,000 mg by mouth 2 (two) times daily.    Yes [provider]  Multiple Vitamin (MULTIVITAMIN) tablet Take 1 tablet by mouth daily.   Yes [provider]  Omega-3 Fatty Acids (FISH OIL PO) Take 1 capsule by mouth daily.   Yes [provider]  progesterone (PROMETRIUM) 200 MG capsule Take 200 mg by mouth daily.   Yes [provider]  telmisartan (MICARDIS) 40 MG tablet Take 40 mg by mouth daily.   Yes [provider]  vitamin E 100 UNIT capsule Take 100 Units by mouth daily.   Yes [provider]  aspirin 81 MG tablet Take 81 mg by mouth daily.    [provider]  calcium carbonate 1250 MG capsule Take 1,250 mg by mouth 2 (two) times daily with a meal.    [provider]  cholecalciferol (VITAMIN D) 1000 UNITS tablet Take 1,000 Units by mouth daily.    [provider]  ibuprofen (ADVIL,MOTRIN) 600 MG tablet Take 1 tablet (600 mg total) by mouth every 6 (six) hours as needed. 11/12/14   Jeannett Senior, PA-C  NONFORMULARY OR COMPOUNDED ITEM Estrogen and Testosterone pellets    [provider]    FAMILY HISTORY: Family History  Problem Relation Age of Onset   Hypertension Father    Heart disease Father    Hypertension Mother    Heart disease Mother      SOCIAL HISTORY:  reports that she has never smoked. She has never used smokeless tobacco. She reports current alcohol use. She reports that she does not use drugs.  PHYSICAL EXAM: Blood pressure (!) 151/62, pulse 71, temperature 97.8 F (36.6 C), temperature source Oral, resp. rate 16, height 5' 4.5" (1.638 m), weight 59.1 kg, SpO2 100 %.  General appearance :Awake, alert, not in any distress.  Eyes:, pupils equally reactive to light and accomodation,no scleral  icterus.Pink conjunctiva HEENT: Atraumatic and Normocephalic Neck: supple, no JVD.  Resp:Good air entry bilaterally, no added sounds  CVS: S1 S2 regular, no murmurs.  GI: Bowel sounds present, Non tender and not distended with no gaurding, rigidity or rebound. Extremities: B/L Lower Ext shows no edema, both legs are warm to touch Neurology:  speech clear,Non focal, sensation is grossly intact. Psychiatric: Normal judgment and insight. Alert and oriented x 3. Normal mood. Musculoskeletal:gait appears to be normal.No digital cyanosis Skin:No Rash, warm and dry Wounds:N/A  LABS ON ADMISSION:  I have personally reviewed following labs and imaging studies  CBC: Recent Labs  Lab 03/10/21 2030  WBC 7.6  HGB 15.0  HCT 44.0  MCV 95.0  PLT 967    Basic Metabolic Panel: Recent Labs  Lab 03/10/21 2030  NA 137  K 3.9  CL 100  CO2 30  GLUCOSE 112*  BUN 20  CREATININE 0.76  CALCIUM 8.7*    GFR: Estimated Creatinine Clearance: 60.2 mL/min (by C-G formula based on SCr of 0.76 mg/dL).  Liver Function Tests: Recent Labs  Lab 03/10/21 2236  AST 10*  ALT 5  ALKPHOS 46  BILITOT 0.2*  PROT 4.3*  ALBUMIN 2.6*   Recent Labs  Lab 03/10/21 2236  LIPASE 31   No results for input(s): AMMONIA in the last 168 hours.  Coagulation Profile: No results for input(s): INR, PROTIME in the last 168 hours.  Cardiac Enzymes: No results for input(s): CKTOTAL, CKMB, CKMBINDEX, TROPONINI in the last 168 hours.  BNP (last 3 results) No results for input(s): PROBNP in the last 8760 hours.  HbA1C: No results for input(s): HGBA1C in the last 72 hours.  CBG: No results for input(s): GLUCAP in the last 168 hours.  Lipid Profile: No results for input(s): CHOL, HDL, LDLCALC, TRIG, CHOLHDL, LDLDIRECT in the last 72 hours.  Thyroid Function Tests: No results for input(s): TSH, T4TOTAL, FREET4, T3FREE, THYROIDAB in the last 72 hours.  Anemia Panel: No results for input(s): VITAMINB12,  FOLATE, FERRITIN, TIBC, IRON, RETICCTPCT in the last 72 hours.  Urine analysis:    Component Value Date/Time   COLORURINE YELLOW 11/12/2014 1655   APPEARANCEUR CLEAR 11/12/2014 1655   LABSPEC 1.003 (L) 11/12/2014 1655   PHURINE 6.0 11/12/2014 1655   GLUCOSEU NEGATIVE 11/12/2014 1655   HGBUR NEGATIVE 11/12/2014 1655   BILIRUBINUR NEGATIVE 11/12/2014 1655   KETONESUR NEGATIVE 11/12/2014 1655   PROTEINUR NEGATIVE 11/12/2014 1655   UROBILINOGEN 0.2 11/12/2014 1655   NITRITE NEGATIVE 11/12/2014 1655   LEUKOCYTESUR TRACE (A) 11/12/2014 1655    Sepsis Labs: Lactic Acid, Venous No results found for: LATICACIDVEN   Microbiology: Recent Results (from the past 240 hour(s))  Resp Panel by RT-PCR (Flu A&B, Covid) Nasopharyngeal Swab     Status: None   Collection Time: 03/10/21 10:36 PM   Specimen: Nasopharyngeal Swab; Nasopharyngeal(NP) swabs in vial transport medium  Result Value Ref Range Status   SARS Coronavirus 2 by RT PCR NEGATIVE NEGATIVE Final    Comment: (NOTE) SARS-CoV-2 target nucleic acids are NOT DETECTED.  The SARS-CoV-2 RNA is generally detectable in upper respiratory specimens during the acute phase of infection. The lowest concentration of SARS-CoV-2 viral copies this assay can detect is 138 copies/mL. A negative result does not preclude SARS-Cov-2 infection and should not be used as the sole basis for treatment or other patient management decisions. A negative result may occur with  improper specimen collection/handling, submission of specimen other than nasopharyngeal swab, presence of viral mutation(s) within the areas targeted by this assay, and inadequate number of viral copies(<138 copies/mL). A negative result must be combined with clinical observations, patient history, and epidemiological information. The expected result is Negative.  Fact Sheet for Patients:  EntrepreneurPulse.com.au  Fact Sheet for Healthcare Providers:   IncredibleEmployment.be  This test is no t yet approved or cleared by the [place] FDA and  has been authorized for detection and/or diagnosis of SARS-CoV-2 by FDA under an Emergency Use Authorization (EUA). This EUA will remain  in effect (meaning this test can be used) for the duration of the COVID-19 declaration under Section 564(b)(1) of the Act, 21 U.S.C.section 360bbb-3(b)(1), unless the authorization is terminated  or revoked sooner.       Influenza A by PCR NEGATIVE NEGATIVE Final   Influenza B by PCR NEGATIVE NEGATIVE Final    Comment: (NOTE) The Xpert Xpress SARS-CoV-2/FLU/RSV plus assay is intended as an aid in the diagnosis of influenza from Nasopharyngeal swab specimens and should not be used as a sole basis for treatment. Nasal washings and aspirates are unacceptable for Xpert  Xpress SARS-CoV-2/FLU/RSV testing.  Fact Sheet for Patients: EntrepreneurPulse.com.au  Fact Sheet for Healthcare Providers: IncredibleEmployment.be  This test is not yet approved or cleared by the [place] FDA and has been authorized for detection and/or diagnosis of SARS-CoV-2 by FDA under an Emergency Use Authorization (EUA). This EUA will remain in effect (meaning this test can be used) for the duration of the COVID-19 declaration under Section 564(b)(1) of the Act, 21 U.S.C. section 360bbb-3(b)(1), unless the authorization is terminated or revoked.  Performed at [facility], [address] [place], [place] [address]       RADIOLOGIC STUDIES ON ADMISSION: DG Chest 2 View  Result Date: 03/10/2021 CLINICAL DATA:  Chest pain EXAM: CHEST - 2 VIEW COMPARISON:  04/11/2012 FINDINGS: Heart and mediastinal contours are within normal limits. No focal opacities or effusions. No acute bony abnormality. IMPRESSION: No active cardiopulmonary disease. Electronically Signed   By: Rolm Baptise M.D.   On: 03/10/2021 21:06    CT Angio Chest/Abd/Pel for Dissection W and/or Wo Contrast  Result Date: 03/10/2021 CLINICAL DATA:  Chest pain, back pain, hypertension, dyspnea EXAM: CT ANGIOGRAPHY CHEST, ABDOMEN AND PELVIS TECHNIQUE: Non-contrast CT of the chest was initially obtained. Multidetector CT imaging through the chest, abdomen and pelvis was performed using the standard protocol during bolus administration of intravenous contrast. Multiplanar reconstructed images and MIPs were obtained and reviewed to evaluate the vascular anatomy. CONTRAST:  156mL OMNIPAQUE IOHEXOL 350 MG/ML SOLN COMPARISON:  None. FINDINGS: CTA CHEST FINDINGS Cardiovascular: The thoracic aorta is of normal caliber. No intramural hematoma, dissection, or aneurysm. Minimal atherosclerotic calcification within the descending thoracic aorta. Arch vasculature demonstrates normal anatomic configuration and is widely patent proximally. No significant coronary artery calcification. Global cardiac size within normal limits. No pericardial effusion. The central pulmonary arteries are of normal caliber. Mediastinum/Nodes: Multinodular thyroid with progressive enlargement of dominant right thyroid nodule now measuring 2.1 cm in greatest dimension. No pathologic thoracic adenopathy. Esophagus unremarkable. Lungs/Pleura: Lungs are clear. No pleural effusion or pneumothorax. Musculoskeletal: Bilateral breast implants are noted. No acute bone abnormality. Review of the MIP images confirms the above findings. CTA ABDOMEN AND PELVIS FINDINGS VASCULAR Aorta: Minimal atherosclerotic calcification. Normal caliber. No aneurysm or dissection. No periaortic inflammatory change. Celiac: Less than 50% stenosis of the a origin of the celiac axis. Distally widely patent. No aneurysm or dissection. SMA: Widely patent.  No aneurysm or dissection. Renals: Single renal arteries are widely patent bilaterally. Beaded appearance of the mid segment of the right renal arteries in keeping with  changes of fibromuscular dysplasia. No aneurysm. IMA: Widely patent Inflow: Widely patent.  No aneurysm or dissection. 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 There is narrowing of the left renal vein as it crosses the aorta, however, and this may, in part, represent collateralization of the left renal vein secondary to a central stenosis. Review of the MIP images confirms the above findings. NON-VASCULAR Hepatobiliary: No focal liver abnormality is seen. Status post cholecystectomy. No biliary dilatation. Pancreas: Unremarkable Spleen: Unremarkable Adrenals/Urinary Tract: The adrenal glands are unremarkable. The kidneys are normal in size and position. Simple exophytic cortical cyst arises from the upper pole of the left kidney. The kidneys are otherwise unremarkable. The bladder is unremarkable. Stomach/Bowel: The stomach, small bowel, and large bowel are unremarkable. No free intraperitoneal gas or fluid. Lymphatic: No pathologic adenopathy within the abdomen and pelvis. Reproductive:  Aside from adnexal varices, the pelvic organs are unremarkable. Other: No abdominal wall hernia.  Rectum is unremarkable Musculoskeletal: No acute bone abnormality. No lytic or blastic bone lesion. Review of the MIP images confirms the above findings. IMPRESSION: No evidence of thoracoabdominal aortic aneurysm or dissection. Minimal atherosclerotic plaque. No acute intrathoracic or intra-abdominal pathology identified. Enlarging dominant right thyroid nodule. Recommend thyroid US (ref: J Am Coll Radiol. 2015 Feb;12(2): 143-50). Beaded appearance of the a right renal artery in keeping with changes of fibromuscular dysplasia. Marked dilation of the ovarian veins bilaterally, with extensive bilateral adnexal varices. Narrowing of the left renal vein centrally. 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 This appears unchanged from prior examination. Aortic Atherosclerosis (ICD10-I70.0). Electronically Signed   By: Fidela Salisbury MD   On: 03/10/2021 23:53    I have personally reviewed images of chest xray-no obvious pneumonia.  EKG:  Personally reviewed.  NSR-nonspecific ST changes.  ASSESSMENT AND PLAN: NSTEMI/Chest pain: With typical features-initial troponin negative-but subsequent troponins are mildly elevated.  Currently she is chest pain-free.  Add aspirin, statin, beta-blocker-continue with IV heparin.  Have consulted cardiology.  Will await cardiology opinion regarding next step in management.  We will trend another set of troponins  HTN: BP on the higher side-we will start beta-blockers-follow and optimize accordingly.  Parkinson's disease: Continue Sinemet  Crohn's disease: Appears stable-continue mesalamine  Enlarging dominant right thyroid nodule: Seen incidentally on CT imaging-needs outpatient work-up.  Patient aware of findings per ED note.  Further plan will depend as patient's clinical course evolves and further radiologic and laboratory data become available. Patient will be monitored closely.  Above noted plan was discussed with patient face to face at bedside, she was in agreement.   CONSULTS: Cardiology   DVT Prophylaxis: IV heparin  Code Status: Full Code  Disposition Plan:  Discharge back home  in 1-2 days, depending on clinical course  Admission status: Inpatient  going to tele  The medical decision making on this patient was of high complexity and the patient is at high risk for clinical deterioration, therefore this is a level 3 visit.    Total time spent  55 minutes.Greater than 50% of this time was spent in counseling, explanation of diagnosis, planning of further management, and  coordination of care.  Severity of illness: The appropriate patient status for this patient is INPATIENT. Inpatient status is judged to be reasonable and necessary in order to provide the required intensity of service to ensure the patient's safety. The patient's presenting symptoms, physical exam findings, and initial radiographic and laboratory data in the context of their chronic comorbidities is felt to place them at high risk for further clinical deterioration. Furthermore, it is not anticipated that the patient will be medically stable for discharge from the hospital within 2 midnights of admission. The following factors support the patient status of inpatient.   " The patient's presenting symptoms include chest pain " The worrisome physical exam findings include uncontrolled hypertension " The initial radiographic and laboratory data are worrisome because of elevated troponins " The chronic co-morbidities include HTN, Crohn's disease, Parkinson's disease   * I certify that at the point of admission it is my clinical judgment that the patient will require inpatient hospital care spanning beyond 2 midnights from the point of admission due to high intensity of service, high risk for further  deterioration and high frequency of surveillance required.  Oren Binet [facility] Pager [phone]  If 7PM-7AM, please contact night-coverage  Please page via [email or web address]  Go to [email or web address] and use [place]'s universal password to access. If you do not have the password, please contact the hospital operator.  Locate the [facility] provider you are looking for under [facility] and page to a number that you can be directly reached. If you still have difficulty reaching the provider, please page the [facility] (Director on Call) for the Hospitalists listed on amion for assistance.  03/11/2021, 7:12 AM

## 2021-03-11 NOTE — ED Notes (Signed)
Pt reports mild return of CP while talking with [facility]

## 2021-03-11 NOTE — Progress Notes (Signed)
ANTICOAGULATION CONSULT NOTE  Pharmacy Consult for Heparin Indication: chest pain/ACS  Allergies  Allergen Reactions   Zithromax [Azithromycin] Rash    Patient Measurements: Height: 5' 4.5" (163.8 cm) Weight: 59.1 kg (130 lb 4.8 oz) IBW/kg (Calculated) : 55.85  Vital Signs: Temp: 97.8 F (36.6 C) (06/29 0430) Temp Source: Oral (06/29 0430) BP: 151/62 (06/29 0430) Pulse Rate: 71 (06/29 0430)  Labs: Recent Labs    03/10/21 2030 03/10/21 2236 03/11/21 0106 03/11/21 0305 03/11/21 0801  HGB 15.0  --   --   --   --   HCT 44.0  --   --   --   --   PLT 333  --   --   --   --   HEPARINUNFRC  --   --   --   --  0.11*  CREATININE 0.76  --   --   --   --   TROPONINIHS 12 62* 104* 174*  --      Estimated Creatinine Clearance: 60.2 mL/min (by C-G formula based on SCr of 0.76 mg/dL).   Medical History: Past Medical History:  Diagnosis Date   Hypertension    Hypothyroidism    Parkinson's disease ([place]) 07/24/2019   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(HCC)     Medications:  No current facility-administered medications on file prior to encounter.   Current Outpatient Medications on File Prior to Encounter  Medication Sig Dispense Refill   amLODipine (NORVASC) 2.5 MG tablet Take 2.5 mg by mouth daily.     carbidopa-levodopa (SINEMET IR) 25-100 MG tablet TAKE 1/2 TABLET BY MOUTH THREE TIMES DAILY FOR 4 WEEKS THEN TAKE 1 TABLET BY MOUTH THREE TIMES DAILY 270 tablet 3   ferrous sulfate 325 (65 FE) MG tablet Take 325 mg by mouth daily with breakfast.     meloxicam (MOBIC) 15 MG tablet Take 15 mg by mouth daily.     mesalamine (PENTASA) 500 MG CR capsule Take 1,000 mg by mouth 2 (two) times daily.      Multiple Vitamin (MULTIVITAMIN) tablet Take 1 tablet by mouth daily.     Omega-3 Fatty Acids (FISH OIL PO) Take 1 capsule by mouth daily.     progesterone (PROMETRIUM) 200 MG capsule Take 200 mg by mouth daily.     telmisartan (MICARDIS) 40 MG tablet Take 40 mg by mouth daily.     vitamin  E 100 UNIT capsule Take 100 Units by mouth daily.     aspirin 81 MG tablet Take 81 mg by mouth daily.     calcium carbonate 1250 MG capsule Take 1,250 mg by mouth 2 (two) times daily with a meal.     cholecalciferol (VITAMIN D) 1000 UNITS tablet Take 1,000 Units by mouth daily.     ibuprofen (ADVIL,MOTRIN) 600 MG tablet Take 1 tablet (600 mg total) by mouth every 6 (six) hours as needed. 30 tablet 0   NONFORMULARY OR COMPOUNDED ITEM Estrogen and Testosterone pellets       Assessment: 70 y.o. female with chest pain for heparin while ruling out ACS. Initial heparin level subtherapeutic at 0.11.   Goal of Therapy:  Heparin level 0.3-0.7 units/ml Monitor platelets by anticoagulation protocol: Yes   Plan:  Heparin 2000 units x1 then increase to 950 units/h Check heparin level in 6h  Arrie Senate, PharmD, [place], [facility] Clinical Pharmacist [phone] Please check AMION for all [place] numbers 03/11/2021

## 2021-03-11 NOTE — Progress Notes (Signed)
Paged [facility] and [facility] to inform of patient's arrival to the floor.    Donah Driver, RN

## 2021-03-11 NOTE — Consult Note (Signed)
Cardiology Consultation:   Patient ID: Ayeshia Coppin MRN: [id number]; DOB: [birth year]  Admit date: 03/10/2021 Date of Consult: 03/11/2021  Primary Care Provider: Lilian Coma., MD [facility] Cardiologist: None  [facility] Electrophysiologist:  None    Patient Profile:   Shamiyah Ngu is a 70 y.o. female with a hx of HTN, HLD and Parkinson's disease who is being seen today for the evaluation of chest pain at the request of Oren Binet, MD.  History of Present Illness:   Ms. Giebel is a 70 y.o. female with a hx of HTN, HLD and Parkinson's disease who was in her USOH until yesterday when she developed CP while at her hairdresser.  She was sitting and had sudden onset of heaviness like an elephant sitting on her chest with radiation in to the neck and left shoulder 7/10 in intensity lasting 2.5 hours associated with mild SOB and diaphoresis.  She was taken to [facility] and was given SL NTG x 2 with resolution of CP.  She was started on IV Heparin gtt.  Initial hsTrop was normal at 12 but then trending upward 62>104>174>210.  EKG showed NSR with anteroseptal infarct age undetermined and nonspecific ST/T wave abnormality.  Cardiology is now asked to consult.    She has remained pain free since yesterday.  She has never smoked but does have a fm hx of heart disease in both her parents with her father having CABG in his 87's.  She is normally very active and plays golf.     Past Medical History:  Diagnosis Date   Hypertension    Hypothyroidism    Parkinson's disease ([place]) 07/24/2019   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[facility])     Past Surgical History:  Procedure Laterality Date   APPENDECTOMY     AUGMENTATION MAMMAPLASTY     saline - 2015   CHOLECYSTECTOMY     ECTOPIC PREGNANCY SURGERY     KNEE SURGERY     TONSILLECTOMY       Home Medications:  Prior to Admission medications   Medication Sig Start Date End Date Taking? Authorizing Provider  amLODipine (NORVASC) 2.5 MG tablet Take 2.5 mg by  mouth daily.   Yes [provider]  carbidopa-levodopa (SINEMET IR) 25-100 MG tablet TAKE 1/2 TABLET BY MOUTH THREE TIMES DAILY FOR 4 WEEKS THEN TAKE 1 TABLET BY MOUTH THREE TIMES DAILY 02/27/20  Yes Kathrynn Ducking, MD  ferrous sulfate 325 (65 FE) MG tablet Take 325 mg by mouth daily with breakfast.   Yes [provider]  meloxicam (MOBIC) 15 MG tablet Take 15 mg by mouth daily.   Yes [provider]  mesalamine (PENTASA) 500 MG CR capsule Take 1,000 mg by mouth 2 (two) times daily.    Yes [provider]  Multiple Vitamin (MULTIVITAMIN) tablet Take 1 tablet by mouth daily.   Yes [provider]  Omega-3 Fatty Acids (FISH OIL PO) Take 1 capsule by mouth daily.   Yes [provider]  progesterone (PROMETRIUM) 200 MG capsule Take 200 mg by mouth daily.   Yes [provider]  telmisartan (MICARDIS) 40 MG tablet Take 40 mg by mouth daily.   Yes [provider]  vitamin E 100 UNIT capsule Take 100 Units by mouth daily.   Yes [provider]  aspirin 81 MG tablet Take 81 mg by mouth daily.    [provider]  calcium carbonate 1250 MG capsule Take 1,250 mg by mouth 2 (two) times daily with a  meal.    [provider]  cholecalciferol (VITAMIN D) 1000 UNITS tablet Take 1,000 Units by mouth daily.    [provider]  ibuprofen (ADVIL,MOTRIN) 600 MG tablet Take 1 tablet (600 mg total) by mouth every 6 (six) hours as needed. 11/12/14   Kirichenko, Lahoma Rocker, PA-C  NONFORMULARY OR COMPOUNDED ITEM Estrogen and Testosterone pellets    [provider]    Inpatient Medications: Scheduled Meds:  aspirin EC  325 mg Oral Daily   atorvastatin  40 mg Oral Daily   carbidopa-levodopa  1 tablet Oral TID   mesalamine  1,000 mg Oral BID   metoprolol tartrate  25 mg Oral BID   pantoprazole  40 mg Oral Q1200   sodium chloride flush  3 mL Intravenous Q12H   Continuous Infusions:  sodium chloride      heparin 950 Units/hr (03/11/21 0937)   PRN Meds: sodium chloride, acetaminophen **OR** acetaminophen, morphine injection, nitroGLYCERIN, ondansetron **OR** ondansetron (ZOFRAN) IV, sodium chloride flush  Allergies:    Allergies  Allergen Reactions   Zithromax [Azithromycin] Rash    Social History:   Social History   Socioeconomic History   Marital status: Married    Spouse name: Not on file   Number of children: 2   Years of education: HS   Highest education level: Not on file  Occupational History   Occupation: Accounting  Tobacco Use   Smoking status: Never   Smokeless tobacco: Never  Vaping Use   Vaping Use: Never used  Substance and Sexual Activity   Alcohol use: Yes    Alcohol/week: 0.0 standard drinks    Comment: One weekly - 2 glasses of wine   Drug use: No   Sexual activity: Yes    Birth control/protection: Post-menopausal    Comment: 1st intercourse 70 yo-Fewer than 5 partners  Other Topics Concern   Not on file  Social History Narrative   Lives at home with her husband.   Right-handed.   3 cups caffeine daily   Social Determinants of Health   Financial Resource Strain: Not on file  Food Insecurity: Not on file  Transportation Needs: Not on file  Physical Activity: Not on file  Stress: Not on file  Social Connections: Not on file  Intimate Partner Violence: Not on file    Family History:    Family History  Problem Relation Age of Onset   Hypertension Father    Heart disease Father    Hypertension Mother    Heart disease Mother      ROS:  Please see the history of present illness.   All other ROS reviewed and negative.     Physical Exam/Data:   Vitals:   03/11/21 0130 03/11/21 0321 03/11/21 0410 03/11/21 0430  BP: (!) 164/74 (!) 155/85  (!) 151/62  Pulse: 66 80  71  Resp: 16 16  16   Temp:    97.8 F (36.6 C)  TempSrc:    Oral  SpO2: 97% 100%  100%  Weight:   59.1 kg   Height:       No intake or output data in the 24 hours ending  03/11/21 1154 Last 3 Weights 03/11/2021 03/10/2021 09/17/2019  Weight (lbs) 130 lb 4.8 oz 134 lb 6.4 oz 136 lb  Weight (kg) 59.104 kg 60.963 kg 61.689 kg     Body mass index is 22.02 kg/m.  General:  Well nourished, well developed, in no acute distress HEENT: normal Lymph: no adenopathy Neck: no JVD Endocrine:  No thryomegaly Vascular: No carotid bruits; FA pulses 2+ bilaterally without bruits  Cardiac:  normal S1, S2; RRR; no murmur  Lungs:  clear to auscultation bilaterally, no wheezing, rhonchi or rales  Abd: soft, nontender, no hepatomegaly  Ext: no edema Musculoskeletal:  No deformities, BUE and BLE strength normal and equal Skin: warm and dry  Neuro:  CNs 2-12 intact, no focal abnormalities noted Psych:  Normal affect   EKG:  The EKG was personally reviewed and demonstrates:  NSR with anteroseptal infarct age undetermined and nonspecific ST/T wave abnormality Telemetry:  Telemetry was personally reviewed and demonstrates:  NSR  Relevant CV Studies: none  Laboratory Data:  High Sensitivity Troponin:   Recent Labs  Lab 03/10/21 2030 03/10/21 2236 03/11/21 0106 03/11/21 0305 03/11/21 0919  TROPONINIHS 12 62* 104* 174* 210*     Chemistry Recent Labs  Lab 03/10/21 2030  NA 137  K 3.9  CL 100  CO2 30  GLUCOSE 112*  BUN 20  CREATININE 0.76  CALCIUM 8.7*  GFRNONAA >60  ANIONGAP 7    Recent Labs  Lab 03/10/21 2236  PROT 4.3*  ALBUMIN 2.6*  AST 10*  ALT 5  ALKPHOS 46  BILITOT 0.2*   Hematology Recent Labs  Lab 03/10/21 2030  WBC 7.6  RBC 4.63  HGB 15.0  HCT 44.0  MCV 95.0  MCH 32.4  MCHC 34.1  RDW 14.4  PLT 333   BNPNo results for input(s): BNP, PROBNP in the last 168 hours.  DDimer No results for input(s): DDIMER in the last 168 hours.   Radiology/Studies:  DG Chest 2 View  Result Date: 03/10/2021 CLINICAL DATA:  Chest pain EXAM: CHEST - 2 VIEW COMPARISON:  04/11/2012 FINDINGS: Heart and mediastinal contours are within normal limits. No  focal opacities or effusions. No acute bony abnormality. IMPRESSION: No active cardiopulmonary disease. Electronically Signed   By: Rolm Baptise M.D.   On: 03/10/2021 21:06   CT Angio Chest/Abd/Pel for Dissection W and/or Wo Contrast  Result Date: 03/10/2021 CLINICAL DATA:  Chest pain, back pain, hypertension, dyspnea EXAM: CT ANGIOGRAPHY CHEST, ABDOMEN AND PELVIS TECHNIQUE: Non-contrast CT of the chest was initially obtained. Multidetector CT imaging through the chest, abdomen and pelvis was performed using the standard protocol during bolus administration of intravenous contrast. Multiplanar reconstructed images and MIPs were obtained and reviewed to evaluate the vascular anatomy. CONTRAST:  158mL OMNIPAQUE IOHEXOL 350 MG/ML SOLN COMPARISON:  None. FINDINGS: CTA CHEST FINDINGS Cardiovascular: The thoracic aorta is of normal caliber. No intramural hematoma, dissection, or aneurysm. Minimal atherosclerotic calcification within the descending thoracic aorta. Arch vasculature demonstrates normal anatomic configuration and is widely patent proximally. No significant coronary artery calcification. Global cardiac size within normal limits. No pericardial effusion. The central pulmonary arteries are of normal caliber. Mediastinum/Nodes: Multinodular thyroid with progressive enlargement of dominant right thyroid nodule now measuring 2.1 cm in greatest dimension. No pathologic thoracic adenopathy. Esophagus unremarkable. Lungs/Pleura: Lungs are clear. No pleural effusion or pneumothorax. Musculoskeletal: Bilateral breast implants are noted. No acute bone abnormality. Review of the MIP images confirms the above findings. CTA ABDOMEN AND PELVIS FINDINGS VASCULAR Aorta: Minimal atherosclerotic calcification. Normal caliber. No aneurysm or dissection. No periaortic inflammatory change. Celiac: Less than 50% stenosis of the a origin of the celiac axis. Distally widely patent. No aneurysm or dissection. SMA: Widely patent.   No aneurysm or dissection. Renals: Single renal arteries are widely patent bilaterally. Beaded appearance of the mid segment of the right renal arteries in keeping with  changes of fibromuscular dysplasia. No aneurysm. IMA: Widely patent Inflow: Widely patent.  No aneurysm or dissection. 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 There is narrowing of the left renal vein as it crosses the aorta, however, and this may, in part, represent collateralization of the left renal vein secondary to a central stenosis. Review of the MIP images confirms the above findings. NON-VASCULAR Hepatobiliary: No focal liver abnormality is seen. Status post cholecystectomy. No biliary dilatation. Pancreas: Unremarkable Spleen: Unremarkable Adrenals/Urinary Tract: The adrenal glands are unremarkable. The kidneys are normal in size and position. Simple exophytic cortical cyst arises from the upper pole of the left kidney. The kidneys are otherwise unremarkable. The bladder is unremarkable. Stomach/Bowel: The stomach, small bowel, and large bowel are unremarkable. No free intraperitoneal gas or fluid. Lymphatic: No pathologic adenopathy within the abdomen and pelvis. Reproductive: Aside from adnexal varices, the pelvic organs are unremarkable. Other: No abdominal wall hernia.  Rectum is unremarkable Musculoskeletal: No acute bone abnormality. No lytic or blastic bone lesion. Review of the MIP images confirms the above findings. IMPRESSION: No evidence of thoracoabdominal aortic aneurysm or dissection. Minimal atherosclerotic plaque. No acute intrathoracic or intra-abdominal pathology identified. Enlarging dominant right thyroid nodule. Recommend thyroid US (ref: J Am Coll Radiol. 2015 Feb;12(2): 143-50). Beaded appearance of the a right renal artery in keeping with changes of fibromuscular  dysplasia. Marked dilation of the ovarian veins bilaterally, with extensive bilateral adnexal varices. Narrowing of the left renal vein centrally. 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 This appears unchanged from prior examination. Aortic Atherosclerosis (ICD10-I70.0). Electronically Signed   By: Fidela Salisbury MD   On: 03/10/2021 23:53     Assessment and Plan:   NSTEMI -developed SSCP with radiation to her neck and left shoulder associated with diaphoresis and SOB at rest and lasting 2.5 hours resolving with SL NTG and ASA -EKG with nonspecific ST/T wave abnormality -rule in for MI with hsTrop 12>62>104>174>210. -currently pain free -2D echo pending -CRFs include fm hx of premature CAD in 50's, post menopausal state and HTN -recommend proceeding with left heart cath to define coronary anatomy -Shared Decision Making/Informed Consent The risks [stroke (1 in 1000), death (1 in 1000), kidney failure [usually temporary] (1 in 500), bleeding (1 in 200), allergic reaction [possibly serious] (1 in 200)], benefits (diagnostic support and management of coronary artery disease) and alternatives of a cardiac catheterization were discussed in detail with Ms. Lotts and she is willing to proceed.  -continue IV heparin gtt per pharmacy -continue Lopressor 25mg  BID, statin -decrease ASA to 81mg  daily -check FLP in am -check HbA1C  2.  HTN -BP controlled on exam today -continue Lopressor 25mg  BID     TIMI Risk Score for Unstable Angina or Non-ST Elevation MI:   The patient's TIMI risk score is 4, which indicates a 20% risk of all cause mortality, new or recurrent myocardial infarction or need for urgent revascularization in the next 14 days.{ [id number] For questions or updates, please contact [place] Please consult [email or web address] for  contact info under    Signed, Fransico Him, MD  03/11/2021 11:54 AM

## 2021-03-11 NOTE — ED Provider Notes (Signed)
Patient informed of thyroid nodule and need for follow-up.  She agrees with plan   Ripley Fraise, MD 03/11/21 [phone]

## 2021-03-11 NOTE — H&P (View-Only) (Signed)
Cardiology Consultation:   Patient ID: Hetty Linhart MRN: [id number]; DOB: December 13, 1950  Admit date: 03/10/2021 Date of Consult: 03/11/2021  Primary Care Provider: Lilian Coma., MD [facility] Cardiologist: None  [facility] Electrophysiologist:  None    Patient Profile:   Nancyjo Givhan is a 70 y.o. female with a hx of HTN, HLD and Parkinson's disease who is being seen today for the evaluation of chest pain at the request of Oren Binet, MD.  History of Present Illness:   Ms. Vanhandel is a 70 y.o. female with a hx of HTN, HLD and Parkinson's disease who was in her USOH until yesterday when she developed CP while at her hairdresser.  She was sitting and had sudden onset of heaviness like an elephant sitting on her chest with radiation in to the neck and left shoulder 7/10 in intensity lasting 2.5 hours associated with mild SOB and diaphoresis.  She was taken to [facility] and was given SL NTG x 2 with resolution of CP.  She was started on IV Heparin gtt.  Initial hsTrop was normal at 12 but then trending upward 62>104>174>210.  EKG showed NSR with anteroseptal infarct age undetermined and nonspecific ST/T wave abnormality.  Cardiology is now asked to consult.    She has remained pain free since yesterday.  She has never smoked but does have a fm hx of heart disease in both her parents with her father having CABG in his 59's.  She is normally very active and plays golf.     Past Medical History:  Diagnosis Date   Hypertension    Hypothyroidism    Parkinson's disease ([place]) 07/24/2019   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[facility])     Past Surgical History:  Procedure Laterality Date   APPENDECTOMY     AUGMENTATION MAMMAPLASTY     saline - 2015   CHOLECYSTECTOMY     ECTOPIC PREGNANCY SURGERY     KNEE SURGERY     TONSILLECTOMY       Home Medications:  Prior to Admission medications   Medication Sig Start Date End Date Taking? Authorizing Provider  amLODipine (NORVASC) 2.5 MG tablet Take 2.5 mg by  mouth daily.   Yes [provider]  carbidopa-levodopa (SINEMET IR) 25-100 MG tablet TAKE 1/2 TABLET BY MOUTH THREE TIMES DAILY FOR 4 WEEKS THEN TAKE 1 TABLET BY MOUTH THREE TIMES DAILY 02/27/20  Yes Kathrynn Ducking, MD  ferrous sulfate 325 (65 FE) MG tablet Take 325 mg by mouth daily with breakfast.   Yes [provider]  meloxicam (MOBIC) 15 MG tablet Take 15 mg by mouth daily.   Yes [provider]  mesalamine (PENTASA) 500 MG CR capsule Take 1,000 mg by mouth 2 (two) times daily.    Yes [provider]  Multiple Vitamin (MULTIVITAMIN) tablet Take 1 tablet by mouth daily.   Yes [provider]  Omega-3 Fatty Acids (FISH OIL PO) Take 1 capsule by mouth daily.   Yes [provider]  progesterone (PROMETRIUM) 200 MG capsule Take 200 mg by mouth daily.   Yes [provider]  telmisartan (MICARDIS) 40 MG tablet Take 40 mg by mouth daily.   Yes [provider]  vitamin E 100 UNIT capsule Take 100 Units by mouth daily.   Yes [provider]  aspirin 81 MG tablet Take 81 mg by mouth daily.    [provider]  calcium carbonate 1250 MG capsule Take 1,250 mg by mouth 2 (two) times daily with a  meal.    [provider]  cholecalciferol (VITAMIN D) 1000 UNITS tablet Take 1,000 Units by mouth daily.    [provider]  ibuprofen (ADVIL,MOTRIN) 600 MG tablet Take 1 tablet (600 mg total) by mouth every 6 (six) hours as needed. 11/12/14   Kirichenko, Lahoma Rocker, PA-C  NONFORMULARY OR COMPOUNDED ITEM Estrogen and Testosterone pellets    [provider]    Inpatient Medications: Scheduled Meds:  aspirin EC  325 mg Oral Daily   atorvastatin  40 mg Oral Daily   carbidopa-levodopa  1 tablet Oral TID   mesalamine  1,000 mg Oral BID   metoprolol tartrate  25 mg Oral BID   pantoprazole  40 mg Oral Q1200   sodium chloride flush  3 mL Intravenous Q12H   Continuous Infusions:  sodium chloride      heparin 950 Units/hr (03/11/21 0937)   PRN Meds: sodium chloride, acetaminophen **OR** acetaminophen, morphine injection, nitroGLYCERIN, ondansetron **OR** ondansetron (ZOFRAN) IV, sodium chloride flush  Allergies:    Allergies  Allergen Reactions   Zithromax [Azithromycin] Rash    Social History:   Social History   Socioeconomic History   Marital status: Married    Spouse name: Not on file   Number of children: 2   Years of education: HS   Highest education level: Not on file  Occupational History   Occupation: Accounting  Tobacco Use   Smoking status: Never   Smokeless tobacco: Never  Vaping Use   Vaping Use: Never used  Substance and Sexual Activity   Alcohol use: Yes    Alcohol/week: 0.0 standard drinks    Comment: One weekly - 2 glasses of wine   Drug use: No   Sexual activity: Yes    Birth control/protection: Post-menopausal    Comment: 1st intercourse 70 yo-Fewer than 5 partners  Other Topics Concern   Not on file  Social History Narrative   Lives at home with her husband.   Right-handed.   3 cups caffeine daily   Social Determinants of Health   Financial Resource Strain: Not on file  Food Insecurity: Not on file  Transportation Needs: Not on file  Physical Activity: Not on file  Stress: Not on file  Social Connections: Not on file  Intimate Partner Violence: Not on file    Family History:    Family History  Problem Relation Age of Onset   Hypertension Father    Heart disease Father    Hypertension Mother    Heart disease Mother      ROS:  Please see the history of present illness.   All other ROS reviewed and negative.     Physical Exam/Data:   Vitals:   03/11/21 0130 03/11/21 0321 03/11/21 0410 03/11/21 0430  BP: (!) 164/74 (!) 155/85  (!) 151/62  Pulse: 66 80  71  Resp: 16 16  16   Temp:    97.8 F (36.6 C)  TempSrc:    Oral  SpO2: 97% 100%  100%  Weight:   59.1 kg   Height:       No intake or output data in the 24 hours ending  03/11/21 1154 Last 3 Weights 03/11/2021 03/10/2021 09/17/2019  Weight (lbs) 130 lb 4.8 oz 134 lb 6.4 oz 136 lb  Weight (kg) 59.104 kg 60.963 kg 61.689 kg     Body mass index is 22.02 kg/m.  General:  Well nourished, well developed, in no acute distress HEENT: normal Lymph: no adenopathy Neck: no JVD Endocrine:  No thryomegaly Vascular: No carotid bruits; FA pulses 2+ bilaterally without bruits  Cardiac:  normal S1, S2; RRR; no murmur  Lungs:  clear to auscultation bilaterally, no wheezing, rhonchi or rales  Abd: soft, nontender, no hepatomegaly  Ext: no edema Musculoskeletal:  No deformities, BUE and BLE strength normal and equal Skin: warm and dry  Neuro:  CNs 2-12 intact, no focal abnormalities noted Psych:  Normal affect   EKG:  The EKG was personally reviewed and demonstrates:  NSR with anteroseptal infarct age undetermined and nonspecific ST/T wave abnormality Telemetry:  Telemetry was personally reviewed and demonstrates:  NSR  Relevant CV Studies: none  Laboratory Data:  High Sensitivity Troponin:   Recent Labs  Lab 03/10/21 2030 03/10/21 2236 03/11/21 0106 03/11/21 0305 03/11/21 0919  TROPONINIHS 12 62* 104* 174* 210*     Chemistry Recent Labs  Lab 03/10/21 2030  NA 137  K 3.9  CL 100  CO2 30  GLUCOSE 112*  BUN 20  CREATININE 0.76  CALCIUM 8.7*  GFRNONAA >60  ANIONGAP 7    Recent Labs  Lab 03/10/21 2236  PROT 4.3*  ALBUMIN 2.6*  AST 10*  ALT 5  ALKPHOS 46  BILITOT 0.2*   Hematology Recent Labs  Lab 03/10/21 2030  WBC 7.6  RBC 4.63  HGB 15.0  HCT 44.0  MCV 95.0  MCH 32.4  MCHC 34.1  RDW 14.4  PLT 333   BNPNo results for input(s): BNP, PROBNP in the last 168 hours.  DDimer No results for input(s): DDIMER in the last 168 hours.   Radiology/Studies:  DG Chest 2 View  Result Date: 03/10/2021 CLINICAL DATA:  Chest pain EXAM: CHEST - 2 VIEW COMPARISON:  04/11/2012 FINDINGS: Heart and mediastinal contours are within normal limits. No  focal opacities or effusions. No acute bony abnormality. IMPRESSION: No active cardiopulmonary disease. Electronically Signed   By: Rolm Baptise M.D.   On: 03/10/2021 21:06   CT Angio Chest/Abd/Pel for Dissection W and/or Wo Contrast  Result Date: 03/10/2021 CLINICAL DATA:  Chest pain, back pain, hypertension, dyspnea EXAM: CT ANGIOGRAPHY CHEST, ABDOMEN AND PELVIS TECHNIQUE: Non-contrast CT of the chest was initially obtained. Multidetector CT imaging through the chest, abdomen and pelvis was performed using the standard protocol during bolus administration of intravenous contrast. Multiplanar reconstructed images and MIPs were obtained and reviewed to evaluate the vascular anatomy. CONTRAST:  139mL OMNIPAQUE IOHEXOL 350 MG/ML SOLN COMPARISON:  None. FINDINGS: CTA CHEST FINDINGS Cardiovascular: The thoracic aorta is of normal caliber. No intramural hematoma, dissection, or aneurysm. Minimal atherosclerotic calcification within the descending thoracic aorta. Arch vasculature demonstrates normal anatomic configuration and is widely patent proximally. No significant coronary artery calcification. Global cardiac size within normal limits. No pericardial effusion. The central pulmonary arteries are of normal caliber. Mediastinum/Nodes: Multinodular thyroid with progressive enlargement of dominant right thyroid nodule now measuring 2.1 cm in greatest dimension. No pathologic thoracic adenopathy. Esophagus unremarkable. Lungs/Pleura: Lungs are clear. No pleural effusion or pneumothorax. Musculoskeletal: Bilateral breast implants are noted. No acute bone abnormality. Review of the MIP images confirms the above findings. CTA ABDOMEN AND PELVIS FINDINGS VASCULAR Aorta: Minimal atherosclerotic calcification. Normal caliber. No aneurysm or dissection. No periaortic inflammatory change. Celiac: Less than 50% stenosis of the a origin of the celiac axis. Distally widely patent. No aneurysm or dissection. SMA: Widely patent.   No aneurysm or dissection. Renals: Single renal arteries are widely patent bilaterally. Beaded appearance of the mid segment of the right renal arteries in keeping with  changes of fibromuscular dysplasia. No aneurysm. IMA: Widely patent Inflow: Widely patent.  No aneurysm or dissection. 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 There is narrowing of the left renal vein as it crosses the aorta, however, and this may, in part, represent collateralization of the left renal vein secondary to a central stenosis. Review of the MIP images confirms the above findings. NON-VASCULAR Hepatobiliary: No focal liver abnormality is seen. Status post cholecystectomy. No biliary dilatation. Pancreas: Unremarkable Spleen: Unremarkable Adrenals/Urinary Tract: The adrenal glands are unremarkable. The kidneys are normal in size and position. Simple exophytic cortical cyst arises from the upper pole of the left kidney. The kidneys are otherwise unremarkable. The bladder is unremarkable. Stomach/Bowel: The stomach, small bowel, and large bowel are unremarkable. No free intraperitoneal gas or fluid. Lymphatic: No pathologic adenopathy within the abdomen and pelvis. Reproductive: Aside from adnexal varices, the pelvic organs are unremarkable. Other: No abdominal wall hernia.  Rectum is unremarkable Musculoskeletal: No acute bone abnormality. No lytic or blastic bone lesion. Review of the MIP images confirms the above findings. IMPRESSION: No evidence of thoracoabdominal aortic aneurysm or dissection. Minimal atherosclerotic plaque. No acute intrathoracic or intra-abdominal pathology identified. Enlarging dominant right thyroid nodule. Recommend thyroid US (ref: J Am Coll Radiol. 2015 Feb;12(2): 143-50). Beaded appearance of the a right renal artery in keeping with changes of fibromuscular  dysplasia. Marked dilation of the ovarian veins bilaterally, with extensive bilateral adnexal varices. Narrowing of the left renal vein centrally. 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 This appears unchanged from prior examination. Aortic Atherosclerosis (ICD10-I70.0). Electronically Signed   By: Fidela Salisbury MD   On: 03/10/2021 23:53     Assessment and Plan:   NSTEMI -developed SSCP with radiation to her neck and left shoulder associated with diaphoresis and SOB at rest and lasting 2.5 hours resolving with SL NTG and ASA -EKG with nonspecific ST/T wave abnormality -rule in for MI with hsTrop 12>62>104>174>210. -currently pain free -2D echo pending -CRFs include fm hx of premature CAD in 50's, post menopausal state and HTN -recommend proceeding with left heart cath to define coronary anatomy -Shared Decision Making/Informed Consent The risks [stroke (1 in 1000), death (1 in 1000), kidney failure [usually temporary] (1 in 500), bleeding (1 in 200), allergic reaction [possibly serious] (1 in 200)], benefits (diagnostic support and management of coronary artery disease) and alternatives of a cardiac catheterization were discussed in detail with Ms. Partin and she is willing to proceed.  -continue IV heparin gtt per pharmacy -continue Lopressor 25mg  BID, statin -decrease ASA to 81mg  daily -check FLP in am -check HbA1C  2.  HTN -BP controlled on exam today -continue Lopressor 25mg  BID     TIMI Risk Score for Unstable Angina or Non-ST Elevation MI:   The patient's TIMI risk score is 4, which indicates a 20% risk of all cause mortality, new or recurrent myocardial infarction or need for urgent revascularization in the next 14 days.{ [id number] For questions or updates, please contact [place] Please consult [email or web address] for  contact info under    Signed, Fransico Him, MD  03/11/2021 11:54 AM

## 2021-03-11 NOTE — Interval H&P Note (Signed)
Cath Lab Visit (complete for each Cath Lab visit)  Clinical Evaluation Leading to the Procedure:   ACS: No.  Non-ACS:    Anginal Classification: CCS IV  Anti-ischemic medical therapy: Minimal Therapy (1 class of medications)  Non-Invasive Test Results: No non-invasive testing performed  Prior CABG: No previous CABG      History and Physical Interval Note:  03/11/2021 4:27 PM  Melinda Lane  has presented today for surgery, with the diagnosis of chest pain.  The various methods of treatment have been discussed with the patient and family. After consideration of risks, benefits and other options for treatment, the patient has consented to  Procedure(s): LEFT HEART CATH AND CORONARY ANGIOGRAPHY (N/A) as a surgical intervention.  The patient's history has been reviewed, patient examined, no change in status, stable for surgery.  I have reviewed the patient's chart and labs.  Questions were answered to the patient's satisfaction.     Shelva Majestic

## 2021-03-11 NOTE — ED Provider Notes (Signed)
D/w Dr. Cyd Silence at [facility] Will admit to tele Pt is CP free at this time Will need f/u on thyroid nodule    Ripley Fraise, MD 03/11/21 [phone]

## 2021-03-11 NOTE — Progress Notes (Signed)
ANTICOAGULATION CONSULT NOTE - Initial Consult  Pharmacy Consult for Heparin Indication: chest pain/ACS  Allergies  Allergen Reactions   Zithromax [Azithromycin] Rash    Patient Measurements: Height: 5' 4.5" (163.8 cm) Weight: 61 kg (134 lb 6.4 oz) IBW/kg (Calculated) : 55.85  Vital Signs: Temp: 98.5 F (36.9 C) (06/28 2018) Temp Source: Oral (06/28 2018) BP: 160/81 (06/28 2340) Pulse Rate: 83 (06/28 2340)  Labs: Recent Labs    03/10/21 2030 03/10/21 2236  HGB 15.0  --   HCT 44.0  --   PLT 333  --   CREATININE 0.76  --   TROPONINIHS 12 62*    Estimated Creatinine Clearance: 60.2 mL/min (by C-G formula based on SCr of 0.76 mg/dL).   Medical History: Past Medical History:  Diagnosis Date   Hypertension    Hypothyroidism    Parkinson's disease ([place]) 07/24/2019   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([facility])     Medications:  No current facility-administered medications on file prior to encounter.   Current Outpatient Medications on File Prior to Encounter  Medication Sig Dispense Refill   amLODipine (NORVASC) 2.5 MG tablet Take 2.5 mg by mouth daily.     carbidopa-levodopa (SINEMET IR) 25-100 MG tablet TAKE 1/2 TABLET BY MOUTH THREE TIMES DAILY FOR 4 WEEKS THEN TAKE 1 TABLET BY MOUTH THREE TIMES DAILY 270 tablet 3   ferrous sulfate 325 (65 FE) MG tablet Take 325 mg by mouth daily with breakfast.     meloxicam (MOBIC) 15 MG tablet Take 15 mg by mouth daily.     mesalamine (PENTASA) 500 MG CR capsule Take 1,000 mg by mouth 2 (two) times daily.      Multiple Vitamin (MULTIVITAMIN) tablet Take 1 tablet by mouth daily.     Omega-3 Fatty Acids (FISH OIL PO) Take 1 capsule by mouth daily.     progesterone (PROMETRIUM) 200 MG capsule Take 200 mg by mouth daily.     telmisartan (MICARDIS) 40 MG tablet Take 40 mg by mouth daily.     vitamin E 100 UNIT capsule Take 100 Units by mouth daily.     aspirin 81 MG tablet Take 81 mg by mouth daily.     calcium carbonate 1250 MG capsule Take  1,250 mg by mouth 2 (two) times daily with a meal.     cholecalciferol (VITAMIN D) 1000 UNITS tablet Take 1,000 Units by mouth daily.     ibuprofen (ADVIL,MOTRIN) 600 MG tablet Take 1 tablet (600 mg total) by mouth every 6 (six) hours as needed. 30 tablet 0   NONFORMULARY OR COMPOUNDED ITEM Estrogen and Testosterone pellets       Assessment: 70 y.o. female with chest pain for heparin  Goal of Therapy:  Heparin level 0.3-0.7 units/ml Monitor platelets by anticoagulation protocol: Yes   Plan:  Heparin 3000 units IV bolus, then start heparin 750 units/hr Check heparin level in 6 hours.   Caryl Pina 03/11/2021,12:24 AM

## 2021-03-11 NOTE — ED Notes (Signed)
Pt ambulatory to restroom

## 2021-03-11 NOTE — Progress Notes (Signed)
Echo attempted. Patient being taken to cath lab. Will attempt again later as time permits.

## 2021-03-12 ENCOUNTER — Observation Stay (HOSPITAL_BASED_OUTPATIENT_CLINIC_OR_DEPARTMENT_OTHER): Payer: Medicare Other

## 2021-03-12 ENCOUNTER — Other Ambulatory Visit (HOSPITAL_COMMUNITY): Payer: Self-pay

## 2021-03-12 ENCOUNTER — Encounter (HOSPITAL_COMMUNITY): Payer: Self-pay | Admitting: Cardiovascular Disease

## 2021-03-12 ENCOUNTER — Encounter: Payer: Self-pay | Admitting: Physician Assistant

## 2021-03-12 DIAGNOSIS — R0602 Shortness of breath: Secondary | ICD-10-CM | POA: Diagnosis not present

## 2021-03-12 DIAGNOSIS — I201 Angina pectoris with documented spasm: Secondary | ICD-10-CM | POA: Diagnosis not present

## 2021-03-12 DIAGNOSIS — I10 Essential (primary) hypertension: Secondary | ICD-10-CM | POA: Diagnosis not present

## 2021-03-12 DIAGNOSIS — R079 Chest pain, unspecified: Secondary | ICD-10-CM | POA: Diagnosis not present

## 2021-03-12 DIAGNOSIS — R778 Other specified abnormalities of plasma proteins: Secondary | ICD-10-CM | POA: Diagnosis not present

## 2021-03-12 DIAGNOSIS — K589 Irritable bowel syndrome without diarrhea: Secondary | ICD-10-CM | POA: Insufficient documentation

## 2021-03-12 DIAGNOSIS — E7800 Pure hypercholesterolemia, unspecified: Secondary | ICD-10-CM

## 2021-03-12 LAB — CBC
HCT: 40.4 % (ref 36.0–46.0)
Hemoglobin: 13.8 g/dL (ref 12.0–15.0)
MCH: 32.5 pg (ref 26.0–34.0)
MCHC: 34.2 g/dL (ref 30.0–36.0)
MCV: 95.1 fL (ref 80.0–100.0)
Platelets: 302 10*3/uL (ref 150–400)
RBC: 4.25 MIL/uL (ref 3.87–5.11)
RDW: 14.5 % (ref 11.5–15.5)
WBC: 6.2 10*3/uL (ref 4.0–10.5)
nRBC: 0 % (ref 0.0–0.2)

## 2021-03-12 LAB — ECHOCARDIOGRAM COMPLETE
AR max vel: 1.78 cm2
AV Area VTI: 1.68 cm2
AV Area mean vel: 1.63 cm2
AV Mean grad: 5 mmHg
AV Peak grad: 9.7 mmHg
Ao pk vel: 1.56 m/s
Area-P 1/2: 3.83 cm2
Height: 64.5 in
P 1/2 time: 700 msec
S' Lateral: 2.7 cm
Weight: 2084.8 oz

## 2021-03-12 LAB — COMPREHENSIVE METABOLIC PANEL
ALT: 5 U/L (ref 0–44)
AST: 14 U/L — ABNORMAL LOW (ref 15–41)
Albumin: 3.1 g/dL — ABNORMAL LOW (ref 3.5–5.0)
Alkaline Phosphatase: 62 U/L (ref 38–126)
Anion gap: 7 (ref 5–15)
BUN: 11 mg/dL (ref 8–23)
CO2: 24 mmol/L (ref 22–32)
Calcium: 8.4 mg/dL — ABNORMAL LOW (ref 8.9–10.3)
Chloride: 105 mmol/L (ref 98–111)
Creatinine, Ser: 0.72 mg/dL (ref 0.44–1.00)
GFR, Estimated: >60 mL/min (ref >60)
Glucose, Bld: 82 mg/dL (ref 70–99)
Potassium: 3.6 mmol/L (ref 3.5–5.1)
Sodium: 136 mmol/L (ref 135–145)
Total Bilirubin: 0.6 mg/dL (ref 0.3–1.2)
Total Protein: 5.3 g/dL — ABNORMAL LOW (ref 6.5–8.1)

## 2021-03-12 LAB — TSH: TSH: 0.34 u[IU]/mL — ABNORMAL LOW (ref 0.350–4.500)

## 2021-03-12 MED ORDER — ASPIRIN 81 MG PO CHEW
81.0000 mg | CHEWABLE_TABLET | Freq: Every day | ORAL | 0 refills | Status: AC
  Filled 2021-03-12: qty 30, 30d supply, fill #0

## 2021-03-12 MED ORDER — AMLODIPINE BESYLATE 5 MG PO TABS
5.0000 mg | ORAL_TABLET | Freq: Every day | ORAL | 0 refills | Status: DC
  Filled 2021-03-12: qty 30, 30d supply, fill #0

## 2021-03-12 MED ORDER — METOPROLOL TARTRATE 25 MG PO TABS
25.0000 mg | ORAL_TABLET | Freq: Two times a day (BID) | ORAL | 0 refills | Status: DC
  Filled 2021-03-12: qty 60, 30d supply, fill #0

## 2021-03-12 MED ORDER — ATORVASTATIN CALCIUM 40 MG PO TABS
40.0000 mg | ORAL_TABLET | Freq: Every day | ORAL | 0 refills | Status: DC
  Filled 2021-03-12: qty 30, 30d supply, fill #0

## 2021-03-12 NOTE — Discharge Summary (Signed)
Physician Discharge Summary  Melinda Lane OZD:[id number] DOB: 1950-10-25 DOA: 03/10/2021  PCP: Lilian Coma., MD  Admit date: 03/10/2021 Discharge date: 03/12/2021  Admitted From: Home Disposition:  Home  Recommendations for Outpatient Follow-up:  Follow up with PCP in 1-2 weeks Follow up with Cardiology as scheduled  Discharge Condition:Stable CODE STATUS:Full Diet recommendation: Heart healthy   Brief/Interim Summary: 70 y.o. female with medical history significant of HTN, HLD, Crohn's disease, Parkinson's disease who presented with chest pain.  Per patient-she was at her hairdresser's yesterday-and had sudden onset of retrosternal chest pain (at rest/while sitting) that she describes as heaviness/elephant sitting on her chest.  This radiated to her neck and towards her left shoulder.  She rates the pain around 7/10 in intensity-lasted close to 2.5 hours.  She had some mild shortness of breath and diaphoresis associated with the pain.  There was no associated nausea or vomiting.  Her husband took her to [place] she was given 2 doses of sublingual nitroglycerin with significant improvement in her chest pain.  ED MD-spoke with cardiology on-call-they were advised to start IV heparin and admit the patient to the hospitalist service.  Discharge Diagnoses:  Active Problems:   Chest pain   Elevated troponin  NSTEMI/Chest pain:  -With typical features-initial troponin negative-but subsequent troponins are mildly elevated.  -Underwent cath on 6/29, found to have mild non-occlusive disease -Recommendation for maximization of medical tx -norvasc dose was changed to 5mg  qday to cover possible vasospasm   HTN:  -BP regimen was titrated as per below   Parkinson's disease:  -Continue Sinemet   Crohn's disease:  -Appears stable-continue mesalamine   Enlarging dominant right thyroid nodule:  -Seen incidentally on CT imaging-needs outpatient work-up.  Patient aware of  findings per ED note.   Discharge Instructions   Allergies as of 03/12/2021       Reactions   Zithromax [azithromycin] Rash        Medication List     STOP taking these medications    meloxicam 15 MG tablet Commonly known as: MOBIC   spironolactone 25 MG tablet Commonly known as: ALDACTONE   telmisartan 40 MG tablet Commonly known as: MICARDIS       TAKE these medications    amLODipine 5 MG tablet Commonly known as: NORVASC Take 1 tablet (5 mg total) by mouth daily. Start taking on: March 13, 2021 What changed: when to take this   aspirin 81 MG chewable tablet Chew 1 tablet (81 mg total) by mouth daily. Start taking on: March 13, 2021   atorvastatin 40 MG tablet Commonly known as: LIPITOR Take 1 tablet (40 mg total) by mouth daily. Start taking on: March 13, 2021   carbidopa-levodopa 25-100 MG tablet Commonly known as: SINEMET IR TAKE 1/2 TABLET BY MOUTH THREE TIMES DAILY FOR 4 WEEKS THEN TAKE 1 TABLET BY MOUTH THREE TIMES DAILY What changed:  how much to take how to take this when to take this additional instructions   cholecalciferol 1000 units tablet Commonly known as: VITAMIN D Take 1,000 Units by mouth daily.   dicyclomine 10 MG capsule Commonly known as: BENTYL Take 10 mg by mouth 3 (three) times daily as needed for cramping.   ferrous sulfate 325 (65 FE) MG tablet Take 325 mg by mouth daily with breakfast.   Fish Oil 1000 MG Caps Take 1,000 mg by mouth daily.   mesalamine 500 MG CR capsule Commonly known as: PENTASA Take 1,000 mg by mouth 2 (two)  times daily.   metoprolol tartrate 25 MG tablet Commonly known as: LOPRESSOR Take 1 tablet (25 mg total) by mouth 2 (two) times daily.   multivitamin tablet Take 1 tablet by mouth daily.   NONFORMULARY OR COMPOUNDED ITEM Apply 4-6 tablets topically every 4 (four) months. Estrogen and Testosterone pellets put under the skin   NP Thyroid 90 MG tablet Generic drug: thyroid Take 90 mg by mouth  every morning.   primidone 50 MG tablet Commonly known as: MYSOLINE Take 100 mg by mouth 3 (three) times daily with meals.   progesterone 200 MG capsule Commonly known as: PROMETRIUM Take 300 mg by mouth daily. Compound Rx        Follow-up Information     Revankar, Reita Cliche, MD Follow up on 03/31/2021.   Specialty: Cardiology Contact information: [address] [place]  [place] [address] [phone]         Lilian Coma., MD Follow up in 1 week(s).   Specialty: Internal Medicine Why: Hospital follow up Contact information: [address] [place] [address] [phone]         Sueanne Margarita, MD .   Specialty: Cardiology Contact information: [address] [place] [address] [phone]                Allergies  Allergen Reactions   Zithromax [Azithromycin] Rash    Consultations: Lorelee Cover to go home  Procedures/Studies: DG Chest 2 View  Result Date: 03/10/2021 CLINICAL DATA:  Chest pain EXAM: CHEST - 2 VIEW COMPARISON:  04/11/2012 FINDINGS: Heart and mediastinal contours are within normal limits. No focal opacities or effusions. No acute bony abnormality. IMPRESSION: No active cardiopulmonary disease. Electronically Signed   By: Rolm Baptise M.D.   On: 03/10/2021 21:06   CARDIAC CATHETERIZATION  Result Date: 03/11/2021  Ost LAD to Prox LAD lesion is 20% stenosed.  1st Mrg lesion is 15% stenosed.  Prox RCA lesion is 10% stenosed.  The left ventricular systolic function is normal.  LV end diastolic pressure is normal.  No significant coronary obstructive disease with mild smooth eccentric proximal LAD, ostial OM1, and proximal RCA narrowings. Normal LV function without focal segmental wall motion abnormalities.  LVEDP 11 mmHg. RECOMMENDATION: We will initiate aspirin therapy.  Consider possible transient coronary vasospasm in the etiology of her chest discomfort.  Medical therapy for optimal blood  pressure control and with her recent blood pressure elevation will increase amlodipine to 5 mg daily.  Aggressive lipid-lowering therapy with target LDL less than 70.    ECHOCARDIOGRAM COMPLETE  Result Date: 03/12/2021    ECHOCARDIOGRAM REPORT   Patient Name:   Melinda Lane Date of Exam: 03/12/2021 Medical Rec #:  [id number]  Height:       64.5 in Accession #:    [id number] Weight:       130.3 lb Date of Birth:  04/24/1951   BSA:          1.639 m Patient Age:    26 years   BP:           141/71 mmHg Patient Gender: F          HR:           57 bpm. Exam Location:  Inpatient Procedure: 2D Echo, Cardiac Doppler and Color Doppler Indications:    Chest pain  History:        Patient has prior history of Echocardiogram examinations, most  recent 04/12/2012. Risk Factors:Hypertension and Dyslipidemia.                 Parkinson's disease.  Sonographer:    Clayton Lefort RDCS (AE) Referring Phys: [place] Comments: Image acquisition challenging due to breast implants. IMPRESSIONS  1. Left ventricular ejection fraction, by estimation, is 55 to 60%. The left ventricle has normal function. The left ventricle has no regional wall motion abnormalities. Left ventricular diastolic parameters were normal.  2. Right ventricular systolic function is normal. The right ventricular size is normal. Tricuspid regurgitation signal is inadequate for assessing PA pressure.  3. The mitral valve is degenerative. No evidence of mitral valve regurgitation. No evidence of mitral stenosis.  4. The aortic valve is tricuspid. Aortic valve regurgitation is moderate. No aortic stenosis is present.  5. The inferior vena cava is normal in size with greater than 50% respiratory variability, suggesting right atrial pressure of 3 mmHg. FINDINGS  Left Ventricle: Left ventricular ejection fraction, by estimation, is 55 to 60%. The left ventricle has normal function. The left ventricle has no regional wall motion  abnormalities. The left ventricular internal cavity size was normal in size. There is  no left ventricular hypertrophy. Left ventricular diastolic parameters were normal. Right Ventricle: The right ventricular size is normal. No increase in right ventricular wall thickness. Right ventricular systolic function is normal. Tricuspid regurgitation signal is inadequate for assessing PA pressure. Left Atrium: Left atrial size was normal in size. Right Atrium: Right atrial size was normal in size. Pericardium: Trivial pericardial effusion is present. Mitral Valve: The mitral valve is degenerative in appearance. Mild mitral annular calcification. No evidence of mitral valve regurgitation. No evidence of mitral valve stenosis. Tricuspid Valve: The tricuspid valve is grossly normal. Tricuspid valve regurgitation is not demonstrated. No evidence of tricuspid stenosis. Aortic Valve: The aortic valve is tricuspid. Aortic valve regurgitation is moderate. Aortic regurgitation PHT measures 700 msec. No aortic stenosis is present. Aortic valve mean gradient measures 5.0 mmHg. Aortic valve peak gradient measures 9.7 mmHg. Aortic valve area, by VTI measures 1.68 cm. Pulmonic Valve: The pulmonic valve was grossly normal. Pulmonic valve regurgitation is not visualized. No evidence of pulmonic stenosis. Aorta: The aortic root is normal in size and structure. Venous: The inferior vena cava is normal in size with greater than 50% respiratory variability, suggesting right atrial pressure of 3 mmHg. IAS/Shunts: The atrial septum is grossly normal.  LEFT VENTRICLE PLAX 2D LVIDd:         4.60 cm  Diastology LVIDs:         2.70 cm  LV e' medial:    7.18 cm/s LV PW:         1.00 cm  LV E/e' medial:  10.7 LV IVS:        0.70 cm  LV e' lateral:   8.70 cm/s LVOT diam:     1.80 cm  LV E/e' lateral: 8.9 LV SV:         61 LV SV Index:   37 LVOT Area:     2.54 cm  RIGHT VENTRICLE             IVC RV Basal diam:  3.30 cm     IVC diam: 1.70 cm RV S  prime:     18.30 cm/s TAPSE (M-mode): 3.5 cm LEFT ATRIUM             Index       RIGHT ATRIUM  Index LA diam:        3.80 cm 2.32 cm/m  RA Area:     17.00 cm LA Vol (A2C):   40.6 ml 24.77 ml/m RA Volume:   47.30 ml  28.85 ml/m LA Vol (A4C):   40.5 ml 24.70 ml/m LA Biplane Vol: 42.2 ml 25.74 ml/m  AORTIC VALVE AV Area (Vmax):    1.78 cm AV Area (Vmean):   1.63 cm AV Area (VTI):     1.68 cm AV Vmax:           156.00 cm/s AV Vmean:          107.000 cm/s AV VTI:            0.361 m AV Peak Grad:      9.7 mmHg AV Mean Grad:      5.0 mmHg LVOT Vmax:         109.00 cm/s LVOT Vmean:        68.700 cm/s LVOT VTI:          0.238 m LVOT/AV VTI ratio: 0.66 AI PHT:            700 msec  AORTA Ao Root diam: 3.10 cm Ao Asc diam:  3.50 cm MITRAL VALVE MV Area (PHT): 3.83 cm    SHUNTS MV Decel Time: 198 msec    Systemic VTI:  0.24 m MV E velocity: 77.00 cm/s  Systemic Diam: 1.80 cm MV A velocity: 87.00 cm/s MV E/A ratio:  0.89 Eleonore Chiquito MD Electronically signed by Eleonore Chiquito MD Signature Date/Time: 03/12/2021/11:42:01 AM    Final    CT Angio Chest/Abd/Pel for Dissection W and/or Wo Contrast  Result Date: 03/10/2021 CLINICAL DATA:  Chest pain, back pain, hypertension, dyspnea EXAM: CT ANGIOGRAPHY CHEST, ABDOMEN AND PELVIS TECHNIQUE: Non-contrast CT of the chest was initially obtained. Multidetector CT imaging through the chest, abdomen and pelvis was performed using the standard protocol during bolus administration of intravenous contrast. Multiplanar reconstructed images and MIPs were obtained and reviewed to evaluate the vascular anatomy. CONTRAST:  131mL OMNIPAQUE IOHEXOL 350 MG/ML SOLN COMPARISON:  None. FINDINGS: CTA CHEST FINDINGS Cardiovascular: The thoracic aorta is of normal caliber. No intramural hematoma, dissection, or aneurysm. Minimal atherosclerotic calcification within the descending thoracic aorta. Arch vasculature demonstrates normal anatomic configuration and is widely patent proximally.  No significant coronary artery calcification. Global cardiac size within normal limits. No pericardial effusion. The central pulmonary arteries are of normal caliber. Mediastinum/Nodes: Multinodular thyroid with progressive enlargement of dominant right thyroid nodule now measuring 2.1 cm in greatest dimension. No pathologic thoracic adenopathy. Esophagus unremarkable. Lungs/Pleura: Lungs are clear. No pleural effusion or pneumothorax. Musculoskeletal: Bilateral breast implants are noted. No acute bone abnormality. Review of the MIP images confirms the above findings. CTA ABDOMEN AND PELVIS FINDINGS VASCULAR Aorta: Minimal atherosclerotic calcification. Normal caliber. No aneurysm or dissection. No periaortic inflammatory change. Celiac: Less than 50% stenosis of the a origin of the celiac axis. Distally widely patent. No aneurysm or dissection. SMA: Widely patent.  No aneurysm or dissection. Renals: Single renal arteries are widely patent bilaterally. Beaded appearance of the mid segment of the right renal arteries in keeping with changes of fibromuscular dysplasia. No aneurysm. IMA: Widely patent Inflow: Widely patent.  No aneurysm or dissection. 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 There is narrowing of the left renal vein as it crosses the aorta,  however, and this may, in part, represent collateralization of the left renal vein secondary to a central stenosis. Review of the MIP images confirms the above findings. NON-VASCULAR Hepatobiliary: No focal liver abnormality is seen. Status post cholecystectomy. No biliary dilatation. Pancreas: Unremarkable Spleen: Unremarkable Adrenals/Urinary Tract: The adrenal glands are unremarkable. The kidneys are normal in size and position. Simple exophytic cortical cyst arises from the upper pole of the left kidney. The kidneys  are otherwise unremarkable. The bladder is unremarkable. Stomach/Bowel: The stomach, small bowel, and large bowel are unremarkable. No free intraperitoneal gas or fluid. Lymphatic: No pathologic adenopathy within the abdomen and pelvis. Reproductive: Aside from adnexal varices, the pelvic organs are unremarkable. Other: No abdominal wall hernia.  Rectum is unremarkable Musculoskeletal: No acute bone abnormality. No lytic or blastic bone lesion. Review of the MIP images confirms the above findings. IMPRESSION: No evidence of thoracoabdominal aortic aneurysm or dissection. Minimal atherosclerotic plaque. No acute intrathoracic or intra-abdominal pathology identified. Enlarging dominant right thyroid nodule. Recommend thyroid US (ref: J Am Coll Radiol. 2015 Feb;12(2): 143-50). Beaded appearance of the a right renal artery in keeping with changes of fibromuscular dysplasia. Marked dilation of the ovarian veins bilaterally, with extensive bilateral adnexal varices. Narrowing of the left renal vein centrally. 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 This appears unchanged from prior examination. Aortic Atherosclerosis (ICD10-I70.0). Electronically Signed   By: Fidela Salisbury MD   On: 03/10/2021 23:53    Subjective: Eager to go home  Discharge Exam: Vitals:   03/12/21 0540 03/12/21 1419  BP: (!) 147/71 (!) 152/77  Pulse: (!) 57 60  Resp: 16 12  Temp: 97.8 F (36.6 C) 98.1 F (36.7 C)  SpO2: 97% 99%   Vitals:   03/11/21 2045 03/11/21 2301 03/12/21 0540 03/12/21 1419  BP: (!) 144/72 (!) 163/75 (!) 147/71 (!) 152/77  Pulse: 64 65 (!) 57 60  Resp: 16 16 16 12   Temp: 98 F (36.7 C) 98.1 F (36.7 C) 97.8 F (36.6 C) 98.1 F (36.7 C)  TempSrc: Oral Oral Oral Oral  SpO2: 99% 99% 97% 99%  Weight:      Height:        General: Pt is alert,  awake, not in acute distress Cardiovascular: RRR, S1/S2 + Respiratory: CTA bilaterally, no wheezing, no rhonchi Abdominal: Soft, NT, ND, bowel sounds + Extremities: no edema, no cyanosis   The results of significant diagnostics from this hospitalization (including imaging, microbiology, ancillary and laboratory) are listed below for reference.     Microbiology: Recent Results (from the past 240 hour(s))  Resp Panel by RT-PCR (Flu A&B, Covid) Nasopharyngeal Swab     Status: None   Collection Time: 03/10/21 10:36 PM   Specimen: Nasopharyngeal Swab; Nasopharyngeal(NP) swabs in vial transport medium  Result Value Ref Range Status   SARS Coronavirus 2 by RT PCR NEGATIVE NEGATIVE Final    Comment: (NOTE) SARS-CoV-2 target nucleic acids are NOT DETECTED.  The SARS-CoV-2 RNA is generally detectable in upper respiratory specimens during the acute phase of infection. The lowest concentration of SARS-CoV-2 viral copies this assay can detect is 138 copies/mL. A negative result does not preclude SARS-Cov-2 infection and should not be used as the sole basis for treatment or other patient management decisions. A negative result may occur with  improper specimen collection/handling, submission of specimen other than nasopharyngeal swab, presence of viral  mutation(s) within the areas targeted by this assay, and inadequate number of viral copies(<138 copies/mL). A negative result must be combined with clinical observations, patient history, and epidemiological information. The expected result is Negative.  Fact Sheet for Patients:  EntrepreneurPulse.com.au  Fact Sheet for Healthcare Providers:  IncredibleEmployment.be  This test is no t yet approved or cleared by the [place] FDA and  has been authorized for detection and/or diagnosis of SARS-CoV-2 by FDA under an Emergency Use Authorization (EUA). This EUA will remain  in effect (meaning this test can  be used) for the duration of the COVID-19 declaration under Section 564(b)(1) of the Act, 21 U.S.C.section 360bbb-3(b)(1), unless the authorization is terminated  or revoked sooner.       Influenza A by PCR NEGATIVE NEGATIVE Final   Influenza B by PCR NEGATIVE NEGATIVE Final    Comment: (NOTE) The Xpert Xpress SARS-CoV-2/FLU/RSV plus assay is intended as an aid in the diagnosis of influenza from Nasopharyngeal swab specimens and should not be used as a sole basis for treatment. Nasal washings and aspirates are unacceptable for Xpert Xpress SARS-CoV-2/FLU/RSV testing.  Fact Sheet for Patients: EntrepreneurPulse.com.au  Fact Sheet for Healthcare Providers: IncredibleEmployment.be  This test is not yet approved or cleared by the [place] FDA and has been authorized for detection and/or diagnosis of SARS-CoV-2 by FDA under an Emergency Use Authorization (EUA). This EUA will remain in effect (meaning this test can be used) for the duration of the COVID-19 declaration under Section 564(b)(1) of the Act, 21 U.S.C. section 360bbb-3(b)(1), unless the authorization is terminated or revoked.  Performed at [facility], [address] [place], [place] [address]      Labs: BNP (last 3 results) No results for input(s): BNP in the last 8760 hours. Basic Metabolic Panel: Recent Labs  Lab 03/10/21 2030 03/12/21 0122  NA 137 136  K 3.9 3.6  CL 100 105  CO2 30 24  GLUCOSE 112* 82  BUN 20 11  CREATININE 0.76 0.72  CALCIUM 8.7* 8.4*   Liver Function Tests: Recent Labs  Lab 03/10/21 2236 03/12/21 0122  AST 10* 14*  ALT 5 5  ALKPHOS 46 62  BILITOT 0.2* 0.6  PROT 4.3* 5.3*  ALBUMIN 2.6* 3.1*   Recent Labs  Lab 03/10/21 2236  LIPASE 31   No results for input(s): AMMONIA in the last 168 hours. CBC: Recent Labs  Lab 03/10/21 2030 03/12/21 0122  WBC 7.6 6.2  HGB 15.0 13.8  HCT 44.0 40.4  MCV 95.0 95.1  PLT 333 302    Cardiac Enzymes: No results for input(s): CKTOTAL, CKMB, CKMBINDEX, TROPONINI in the last 168 hours. BNP: Invalid input(s): POCBNP CBG: No results for input(s): GLUCAP in the last 168 hours. D-Dimer No results for input(s): DDIMER in the last 72 hours. Hgb A1c No results for input(s): HGBA1C in the last 72 hours. Lipid Profile No results for input(s): CHOL, HDL, LDLCALC, TRIG, CHOLHDL, LDLDIRECT in the last 72 hours. Thyroid function studies Recent Labs    03/12/21 0940  TSH 0.340*   Anemia work up No results for input(s): VITAMINB12, FOLATE, FERRITIN, TIBC, IRON, RETICCTPCT in the last 72 hours. Urinalysis    Component Value Date/Time   COLORURINE YELLOW 11/12/2014 1655   APPEARANCEUR CLEAR 11/12/2014 1655   LABSPEC 1.003 (L) 11/12/2014 1655   PHURINE 6.0 11/12/2014 1655   GLUCOSEU NEGATIVE 11/12/2014 1655   HGBUR NEGATIVE 11/12/2014 1655   BILIRUBINUR NEGATIVE 11/12/2014 [place] 11/12/2014  [place] 11/12/2014 1655   UROBILINOGEN 0.2 11/12/2014 1655   NITRITE NEGATIVE 11/12/2014 1655   LEUKOCYTESUR TRACE (A) 11/12/2014 1655   Sepsis Labs Invalid input(s): PROCALCITONIN,  WBC,  LACTICIDVEN Microbiology Recent Results (from the past 240 hour(s))  Resp Panel by RT-PCR (Flu A&B, Covid) Nasopharyngeal Swab     Status: None   Collection Time: 03/10/21 10:36 PM   Specimen: Nasopharyngeal Swab; Nasopharyngeal(NP) swabs in vial transport medium  Result Value Ref Range Status   SARS Coronavirus 2 by RT PCR NEGATIVE NEGATIVE Final    Comment: (NOTE) SARS-CoV-2 target nucleic acids are NOT DETECTED.  The SARS-CoV-2 RNA is generally detectable in upper respiratory specimens during the acute phase of infection. The lowest concentration of SARS-CoV-2 viral copies this assay can detect is 138 copies/mL. A negative result does not preclude SARS-Cov-2 infection and should not be used as the sole basis for treatment or other patient management  decisions. A negative result may occur with  improper specimen collection/handling, submission of specimen other than nasopharyngeal swab, presence of viral mutation(s) within the areas targeted by this assay, and inadequate number of viral copies(<138 copies/mL). A negative result must be combined with clinical observations, patient history, and epidemiological information. The expected result is Negative.  Fact Sheet for Patients:  EntrepreneurPulse.com.au  Fact Sheet for Healthcare Providers:  IncredibleEmployment.be  This test is no t yet approved or cleared by the [place] FDA and  has been authorized for detection and/or diagnosis of SARS-CoV-2 by FDA under an Emergency Use Authorization (EUA). This EUA will remain  in effect (meaning this test can be used) for the duration of the COVID-19 declaration under Section 564(b)(1) of the Act, 21 U.S.C.section 360bbb-3(b)(1), unless the authorization is terminated  or revoked sooner.       Influenza A by PCR NEGATIVE NEGATIVE Final   Influenza B by PCR NEGATIVE NEGATIVE Final    Comment: (NOTE) The Xpert Xpress SARS-CoV-2/FLU/RSV plus assay is intended as an aid in the diagnosis of influenza from Nasopharyngeal swab specimens and should not be used as a sole basis for treatment. Nasal washings and aspirates are unacceptable for Xpert Xpress SARS-CoV-2/FLU/RSV testing.  Fact Sheet for Patients: EntrepreneurPulse.com.au  Fact Sheet for Healthcare Providers: IncredibleEmployment.be  This test is not yet approved or cleared by the [place] FDA and has been authorized for detection and/or diagnosis of SARS-CoV-2 by FDA under an Emergency Use Authorization (EUA). This EUA will remain in effect (meaning this test can be used) for the duration of the COVID-19 declaration under Section 564(b)(1) of the Act, 21 U.S.C. section 360bbb-3(b)(1), unless the  authorization is terminated or revoked.  Performed at [facility], [address], [place], [place] [address]    Time spent: 30 min  SIGNED:   Marylu Lund, MD  [facility] 03/12/2021, 2:25 PM  If 7PM-7AM, please contact night-coverage

## 2021-03-12 NOTE — Care Management Obs Status (Signed)
[place] NOTIFICATION   Patient Details  Name: Fraida Veldman MRN: [id number] Date of Birth: May 18, 1951   Medicare Observation Status Notification Given:  Yes    Bethena Roys, RN 03/12/2021, 10:09 AM

## 2021-03-12 NOTE — Plan of Care (Signed)

## 2021-03-12 NOTE — Progress Notes (Addendum)
Progress Note  Patient Name: Melinda Lane Date of Encounter: 03/12/2021  [facility] Cardiologist: Fransico Him, MD new  Subjective   No more CP, feels better today  Inpatient Medications    Scheduled Meds:  amLODipine  5 mg Oral Daily   aspirin  81 mg Oral Daily   atorvastatin  40 mg Oral Daily   carbidopa-levodopa  1 tablet Oral TID   heparin injection (subcutaneous)  5,000 Units Subcutaneous Q8H   mesalamine  1,000 mg Oral BID   metoprolol tartrate  25 mg Oral BID   pantoprazole  40 mg Oral Q1200   sodium chloride flush  3 mL Intravenous Q12H   Continuous Infusions:  sodium chloride     PRN Meds: sodium chloride, acetaminophen **OR** acetaminophen, acetaminophen, diazepam, morphine injection, nitroGLYCERIN, ondansetron **OR** ondansetron (ZOFRAN) IV, sodium chloride flush   Vital Signs    Vitals:   03/11/21 1835 03/11/21 2045 03/11/21 2301 03/12/21 0540  BP:  (!) 144/72 (!) 163/75 (!) 147/71  Pulse: 65 64 65 (!) 57  Resp:  16 16 16   Temp:  98 F (36.7 C) 98.1 F (36.7 C) 97.8 F (36.6 C)  TempSrc:  Oral Oral Oral  SpO2: 99% 99% 99% 97%  Weight:      Height:        Intake/Output Summary (Last 24 hours) at 03/12/2021 7253 Last data filed at 03/11/2021 1738 Gross per 24 hour  Intake 175.39 ml  Output --  Net 175.39 ml   Last 3 Weights 03/11/2021 03/10/2021 09/17/2019  Weight (lbs) 130 lb 4.8 oz 134 lb 6.4 oz 136 lb  Weight (kg) 59.104 kg 60.963 kg 61.689 kg      Telemetry    SR - Personally Reviewed  ECG    None today - Personally Reviewed  Physical Exam   GEN: No acute distress.   Neck: No JVD Cardiac: RRR, 2-3/6 murmur, no rubs, or gallops.  Respiratory: Clear to auscultation bilaterally. GI: Soft, nontender, non-distended  MS: No edema; No deformity. R radial cath site w/out ecchymosis or hematoma Neuro:  Nonfocal  Psych: Normal affect   Labs    High Sensitivity Troponin:   Recent Labs  Lab 03/10/21 2030 03/10/21 2236 03/11/21 0106  03/11/21 0305 03/11/21 0919  TROPONINIHS 12 62* 104* 174* 210*      Chemistry Recent Labs  Lab 03/10/21 2030 03/10/21 2236 03/12/21 0122  NA 137  --  136  K 3.9  --  3.6  CL 100  --  105  CO2 30  --  24  GLUCOSE 112*  --  82  BUN 20  --  11  CREATININE 0.76  --  0.72  CALCIUM 8.7*  --  8.4*  PROT  --  4.3* 5.3*  ALBUMIN  --  2.6* 3.1*  AST  --  10* 14*  ALT  --  5 5  ALKPHOS  --  46 62  BILITOT  --  0.2* 0.6  GFRNONAA >60  --  >60  ANIONGAP 7  --  7     Hematology Recent Labs  Lab 03/10/21 2030 03/12/21 0122  WBC 7.6 6.2  RBC 4.63 4.25  HGB 15.0 13.8  HCT 44.0 40.4  MCV 95.0 95.1  MCH 32.4 32.5  MCHC 34.1 34.2  RDW 14.4 14.5  PLT 333 302   Lab Results  Component Value Date   CHOL 172 04/12/2012   HDL 69 04/12/2012   LDLCALC 95 04/12/2012   TRIG 40 04/12/2012  CHOLHDL 2.5 04/12/2012   No results found for: HGBA1C Lab Results  Component Value Date   TSH 0.178 (L) 04/12/2012     BNPNo results for input(s): BNP, PROBNP in the last 168 hours.   DDimer No results for input(s): DDIMER in the last 168 hours.   Radiology    DG Chest 2 View  Result Date: 03/10/2021 CLINICAL DATA:  Chest pain EXAM: CHEST - 2 VIEW COMPARISON:  04/11/2012 FINDINGS: Heart and mediastinal contours are within normal limits. No focal opacities or effusions. No acute bony abnormality. IMPRESSION: No active cardiopulmonary disease. Electronically Signed   By: Rolm Baptise M.D.   On: 03/10/2021 21:06   CARDIAC CATHETERIZATION  Result Date: 03/11/2021  Ost LAD to Prox LAD lesion is 20% stenosed.  1st Mrg lesion is 15% stenosed.  Prox RCA lesion is 10% stenosed.  The left ventricular systolic function is normal.  LV end diastolic pressure is normal.  No significant coronary obstructive disease with mild smooth eccentric proximal LAD, ostial OM1, and proximal RCA narrowings. Normal LV function without focal segmental wall motion abnormalities.  LVEDP 11 mmHg. RECOMMENDATION: We  will initiate aspirin therapy.  Consider possible transient coronary vasospasm in the etiology of her chest discomfort.  Medical therapy for optimal blood pressure control and with her recent blood pressure elevation will increase amlodipine to 5 mg daily.  Aggressive lipid-lowering therapy with target LDL less than 70.    CT Angio Chest/Abd/Pel for Dissection W and/or Wo Contrast  Result Date: 03/10/2021 CLINICAL DATA:  Chest pain, back pain, hypertension, dyspnea EXAM: CT ANGIOGRAPHY CHEST, ABDOMEN AND PELVIS TECHNIQUE: Non-contrast CT of the chest was initially obtained. Multidetector CT imaging through the chest, abdomen and pelvis was performed using the standard protocol during bolus administration of intravenous contrast. Multiplanar reconstructed images and MIPs were obtained and reviewed to evaluate the vascular anatomy. CONTRAST:  134mL OMNIPAQUE IOHEXOL 350 MG/ML SOLN COMPARISON:  None. FINDINGS: CTA CHEST FINDINGS Cardiovascular: The thoracic aorta is of normal caliber. No intramural hematoma, dissection, or aneurysm. Minimal atherosclerotic calcification within the descending thoracic aorta. Arch vasculature demonstrates normal anatomic configuration and is widely patent proximally. No significant coronary artery calcification. Global cardiac size within normal limits. No pericardial effusion. The central pulmonary arteries are of normal caliber. Mediastinum/Nodes: Multinodular thyroid with progressive enlargement of dominant right thyroid nodule now measuring 2.1 cm in greatest dimension. No pathologic thoracic adenopathy. Esophagus unremarkable. Lungs/Pleura: Lungs are clear. No pleural effusion or pneumothorax. Musculoskeletal: Bilateral breast implants are noted. No acute bone abnormality. Review of the MIP images confirms the above findings. CTA ABDOMEN AND PELVIS FINDINGS VASCULAR Aorta: Minimal atherosclerotic calcification. Normal caliber. No aneurysm or dissection. No periaortic  inflammatory change. Celiac: Less than 50% stenosis of the a origin of the celiac axis. Distally widely patent. No aneurysm or dissection. SMA: Widely patent.  No aneurysm or dissection. Renals: Single renal arteries are widely patent bilaterally. Beaded appearance of the mid segment of the right renal arteries in keeping with changes of fibromuscular dysplasia. No aneurysm. IMA: Widely patent Inflow: Widely patent.  No aneurysm or dissection. 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 There is narrowing of the left renal vein as it crosses the aorta, however, and this may, in part, represent collateralization of the left renal vein secondary to a central stenosis. Review of the MIP images confirms the above findings. NON-VASCULAR Hepatobiliary: No focal  liver abnormality is seen. Status post cholecystectomy. No biliary dilatation. Pancreas: Unremarkable Spleen: Unremarkable Adrenals/Urinary Tract: The adrenal glands are unremarkable. The kidneys are normal in size and position. Simple exophytic cortical cyst arises from the upper pole of the left kidney. The kidneys are otherwise unremarkable. The bladder is unremarkable. Stomach/Bowel: The stomach, small bowel, and large bowel are unremarkable. No free intraperitoneal gas or fluid. Lymphatic: No pathologic adenopathy within the abdomen and pelvis. Reproductive: Aside from adnexal varices, the pelvic organs are unremarkable. Other: No abdominal wall hernia.  Rectum is unremarkable Musculoskeletal: No acute bone abnormality. No lytic or blastic bone lesion. Review of the MIP images confirms the above findings. IMPRESSION: No evidence of thoracoabdominal aortic aneurysm or dissection. Minimal atherosclerotic plaque. No acute intrathoracic or intra-abdominal pathology identified. Enlarging dominant right thyroid nodule.  Recommend thyroid US (ref: J Am Coll Radiol. 2015 Feb;12(2): 143-50). Beaded appearance of the a right renal artery in keeping with changes of fibromuscular dysplasia. Marked dilation of the ovarian veins bilaterally, with extensive bilateral adnexal varices. Narrowing of the left renal vein centrally. 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 This appears unchanged from prior examination. Aortic Atherosclerosis (ICD10-I70.0). Electronically Signed   By: Fidela Salisbury MD   On: 03/10/2021 23:53    Cardiac Studies   ECHO: Ordered  Cath: 03/11/2021 Ost LAD to Prox LAD lesion is 20% stenosed. 1st Mrg lesion is 15% stenosed. Prox RCA lesion is 10% stenosed. The left ventricular systolic function is normal. LV end diastolic pressure is normal.   No significant coronary obstructive disease with mild smooth eccentric proximal LAD, ostial OM1, and proximal RCA narrowings.   Normal LV function without focal segmental wall motion abnormalities.  LVEDP 11 mmHg.   RECOMMENDATION: We will initiate aspirin therapy.  Consider possible transient coronary vasospasm in the etiology of her chest discomfort.  Medical therapy for optimal blood pressure control and with her recent blood pressure elevation will increase amlodipine to 5 mg daily.  Aggressive lipid-lowering therapy with target LDL less than 70.    Patient Profile     70 y.o. female with hx HTN, HLD, Hypothyroid, Parkinson's dz, IBS, was admitted 06/29 with chest pain, elevated troponin.  Assessment & Plan    Chest pain - no sig CAD, concern for spasm - BP also not well controlled, amlodipine increased, this will help w/ both - continue ASA, BB, statin - discuss Imdur w/ MD - f/u on echo results  2. HTN - BP range 126/62- 163/75 last 24 hr, but SBP generally >140 - amlodipine 2.5  mg > 5 mg - home telmisartan held - if telmisartan restarted, that should do it.  3. Hypothyroid - takes Thyroid NP, unsure of dose, ?90 mcg (she said mg) - will ck TSH  4. HLD, goal LDL < 70 - LDL 95 on Lipitor 40 mg qd - discuss change to Crestor or increase Lipitor  5. IBS, meds - takes mesalamine for this - also takes Mobic 15 mg qd - Dr Irish Lack recommended ASA 81 mg - ibuprofen is on med list, not taking it - MD to advise if taking all of these is ok     Otherwise, per IM Active Problems:   Chest pain   Elevated troponin      For questions or updates, please contact [place] Please consult [email or web address] for contact info under  Signed, Rosaria Ferries, PA-C  03/12/2021, 8:22 AM

## 2021-03-12 NOTE — Progress Notes (Signed)
  Echocardiogram 2D Echocardiogram has been performed.  Melinda Lane 03/12/2021, 9:06 AM

## 2021-03-12 NOTE — Plan of Care (Signed)
Problem: Education: Goal: Knowledge of General Education information will improve Description: Including pain rating scale, medication(s)/side effects and non-pharmacologic comfort measures 03/12/2021 1427 by Camillia Herter, RN Outcome: Adequate for Discharge 03/12/2021 1427 by Camillia Herter, RN Outcome: Adequate for Discharge 03/12/2021 [phone] by Camillia Herter, RN Outcome: Progressing   Problem: Health Behavior/Discharge Planning: Goal: Ability to manage health-related needs will improve 03/12/2021 1427 by Camillia Herter, RN Outcome: Adequate for Discharge 03/12/2021 1427 by Camillia Herter, RN Outcome: Adequate for Discharge 03/12/2021 [phone] by Camillia Herter, RN Outcome: Progressing   Problem: Clinical Measurements: Goal: Ability to maintain clinical measurements within normal limits will improve 03/12/2021 1427 by Camillia Herter, RN Outcome: Adequate for Discharge 03/12/2021 1427 by Camillia Herter, RN Outcome: Adequate for Discharge 03/12/2021 [phone] by Camillia Herter, RN Outcome: Progressing Goal: Will remain free from infection 03/12/2021 1427 by Camillia Herter, RN Outcome: Adequate for Discharge 03/12/2021 1427 by Camillia Herter, RN Outcome: Adequate for Discharge 03/12/2021 [phone] by Camillia Herter, RN Outcome: Progressing Goal: Diagnostic test results will improve 03/12/2021 1427 by Camillia Herter, RN Outcome: Adequate for Discharge 03/12/2021 1427 by Camillia Herter, RN Outcome: Adequate for Discharge 03/12/2021 [phone] by Camillia Herter, RN Outcome: Progressing Goal: Respiratory complications will improve 03/12/2021 1427 by Camillia Herter, RN Outcome: Adequate for Discharge 03/12/2021 1427 by Camillia Herter, RN Outcome: Adequate for Discharge 03/12/2021 [phone] by Camillia Herter, RN Outcome: Progressing Goal: Cardiovascular complication will be avoided 03/12/2021 1427 by Camillia Herter, RN Outcome: Adequate for Discharge 03/12/2021 1427 by Camillia Herter, RN Outcome: Adequate for  Discharge 03/12/2021 [phone] by Camillia Herter, RN Outcome: Progressing   Problem: Activity: Goal: Risk for activity intolerance will decrease 03/12/2021 1427 by Camillia Herter, RN Outcome: Adequate for Discharge 03/12/2021 1427 by Camillia Herter, RN Outcome: Adequate for Discharge 03/12/2021 [phone] by Camillia Herter, RN Outcome: Progressing   Problem: Nutrition: Goal: Adequate nutrition will be maintained 03/12/2021 1427 by Camillia Herter, RN Outcome: Adequate for Discharge 03/12/2021 1427 by Camillia Herter, RN Outcome: Adequate for Discharge 03/12/2021 [phone] by Camillia Herter, RN Outcome: Progressing   Problem: Coping: Goal: Level of anxiety will decrease 03/12/2021 1427 by Camillia Herter, RN Outcome: Adequate for Discharge 03/12/2021 1427 by Camillia Herter, RN Outcome: Adequate for Discharge 03/12/2021 [phone] by Camillia Herter, RN Outcome: Progressing   Problem: Elimination: Goal: Will not experience complications related to bowel motility 03/12/2021 1427 by Camillia Herter, RN Outcome: Adequate for Discharge 03/12/2021 1427 by Camillia Herter, RN Outcome: Adequate for Discharge 03/12/2021 [phone] by Camillia Herter, RN Outcome: Progressing Goal: Will not experience complications related to urinary retention 03/12/2021 1427 by Camillia Herter, RN Outcome: Adequate for Discharge 03/12/2021 1427 by Camillia Herter, RN Outcome: Adequate for Discharge 03/12/2021 [phone] by Camillia Herter, RN Outcome: Progressing   Problem: Pain Managment: Goal: General experience of comfort will improve 03/12/2021 1427 by Camillia Herter, RN Outcome: Adequate for Discharge 03/12/2021 1427 by Camillia Herter, RN Outcome: Adequate for Discharge 03/12/2021 [phone] by Camillia Herter, RN Outcome: Progressing   Problem: Safety: Goal: Ability to remain free from injury will improve 03/12/2021 1427 by Camillia Herter, RN Outcome: Adequate for Discharge 03/12/2021 1427 by Camillia Herter, RN Outcome: Adequate for  Discharge 03/12/2021 [phone] by Camillia Herter, RN Outcome: Progressing   Problem: Skin Integrity: Goal: Risk for impaired skin integrity will  decrease 03/12/2021 1427 by Camillia Herter, RN Outcome: Adequate for Discharge 03/12/2021 1427 by Camillia Herter, RN Outcome: Adequate for Discharge 03/12/2021 [phone] by Camillia Herter, RN Outcome: Progressing

## 2021-03-19 ENCOUNTER — Telehealth: Payer: Self-pay | Admitting: Neurology

## 2021-03-19 MED ORDER — PRIMIDONE 50 MG PO TABS: 100.0000 mg | ORAL_TABLET | Freq: Three times a day (TID) | ORAL | 1 refills | Status: DC

## 2021-03-19 MED ORDER — CARBIDOPA-LEVODOPA 25-100 MG PO TABS: ORAL_TABLET | ORAL | 3 refills | Status: DC

## 2021-03-19 NOTE — Telephone Encounter (Signed)
Pt request refill primidone (MYSOLINE) 50 MG tabletand carbidopa-levodopa (SINEMET IR) 25-100 MG tablet at [place] [phone]

## 2021-03-19 NOTE — Telephone Encounter (Signed)
Refill has been sent. Pt has pending f/u in august with Dr. Jannifer Franklin.

## 2021-03-30 DIAGNOSIS — N280 Ischemia and infarction of kidney: Secondary | ICD-10-CM | POA: Insufficient documentation

## 2021-03-31 ENCOUNTER — Ambulatory Visit: Payer: Medicare Other | Admitting: Cardiology

## 2021-04-14 ENCOUNTER — Encounter: Payer: Self-pay | Admitting: Neurology

## 2021-04-14 ENCOUNTER — Ambulatory Visit: Payer: Medicare Other | Admitting: Neurology

## 2021-04-14 ENCOUNTER — Other Ambulatory Visit: Payer: Self-pay

## 2021-04-14 VITALS — BP 108/62 | HR 79 | Ht 65.0 in | Wt 130.2 lb

## 2021-04-14 DIAGNOSIS — G20 Parkinson's disease: Secondary | ICD-10-CM

## 2021-04-14 MED ORDER — CARBIDOPA-LEVODOPA 25-100 MG PO TABS: ORAL_TABLET | ORAL | 3 refills | Status: DC

## 2021-04-14 NOTE — Progress Notes (Signed)
Reason for visit: Parkinson's disease  Melinda Lane is an 70 y.o. female  History of present illness:  Melinda Lane is a 70 year old right-handed white female with a history of a right upper extremity tremor.  The patient has been felt to have Parkinson's disease, she was placed on Sinemet taking 25/100 mg tablets, 1 tablet 3 times daily.  The patient has also been treated through Dr. Sabra Heck in the past, he has treated her for essential tremor.  She is now on Mysoline taking 100 mg 3 times daily, she is tolerating the medication well and she does believe that this has helped her tremor.  She has noted some ongoing problems with micrographia and some recent troubles with getting up off the floor if she is playing with her grandchildren.  She has not had any falls.  She denies any leg tremor.  She has not had any problems with speech or swallowing.  She returns to this office for further evaluation.  She does take Benadryl at night which helps her tremors.  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[facility])    RLS (restless legs syndrome) 10/10/2019   Thyroid  nodule 04/12/2012   Tremor 10/10/2019    Past Surgical History:  Procedure Laterality Date   APPENDECTOMY     AUGMENTATION MAMMAPLASTY     saline - 2015   CHOLECYSTECTOMY     ECTOPIC PREGNANCY SURGERY     KNEE SURGERY     LEFT HEART CATH AND CORONARY ANGIOGRAPHY N/A 03/11/2021   Procedure: LEFT HEART CATH AND CORONARY ANGIOGRAPHY;  Surgeon: Troy Sine, MD;  Location: [facility];  Service: Cardiovascular;  Laterality: N/A;   TONSILLECTOMY      Family History  Problem Relation Age of Onset   Hypertension Father    Heart disease Father    Hypertension Mother    Heart disease Mother     Social history:  reports that she has never smoked. She has never used smokeless tobacco. She reports current alcohol use. She reports that she does not use drugs.    Allergies  Allergen Reactions   Zithromax [Azithromycin] Rash    Medications:  Prior to Admission medications   Medication Sig Start Date End Date Taking? Authorizing Provider  carbidopa-levodopa (SINEMET IR) 25-100 MG tablet TAKE 1/2 TABLET BY MOUTH THREE TIMES DAILY FOR 4 WEEKS THEN TAKE 1 TABLET BY MOUTH THREE TIMES DAILY 03/19/21  Yes Kathrynn Ducking, MD  cholecalciferol (VITAMIN D) 1000 UNITS tablet Take 1,000 Units by mouth daily.   Yes [provider]  dicyclomine (BENTYL) 10 MG capsule Take 10 mg by mouth 3 (three) times daily as needed  for cramping. 01/20/21  Yes [provider]  ferrous sulfate 325 (65 FE) MG tablet Take 325 mg by mouth daily with breakfast.   Yes [provider]  mesalamine (PENTASA) 500 MG CR capsule Take 1,000 mg by mouth 2 (two) times daily.    Yes [provider]  Multiple Vitamin (MULTIVITAMIN) tablet Take 1 tablet by mouth daily.   Yes [provider]  NONFORMULARY OR COMPOUNDED ITEM Apply 4-6 tablets topically every 4 (four) months. Estrogen and Testosterone pellets put under the skin   Yes [provider]  NP THYROID 90 MG tablet Take  90 mg by mouth every morning. 03/04/21  Yes [provider]  Omega-3 Fatty Acids (FISH OIL) 1000 MG CAPS Take 1,000 mg by mouth daily.   Yes [provider]  primidone (MYSOLINE) 50 MG tablet Take 2 tablets (100 mg total) by mouth 3 (three) times daily with meals. 03/19/21  Yes Kathrynn Ducking, MD  progesterone (PROMETRIUM) 200 MG capsule Take 300 mg by mouth daily. Compound Rx   Yes [provider]  amLODipine (NORVASC) 5 MG tablet Take 1 tablet (5 mg total) by mouth daily. 03/13/21 04/12/21  Donne Hazel, MD    ROS:  Out of a complete 14 system review of symptoms, the patient complains only of the following symptoms, and all other reviewed systems are negative.  Tremor  Blood pressure 108/62, pulse 79, height '5\' 5"'$  (1.651 m), weight 130 lb 4 oz (59.1 kg), SpO2 98 %.  Physical Exam  General: The patient is alert and cooperative at the time of the examination.  Skin: No significant peripheral edema is noted.   Neurologic Exam  Mental status: The patient is alert and oriented x 3 at the time of the examination. The patient has apparent normal recent and remote memory, with an apparently normal attention span and concentration ability.   Cranial nerves: Facial symmetry is present. Speech is normal, no aphasia or dysarthria is noted. Extraocular movements are full, with exception of some restriction of superior gaze. Visual fields are full.  Masking the face is seen.  Motor: The patient has good strength in all 4 extremities.  Sensory examination: Soft touch sensation is symmetric on the face, arms, and legs.  Coordination: The patient has good finger-nose-finger and heel-to-shin bilaterally.  Resting tremor involving the right upper extremity is noted intermittently.  When performing handwriting, drawing a spiral, no tremor is noted.  Gait and station: The patient is able to arise from a seated position with arms crossed.  Once up, the patient appears to  have a slightly stooped posture, decreased arm swing bilaterally is seen, resting tremor involving the right upper extremity is noted with walking.  Tandem gait is unremarkable.  Romberg is negative.  No drift is seen.  Reflexes: Deep tendon reflexes are symmetric.   Assessment/Plan:  1.  Parkinson's disease  The patient has features of parkinsonism.  She has masked face, decreased eye blink, impairment of superior gaze, history of micrographia, and a unilateral resting tremor involving the right upper extremity, mildly stooped posture with walking and decreased arm swing bilaterally.  The patient however appears to have responded to the Mysoline for the tremor.  She will remain on low-dose Sinemet taking the 25/100 mg tablets 3 times daily.  The patient will follow-up in about 5 months.  If there are diagnostic concerns about whether this patient has parkinsonism or not, a DaTscan in the future could be done.  In the  future, the patient can be followed through Dr. Rexene Alberts.  Jill Alexanders MD 04/14/2021 12:30 PM  [facility] [address] [place], [place] [address]  Phone [phone] Fax [phone]

## 2021-04-27 ENCOUNTER — Other Ambulatory Visit (HOSPITAL_COMMUNITY): Payer: Self-pay

## 2021-05-11 DIAGNOSIS — I773 Arterial fibromuscular dysplasia: Secondary | ICD-10-CM | POA: Insufficient documentation

## 2021-09-09 ENCOUNTER — Other Ambulatory Visit: Payer: Self-pay

## 2021-09-09 MED ORDER — PRIMIDONE 50 MG PO TABS: 100.0000 mg | ORAL_TABLET | Freq: Three times a day (TID) | ORAL | 1 refills | Status: DC

## 2021-10-06 ENCOUNTER — Telehealth: Payer: Self-pay | Admitting: Neurology

## 2021-10-06 ENCOUNTER — Encounter: Payer: Self-pay | Admitting: Neurology

## 2021-10-06 ENCOUNTER — Ambulatory Visit: Payer: Medicare Other | Admitting: Neurology

## 2021-10-06 NOTE — Telephone Encounter (Signed)
Pt's no show was due to forgot appt was scheduled.

## 2021-11-20 ENCOUNTER — Other Ambulatory Visit: Payer: Self-pay | Admitting: Neurology

## 2021-11-23 NOTE — Telephone Encounter (Signed)
Rx refilled.

## 2021-12-07 ENCOUNTER — Encounter: Payer: Self-pay | Admitting: Neurology

## 2021-12-07 ENCOUNTER — Ambulatory Visit: Payer: Medicare Other | Admitting: Neurology

## 2021-12-07 VITALS — BP 157/83 | HR 74 | Ht 65.0 in | Wt 133.0 lb

## 2021-12-07 DIAGNOSIS — G20 Parkinson's disease: Secondary | ICD-10-CM

## 2021-12-07 NOTE — Progress Notes (Signed)
Subjective:  ?  ?Patient ID: Melinda Lane is a 71 y.o. female. ? ?HPI ? ? ? ?Interim history:  ? ?Melinda Lane is a 71 year old right-handed woman with an underlying medical history of hypothyroidism, Crohn's disease, hypertension, hyperlipidemia, irritable bowel syndrome, arthritis, and anxiety, who presents for follow-up consultation of her parkinsonism. The patient is unaccompanied today. Melinda missed an appointment on 10/06/2021. Melinda has previously seen Melinda Lane in this office and was last seen by him on 04/14/2021.  I reviewed the note and copied the note below for reference.  Melinda has been on low-dose Sinemet as well as Mysoline for her hand tremor.   ?Melinda had previously seen Melinda Lane for her tremor.  I reviewed his office note through [facility] on 01/30/2020.  At that time Melinda reported that the Sinemet was not helping her tremor.  Melinda was advised to stop Sinemet at the time.   ?Melinda was also evaluated by Melinda Lane office in March 2016 for headaches.  I reviewed her office note from 11/15/2014.  Melinda had a head CT without contrast on 11/12/2014 and I reviewed the results: IMPRESSION: ?No intracranial mass, hemorrhage, or focal gray -white compartment lesion/acute appearing infarct. Stable pineal calcification. ?Stability of this calcification over several years is consistent ?with benign etiology. ? ?Today, 12/07/2021 (all dictated new, as well as above notes, some dictation done in note pad or Word, outside of chart, may appear as copied):  ? ?Melinda reports feeling stable.  Melinda continues to take Mysoline 100 mg 3 times daily, typically at 1030, 2 PM, and 7 PM daily.  Melinda continues to take Sinemet IR 1 pill 3 times a day around 930, lunchtime and dinnertime.   ?Her symptoms date back to about 2 years ago when Melinda started having a right hand tremor.  Melinda feels that Melinda has progressed over time.  Her medications have been helpful, Melinda Lane has retired and Melinda continues to take Mysoline which he  originally prescribed.  Melinda has not had any recent falls, Melinda tries to stay active mentally and physically, Melinda likes to spend time with her granddaughter.  Melinda has caffeine in the form of tea, 1 or 2 cups/day, Melinda has alcohol in the form of wine, occasional use, Melinda is a non-smoker.  Melinda lives with her husband of about 45 years.  Melinda has no known family history of Parkinson's disease, her mom may have had a hand tremor in her later years, to be 59.  ? ?The patient's allergies, current medications, family history, past medical history, past social history, past surgical history and problem list were reviewed and updated as appropriate.  ?Previously:  ? ?04/14/21 (Melinda Lane): << Melinda Lane is a 71 year old right-handed white female with a history of a right upper extremity tremor.  The patient has been felt to have Parkinson's disease, Melinda was placed on Sinemet taking 25/100 mg tablets, 1 tablet 3 times daily.  The patient has also been treated through Dr. Sabra Lane in the past, he has treated her for essential tremor.  Melinda is now on Mysoline taking 100 mg 3 times daily, Melinda is tolerating the medication well and Melinda does believe that this has helped her tremor.  Melinda has noted some ongoing problems with micrographia and some recent troubles with getting up off the floor if Melinda is playing with her grandchildren.  Melinda has not had any falls.  Melinda denies any leg tremor.  Melinda has not had any problems with  speech or swallowing.  Melinda returns to this office for further evaluation.  Melinda does take Benadryl at night which helps her tremors.>> ? ? ?Her Past Medical History Is Significant For: ?Past Medical History:  ?Diagnosis Date  ? Absence of bladder continence 11/15/2014  ? Acquired hypothyroidism 04/12/2012  ? Adjustment disorder with anxiety 02/17/2013  ? patient states Melinda has no known diagnosis of this, doesn't take medication for this, states her primary care doesn't even have it noted in their records  ? 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 Arthralgia of both hands 11/22/2017  ? Benign renovascular hypertension 06/23/2011  ? Formatting of this note might be different from the original. right  ? Cephalalgia 11/15/2014  ? Chest pain 04/12/2012  ? Crohn's disease of ileum without complication ([place]) 40/06/2724  ? Elevated troponin   ? Esophageal dysphagia 01/13/2017  ? HTN (hypertension) 04/12/2012  ? Hyperlipemia 04/12/2012  ? Hyperlipidemia 04/12/2012  ? Hypothyroidism   ? IBS (irritable bowel syndrome)   ? Incontinence in female 11/12/2014  ? Left knee pain 10/14/2011  ? Parkinson's disease ([place]) 07/24/2019  ? Pes anserine bursitis 10/14/2011  ? Primary osteoarthritis involving multiple joints 11/25/2017  ? 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([facility])   ? RLS (restless legs syndrome) 10/10/2019  ? Thyroid nodule 04/12/2012  ? Tremor 10/10/2019  ? ? ?Her Past Surgical History Is Significant For: ?Past Surgical History:  ?Procedure Laterality Date  ? APPENDECTOMY    ? AUGMENTATION MAMMAPLASTY    ? saline - 2015  ? CHOLECYSTECTOMY    ? ECTOPIC PREGNANCY SURGERY    ? KNEE SURGERY    ? LEFT HEART CATH AND CORONARY ANGIOGRAPHY N/A 03/11/2021  ? Procedure: LEFT HEART CATH AND CORONARY ANGIOGRAPHY;  Surgeon: Troy Sine, MD;  Location: [facility];  Service: Cardiovascular;  Laterality: N/A;  ? TONSILLECTOMY    ? ? ?Her Family History Is Significant For: ?Family History  ?Problem Relation Age of Onset  ? Hypertension Father   ? Heart disease Father   ? Hypertension Mother   ? Heart disease Mother   ? ? ?Her Social History Is Significant For: ?Social History  ? ?Socioeconomic History  ? Marital status: Married  ?  Spouse name: Not on file  ? Number of children: 2  ? Years of education: HS  ? Highest education level: Not on file  ?Occupational History  ? Occupation: Accounting  ?Tobacco Use  ? Smoking status: Never  ? Smokeless tobacco: Never  ?Vaping Use  ? Vaping Use: Never used  ?Substance and Sexual Activity  ? Alcohol  use: Yes  ?  Alcohol/week: 2.0 standard drinks  ?  Types: 2 Glasses of wine per week  ?  Comment: One weekly - 2 glasses of wine  ? Drug use: No  ? Sexual activity: Yes  ?  Birth control/protection: Post-menopausal  ?  Comment: 1st intercourse 71 yo-Fewer than 5 partners  ?Other Topics Concern  ? Not on file  ?Social History Narrative  ? Lives at home with her husband.  ? Right-handed.  ? 1 cup/day of tea   ? ?Social Determinants of Health  ? ?Financial Resource Strain: Not on file  ?Food Insecurity: Not on file  ?Transportation Needs: Not on file  ?Physical Activity: Not on file  ?Stress: Not on file  ?Social Connections: Not on file  ? ? ?Her Allergies Are:  ?Allergies  ?Allergen Reactions  ? Zithromax [Azithromycin] Rash  ?:  ? ?Her Current Medications Are:  ?  Outpatient Encounter Medications as of 12/07/2021  ?Medication Sig  ? amLODipine (NORVASC) 5 MG tablet Take 1 tablet (5 mg total) by mouth daily. (Patient taking differently: Take 5 mg by mouth 2 (two) times daily.)  ? carbidopa-levodopa (SINEMET IR) 25-100 MG tablet TAKE 1 TABLET BY MOUTH THREE TIMES DAILY  ? celecoxib (CELEBREX) 200 MG capsule Take 200 mg by mouth daily.  ? cholecalciferol (VITAMIN D) 1000 UNITS tablet Take 1,000 Units by mouth daily.  ? dicyclomine (BENTYL) 20 MG tablet Take 20 mg by mouth 4 (four) times daily.  ? ferrous sulfate 325 (65 FE) MG tablet Take 325 mg by mouth daily with breakfast.  ? mesalamine (PENTASA) 500 MG CR capsule Take 1,000 mg by mouth 2 (two) times daily.   ? Multiple Vitamin (MULTIVITAMIN) tablet Take 1 tablet by mouth daily.  ? NONFORMULARY OR COMPOUNDED ITEM Apply 4-6 tablets topically every 4 (four) months. Estrogen and Testosterone pellets put under the skin  ? NP THYROID 90 MG tablet Take 90 mg by mouth every morning.  ? Omega-3 Fatty Acids (FISH OIL) 1000 MG CAPS Take 1,000 mg by mouth daily.  ? primidone (MYSOLINE) 50 MG tablet TAKE 2 TABLETS(100 MG) BY MOUTH THREE TIMES DAILY WITH MEALS  ? progesterone  (PROMETRIUM) 200 MG capsule Take 300 mg by mouth daily. Compound Rx  ? spironolactone (ALDACTONE) 25 MG tablet Take 25 mg by mouth daily.  ? telmisartan (MICARDIS) 80 MG tablet Take 80 mg by mouth daily.  ? [DISCONTINUED] di

## 2021-12-07 NOTE — Patient Instructions (Signed)
Your history and exam favor the diagnosis of parkinson's disease. I would like to see if we can taper your off the Mysoline (primidone) 50 mg strength: Take 1 pill in AM, 2 pills midday and 2 pills each evening for 1 week, then 1 pill in AM, 1 midday and 2 at night for 1 week, then 1 pill 3 times a day for 1 week. Continue tapering weekly by dropping 1 pill each time, until complete stop.  ?Continue with the levodopa 25-100 mg 1 pill 3 times a day for now.  ?I would like to consider a so called DaT scan in the near future: This is a specialized brain scan designed to help with diagnosis of tremor disorders. A radioactive marker gets injected and the uptake is measured in the brain and compared to normal controls and right side is compared to the left, a change in uptake can help with diagnosis of certain tremor disorders. A brain MRI on the other hand is a brain scan that helps look at the brain structure in more detail overall and look for age-related changes, blood vessel related changes and look for stroke and volume loss which we call atrophy.  ? ?

## 2022-01-04 ENCOUNTER — Telehealth: Payer: Self-pay | Admitting: Neurology

## 2022-01-04 DIAGNOSIS — G20 Parkinson's disease: Secondary | ICD-10-CM

## 2022-01-04 DIAGNOSIS — G20C Parkinsonism, unspecified: Secondary | ICD-10-CM

## 2022-01-04 DIAGNOSIS — R251 Tremor, unspecified: Secondary | ICD-10-CM

## 2022-01-04 MED ORDER — PRIMIDONE 50 MG PO TABS: 50.0000 mg | ORAL_TABLET | Freq: Two times a day (BID) | ORAL | 1 refills | Status: DC

## 2022-01-04 NOTE — Telephone Encounter (Signed)
I don't feel comfortable prescribing high dose primidone and have her on the parkinson's medication. I recommend we pursue the DaT scan next and see if we can get more clarification as to the nature of her tremor disorder(s). She can stay on the mysoline 50 mg bid for now. If she is agreeable to pursuing a DaT scan pls order for tremor and parkinsonism indications.  ? ?

## 2022-01-04 NOTE — Telephone Encounter (Signed)
Pt states since tapering off primidone (MYSOLINE) 50 MG tablet (2x a day now) she has experienced more shaking in her hands, has gotten worse in last few weeks.  ?Pt would like to go back to previous dose of 4x a day.  ?Pt states she is going on vacation and would like a refill of primidone (MYSOLINE) 50 MG tablet for next week at [id number]. ?

## 2022-01-04 NOTE — Telephone Encounter (Signed)
Spoke with the patient and discussed the message from Dr. Rexene Alberts below.  Patient aware Dr. Rexene Alberts is not comfortable prescribing high-dose primidone while also having the patient on the Parkinson's medication.  Patient is amenable to proceeding with a DaTscan.  I let her know we would place the order, obtain insurance approval, and then call her to schedule.  The patient is agreeable to continue with primidone 50 mg twice daily for now.  She does need refills.  I advised we will send this to her pharmacy.  She also mention she will not be back in town until the middle of May.  She is available for phone calls until then to schedule the testing.  She verbalized appreciation for the call and her questions were answered. ? ?Prescription for primidone 50 mg twice daily sent to pharmacy. ?

## 2022-01-04 NOTE — Addendum Note (Signed)
Addended by: Gildardo Griffes on: 01/04/2022 05:33 PM ? ? Modules accepted: Orders ? ?

## 2022-01-04 NOTE — Addendum Note (Signed)
Addended by: Gildardo Griffes on: 01/04/2022 05:35 PM ? ? Modules accepted: Orders ? ?

## 2022-01-05 ENCOUNTER — Telehealth: Payer: Self-pay | Admitting: Neurology

## 2022-01-05 NOTE — Telephone Encounter (Signed)
[facility]: [id number] (exp. 01/05/22 to 02/19/22) order sent to Nuclear medicine. Shanon Brow will reach out to the patient to schedule.  ?

## 2022-01-21 ENCOUNTER — Ambulatory Visit: Payer: Medicare Other | Admitting: Obstetrics and Gynecology

## 2022-01-21 ENCOUNTER — Other Ambulatory Visit: Payer: Self-pay | Admitting: *Deleted

## 2022-01-21 ENCOUNTER — Other Ambulatory Visit (HOSPITAL_COMMUNITY)
Admission: RE | Admit: 2022-01-21 | Discharge: 2022-01-21 | Disposition: A | Discharge status: Home / Self Care | Payer: Medicare Other | Source: Ambulatory Visit | Attending: Obstetrics and Gynecology | Admitting: Obstetrics and Gynecology

## 2022-01-21 ENCOUNTER — Encounter: Payer: Self-pay | Admitting: Obstetrics and Gynecology

## 2022-01-21 VITALS — BP 132/68 | Ht 64.75 in | Wt 133.0 lb

## 2022-01-21 DIAGNOSIS — N882 Stricture and stenosis of cervix uteri: Secondary | ICD-10-CM

## 2022-01-21 DIAGNOSIS — N950 Postmenopausal bleeding: Secondary | ICD-10-CM

## 2022-01-21 DIAGNOSIS — N852 Hypertrophy of uterus: Secondary | ICD-10-CM

## 2022-01-21 NOTE — Patient Instructions (Signed)

## 2022-01-21 NOTE — Progress Notes (Signed)
GYNECOLOGY  VISIT ?  ?HPI: ?71 y.o.   Married White or Caucasian Not Hispanic or Latino  female   ?[id number] with No LMP recorded. Patient is postmenopausal.   ?here for post menopausal bleeding. She states that she has been bleeding lightly for about 6 days now. No pain. She was sexually active the day prior to the bleeding. No pain with intercourse.  ?She is using testosterone and estrogen pellets as well as progesterone 300 mg a day.  ? ?GYNECOLOGIC HISTORY: ?No LMP recorded. Patient is postmenopausal. ?Contraception:PMP ?Menopausal hormone therapy: yes  ?       ?OB History   ? ? Gravida  ?4  ? Para  ?2  ? Term  ?   ? Preterm  ?   ? AB  ?2  ? Living  ?2  ?  ? ? SAB  ?1  ? IAB  ?   ? Ectopic  ?1  ? Multiple  ?   ? Live Births  ?   ?   ?  ?  ?    ? ?Patient Active Problem List  ? Diagnosis Date Noted  ? 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([facility]) 03/30/2021  ? IBS (irritable bowel syndrome) 03/12/2021  ? Elevated troponin   ? RLS (restless legs syndrome) 10/10/2019  ? Tremor 10/10/2019  ? Parkinson's disease ([place]) 07/24/2019  ? Primary osteoarthritis involving multiple joints 11/25/2017  ? Arthralgia of both hands 11/22/2017  ? Esophageal dysphagia 01/13/2017  ? Crohn's disease of ileum without complication ([place]) 04/26/4817  ? Absence of bladder continence 11/15/2014  ? Cephalalgia 11/15/2014  ? Incontinence in female 11/12/2014  ? Adjustment disorder with anxiety 02/17/2013  ? Chest pain 04/12/2012  ? HTN (hypertension) 04/12/2012  ? Hypothyroidism 04/12/2012  ? Hyperlipemia 04/12/2012  ? Thyroid nodule 04/12/2012  ? Hyperlipidemia 04/12/2012  ? Acquired hypothyroidism 04/12/2012  ? Left knee pain 10/14/2011  ? Pes anserine bursitis 10/14/2011  ? Arterial fibromuscular dysplasia ([place]) 08/25/2011  ? Benign renovascular hypertension 06/23/2011  ? ? ?Past Medical History:  ?Diagnosis Date  ? Absence of bladder continence 11/15/2014  ? Acquired hypothyroidism 04/12/2012  ? Adjustment disorder with anxiety 02/17/2013  ? patient  states she has no known diagnosis of this, doesn't take medication for this, states her primary care doesn't even have it noted in their records  ? Arterial fibromuscular dysplasia ([place]) 08/25/2011  ? Arthralgia of both hands 11/22/2017  ? Benign renovascular hypertension 06/23/2011  ? Formatting of this note might be different from the original. right  ? Cephalalgia 11/15/2014  ? Chest pain 04/12/2012  ? Crohn's disease of ileum without complication ([place]) 56/31/4970  ? Elevated troponin   ? Esophageal dysphagia 01/13/2017  ? HTN (hypertension) 04/12/2012  ? Hyperlipemia 04/12/2012  ? Hyperlipidemia 04/12/2012  ? Hypothyroidism   ? IBS (irritable bowel syndrome)   ? Incontinence in female 11/12/2014  ? Left knee pain 10/14/2011  ? Parkinson's disease ([place]) 07/24/2019  ? Pes anserine bursitis 10/14/2011  ? Primary osteoarthritis involving multiple joints 11/25/2017  ? 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([facility])   ? RLS (restless legs syndrome) 10/10/2019  ? Thyroid nodule 04/12/2012  ? Tremor 10/10/2019  ? ? ?Past Surgical History:  ?Procedure Laterality Date  ? APPENDECTOMY    ? AUGMENTATION MAMMAPLASTY    ? saline - 2015  ? CHOLECYSTECTOMY    ? ECTOPIC PREGNANCY SURGERY    ? KNEE SURGERY    ? LEFT HEART CATH AND CORONARY ANGIOGRAPHY N/A 03/11/2021  ? Procedure: LEFT HEART CATH  AND CORONARY ANGIOGRAPHY;  Surgeon: Troy Sine, MD;  Location: [facility];  Service: Cardiovascular;  Laterality: N/A;  ? TONSILLECTOMY    ? ? ?Current Outpatient Medications  ?Medication Sig Dispense Refill  ? amLODipine (NORVASC) 5 MG tablet Take by mouth.    ? carbidopa-levodopa (SINEMET IR) 25-100 MG tablet TAKE 1 TABLET BY MOUTH THREE TIMES DAILY 270 tablet 3  ? celecoxib (CELEBREX) 200 MG capsule Take 200 mg by mouth daily.    ? cholecalciferol (VITAMIN D) 1000 UNITS tablet Take 1,000 Units by mouth daily.    ? dicyclomine (BENTYL) 20 MG tablet Take 20 mg by mouth 4 (four) times daily.    ? ferrous sulfate 325 (65 FE) MG tablet Take 325  mg by mouth daily with breakfast.    ? mesalamine (PENTASA) 500 MG CR capsule Take 1,000 mg by mouth 2 (two) times daily.     ? Multiple Vitamin (MULTIVITAMIN) tablet Take 1 tablet by mouth daily.    ? NONFORMULARY OR COMPOUNDED ITEM Apply 4-6 tablets topically every 4 (four) months. Estrogen and Testosterone pellets put under the skin    ? NP THYROID 90 MG tablet Take 90 mg by mouth every morning.    ? Omega-3 Fatty Acids (FISH OIL) 1000 MG CAPS Take 1,000 mg by mouth daily.    ? primidone (MYSOLINE) 50 MG tablet Take 1 tablet (50 mg total) by mouth in the morning and at bedtime. 60 tablet 1  ? progesterone (PROMETRIUM) 200 MG capsule Take 300 mg by mouth daily. Compound Rx    ? spironolactone (ALDACTONE) 25 MG tablet Take 25 mg by mouth daily.    ? telmisartan (MICARDIS) 80 MG tablet Take 80 mg by mouth daily.    ? amLODipine (NORVASC) 5 MG tablet Take 1 tablet (5 mg total) by mouth daily. (Patient taking differently: Take 5 mg by mouth 2 (two) times daily.) 30 tablet 0  ? ?No current facility-administered medications for this visit.  ?  ? ?ALLERGIES: Zithromax [azithromycin] ? ?Family History  ?Problem Relation Age of Onset  ? Hypertension Father   ? Heart disease Father   ? Hypertension Mother   ? Heart disease Mother   ? ? ?Social History  ? ?Socioeconomic History  ? Marital status: Married  ?  Spouse name: Not on file  ? Number of children: 2  ? Years of education: HS  ? Highest education level: Not on file  ?Occupational History  ? Occupation: Accounting  ?Tobacco Use  ? Smoking status: Never  ? Smokeless tobacco: Never  ?Vaping Use  ? Vaping Use: Never used  ?Substance and Sexual Activity  ? Alcohol use: Yes  ?  Alcohol/week: 2.0 standard drinks  ?  Types: 2 Glasses of wine per week  ?  Comment: One weekly - 2 glasses of wine  ? Drug use: No  ? Sexual activity: Yes  ?  Birth control/protection: Post-menopausal  ?  Comment: 1st intercourse 71 yo-Fewer than 5 partners  ?Other Topics Concern  ? Not on file   ?Social History Narrative  ? Lives at home with her husband.  ? Right-handed.  ? 1 cup/day of tea   ? ?Social Determinants of Health  ? ?Financial Resource Strain: Not on file  ?Food Insecurity: Not on file  ?Transportation Needs: Not on file  ?Physical Activity: Not on file  ?Stress: Not on file  ?Social Connections: Not on file  ?Intimate Partner Violence: Not on file  ? ? ?Review  of Systems  ?All other systems reviewed and are negative. ? ?PHYSICAL EXAMINATION:   ? ?BP 132/68   Ht 5' 4.75" (1.645 m)   Wt 133 lb (60.3 kg)   BMI 22.30 kg/m?     ?General appearance: alert, cooperative and appears stated age ?Neck: no adenopathy, supple, symmetrical, trachea midline and thyroid normal to inspection and palpation ?Abdomen: soft, non-tender; non distended, no masses,  no organomegaly ? ?Pelvic: External genitalia:  no lesions ?             Urethra:  normal appearing urethra with no masses, tenderness or lesions ?             Bartholins and Skenes: normal    ?             Vagina: normal appearing vagina with a small amount of blood noted ?             Cervix: no lesions and not friable, blood noted coming from inside the cervix.  The cervix is stenotic ?             Bimanual Exam:  Uterus:   feels slightly enlarged for PMP woman.  ?             Adnexa:  some fullness noted behind the uterus, not tender ?Her uterus feels retroverted, but with biopsy the cavity is anterior ?              ? ?The risks of endometrial biopsy were reviewed and a consent was obtained.  ?A speculum was placed in the vagina and the cervix was cleansed with betadine. A tenaculum was placed on the cervix. The cervix was dilated with the mini-dilators to a #3 hagar dilator. The uterine evacuator was placed into the endometrial cavity. The uterus sounded to 8 cm. The endometrial biopsy was performed, taking care to get a representative sample, sampling 360 degrees of the uterine cavity. A moderate amount of blood and tissue was obtained. The  tenaculum and speculum were removed. There were no complications.  ? ?Chaperone was present for exam. ? ?1. Postmenopausal bleeding ?Endometrial biopsy ?- US PELVIS TRANSVAGINAL NON-OB (TV ONLY); Future ?- Surgical

## 2022-01-25 ENCOUNTER — Ambulatory Visit: Payer: Medicare Other | Admitting: Obstetrics and Gynecology

## 2022-01-25 LAB — SURGICAL PATHOLOGY

## 2022-01-29 ENCOUNTER — Encounter (HOSPITAL_COMMUNITY)
Admission: RE | Admit: 2022-01-29 | Discharge: 2022-01-29 | Disposition: A | Discharge status: Home / Self Care | Payer: Medicare Other | Source: Ambulatory Visit | Attending: Neurology | Admitting: Neurology

## 2022-01-29 DIAGNOSIS — R251 Tremor, unspecified: Secondary | ICD-10-CM | POA: Diagnosis present

## 2022-01-29 DIAGNOSIS — G20 Parkinson's disease: Secondary | ICD-10-CM | POA: Diagnosis present

## 2022-01-29 MED ORDER — POTASSIUM IODIDE (ANTIDOTE) 130 MG PO TABS
ORAL_TABLET | ORAL | Status: AC
  Filled 2022-01-29: qty 1

## 2022-01-29 MED ORDER — POTASSIUM IODIDE (ANTIDOTE) 130 MG PO TABS: 130.0000 mg | ORAL_TABLET | Freq: Once | ORAL | Status: DC

## 2022-02-02 ENCOUNTER — Telehealth: Payer: Self-pay

## 2022-02-02 MED ORDER — IOFLUPANE I 123 185 MBQ/2.5ML IV SOLN
4.5000 | Freq: Once | INTRAVENOUS | Status: DC | PRN
  Filled 2022-02-02: qty 5

## 2022-02-02 NOTE — Telephone Encounter (Signed)
Patient returned my call. I discussed her DaT scan results and recommendations. She will continue with sinemet and primidone '50mg'$  BID. I offered her an appointment with Dr. Rexene Alberts or an NP but she said that she will call us back to schedule this. Pt verbalized understanding of results and had no further questions or concerns.

## 2022-02-02 NOTE — Telephone Encounter (Signed)
-----   Message from Star Age, MD sent at 02/02/2022 12:34 PM EDT ----- Pls call patient re: recent nuclear medicine DaT scan results:  As discussed, this is a specialized brain scan designed to help with diagnosis of tremor disorders, including parkinsonian disorders. A radioactive marker gets injected and the uptake is measured in the brain and compared to normal controls and right side is compared to the left. A change in uptake can help with diagnosis of certain tremor disorders and narrow down the diagnostic possibilities.  The patient's recent scan indicated abnormal (as in lower) uptake as compared to normal uptake pattern indicating an underlying parkinsonian disorder.  As such, it supports her diagnosis of parkinsonism or Parkinson's disease, she can continue with the Sinemet.  Please have her schedule a follow-up routinely in about 3 months in this clinic, she can see NP as well.

## 2022-02-02 NOTE — Telephone Encounter (Signed)
I called patient to discuss. The phone rings but there is no VM available and the call was disconnected. Will try again later. If patient calls back another day please route to POD 4.

## 2022-02-10 ENCOUNTER — Other Ambulatory Visit (HOSPITAL_COMMUNITY): Payer: Medicare Other

## 2022-03-02 ENCOUNTER — Ambulatory Visit (INDEPENDENT_AMBULATORY_CARE_PROVIDER_SITE_OTHER): Payer: Medicare Other

## 2022-03-02 ENCOUNTER — Encounter: Payer: Self-pay | Admitting: Obstetrics and Gynecology

## 2022-03-02 ENCOUNTER — Ambulatory Visit (INDEPENDENT_AMBULATORY_CARE_PROVIDER_SITE_OTHER): Payer: Medicare Other | Admitting: Obstetrics and Gynecology

## 2022-03-02 VITALS — BP 180/90 | HR 72 | Ht 64.5 in | Wt 132.0 lb

## 2022-03-02 DIAGNOSIS — N950 Postmenopausal bleeding: Secondary | ICD-10-CM

## 2022-03-02 DIAGNOSIS — Z79890 Hormone replacement therapy (postmenopausal): Secondary | ICD-10-CM | POA: Diagnosis not present

## 2022-03-02 DIAGNOSIS — D252 Subserosal leiomyoma of uterus: Secondary | ICD-10-CM | POA: Diagnosis not present

## 2022-03-02 DIAGNOSIS — N852 Hypertrophy of uterus: Secondary | ICD-10-CM

## 2022-03-02 NOTE — Progress Notes (Signed)
GYNECOLOGY  VISIT   HPI: 71 y.o.   Married White or Caucasian Not Hispanic or Latino  female   [phone] with No LMP recorded. Patient is postmenopausal.   here for further evaluation of PMP bleeding. Endometrial biopsy from 01/21/22 returned with  benign endometrium with marked progestational effect.  She is using estrogen/testosterone pellets and is on prometrium 200 mg a day. She is due for more pellets next week. Has been on the pellets for 3-4 years.   GYNECOLOGIC HISTORY: No LMP recorded. Patient is postmenopausal. Contraception:pmp Menopausal hormone therapy: pellets         OB History     Gravida  4   Para  2   Term      Preterm      AB  2   Living  2      SAB  1   IAB      Ectopic  1   Multiple      Live Births                 Patient Active Problem List   Diagnosis Date Noted   Fibromuscular dysplasia of right renal artery ([place]) 05/11/2021   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([facility]) 03/30/2021   IBS (irritable bowel syndrome) 03/12/2021   Elevated troponin    RLS (restless legs syndrome) 10/10/2019   Tremor 10/10/2019   Parkinson's disease ([place]) 07/24/2019   Primary osteoarthritis involving multiple joints 11/25/2017   Arthralgia of both hands 11/22/2017   Esophageal dysphagia 01/13/2017   Crohn's disease of ileum without complication ([place]) 12/87/8676   Absence of bladder continence 11/15/2014   Cephalalgia 11/15/2014   Incontinence in female 11/12/2014   Adjustment disorder with anxiety 02/17/2013   Chest pain 04/12/2012   HTN (hypertension) 04/12/2012   Hypothyroidism 04/12/2012   Hyperlipemia 04/12/2012   Thyroid nodule 04/12/2012   Hyperlipidemia 04/12/2012   Acquired hypothyroidism 04/12/2012   Left knee pain 10/14/2011   Pes anserine bursitis 10/14/2011   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 right   Cephalalgia 11/15/2014   Chest pain 04/12/2012   Crohn's disease of ileum without complication ([place]) 72/05/4708   Elevated troponin    Esophageal dysphagia 01/13/2017   HTN (hypertension) 04/12/2012   Hyperlipemia 04/12/2012   Hyperlipidemia 04/12/2012   Hypothyroidism    IBS (irritable bowel syndrome)    Incontinence in female 11/12/2014   Left knee pain 10/14/2011   Parkinson's disease ([place]) 07/24/2019   Pes anserine bursitis 10/14/2011   Primary osteoarthritis involving multiple joints 11/25/2017   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([facility])    RLS (restless legs syndrome) 10/10/2019   Thyroid nodule 04/12/2012   Tremor 10/10/2019    Past Surgical History:  Procedure Laterality Date   APPENDECTOMY     AUGMENTATION MAMMAPLASTY     saline - 2015   CHOLECYSTECTOMY     ECTOPIC PREGNANCY SURGERY     KNEE SURGERY     LEFT HEART CATH AND  CORONARY ANGIOGRAPHY N/A 03/11/2021   Procedure: LEFT HEART CATH AND CORONARY ANGIOGRAPHY;  Surgeon: Troy Sine, MD;  Location: [facility];  Service: Cardiovascular;  Laterality: N/A;   TONSILLECTOMY      Current Outpatient Medications  Medication Sig Dispense Refill   amLODipine (NORVASC) 5 MG tablet Take by mouth.     carbidopa-levodopa (SINEMET IR) 25-100 MG tablet TAKE 1 TABLET BY MOUTH THREE TIMES DAILY 270 tablet 3   celecoxib (CELEBREX) 200 MG capsule Take 200 mg by mouth daily.     cholecalciferol (VITAMIN D) 1000 UNITS tablet Take 1,000 Units by mouth daily.     dicyclomine (BENTYL) 20 MG tablet  Take 20 mg by mouth 4 (four) times daily.     ferrous sulfate 325 (65 FE) MG tablet Take 325 mg by mouth daily with breakfast.     mesalamine (PENTASA) 500 MG CR capsule Take 1,000 mg by mouth 2 (two) times daily.      Multiple Vitamin (MULTIVITAMIN) tablet Take 1 tablet by mouth daily.     NONFORMULARY OR COMPOUNDED ITEM Apply 4-6 tablets topically every 4 (four) months. Estrogen and Testosterone pellets put under the skin     NP THYROID 90 MG tablet Take 90 mg by mouth every morning.     Omega-3 Fatty Acids (FISH OIL) 1000 MG CAPS Take 1,000 mg by mouth daily.     primidone (MYSOLINE) 50 MG tablet Take 1 tablet (50 mg total) by mouth in the morning and at bedtime. 60 tablet 1   progesterone (PROMETRIUM) 200 MG capsule Take 200 mg by mouth daily. Compound Rx     spironolactone (ALDACTONE) 25 MG tablet Take 25 mg by mouth daily.     telmisartan (MICARDIS) 80 MG tablet Take 80 mg by mouth daily.     amLODipine (NORVASC) 5 MG tablet Take 1 tablet (5 mg total) by mouth daily. (Patient taking differently: Take 5 mg by mouth 2 (two) times daily.) 30 tablet 0   No current facility-administered medications for this visit.     ALLERGIES: Zithromax [azithromycin]  Family History  Problem Relation Age of Onset   Hypertension Father    Heart disease Father    Hypertension Mother    Heart disease Mother     Social History   Socioeconomic History   Marital status: Married    Spouse name: Not on file   Number of children: 2   Years of education: HS   Highest education level: Not on file  Occupational History   Occupation: Accounting  Tobacco Use   Smoking status: Never   Smokeless tobacco: Never  Vaping Use   Vaping Use: Never used  Substance and Sexual Activity   Alcohol use: Yes    Alcohol/week: 2.0 standard drinks of alcohol    Types: 2 Glasses of wine per week    Comment: One weekly - 2 glasses of wine   Drug use: No   Sexual activity: Yes    Birth control/protection:  Post-menopausal    Comment: 1st intercourse 71 yo-Fewer than 5 partners  Other Topics Concern   Not on file  Social History Narrative   Lives at home with her husband.   Right-handed.   1 cup/day of tea    Social Determinants of Health   Financial Resource Strain: Not on file  Food Insecurity: Not on file  Transportation Needs: Not on file  Physical Activity: Not on file  Stress: Not on file  Social Connections: Not on  file  Intimate Partner Violence: Not on file    ROS  PHYSICAL EXAMINATION:    BP (!) 180/90   Pulse 72   Ht 5' 4.5" (1.638 m)   Wt 132 lb (59.9 kg)   SpO2 100%   BMI 22.31 kg/m     General appearance: alert, cooperative and appears stated age  See ultrasound result. Endometrial stipe is 3.2 mm, pedunculated fibroid noted.   1. Postmenopausal bleeding Benign endometrial biopsy with marked progestational effect Ultrasound with thin stripe  2. Subserous leiomyoma of uterus New finding, pedunculated  3. Hormone replacement therapy (HRT) On estrogen and testosterone pellets. Discussed the risks of the pellets with elevated hormone levels that go down over time. Discussed the option of transdermal estrogen. She prefers to continue with her current HRT regimen, will reach out if she changes her mind.  Call with further bleeding.

## 2022-03-09 ENCOUNTER — Ambulatory Visit: Payer: Medicare Other | Admitting: Neurology

## 2022-03-17 ENCOUNTER — Other Ambulatory Visit: Payer: Self-pay | Admitting: *Deleted

## 2022-03-17 MED ORDER — CARBIDOPA-LEVODOPA 25-100 MG PO TABS: ORAL_TABLET | ORAL | 0 refills | Status: DC

## 2022-04-15 ENCOUNTER — Telehealth: Payer: Self-pay | Admitting: Neurology

## 2022-04-15 NOTE — Addendum Note (Signed)
Addended by: Gildardo Griffes on: 04/15/2022 04:02 PM   Modules accepted: Orders

## 2022-04-15 NOTE — Telephone Encounter (Signed)
Pt is asking if something can be called in for her RLS as a result of her Parkinson's disease , please call to discuss.

## 2022-04-15 NOTE — Telephone Encounter (Signed)
Spoke with patient and scheduled her for an appt with Dr Rexene Alberts for next Tues 8/8 at 8:45 AM for RLS discussion. Pt's questions were answered. Pt will setup mychart as she prefers not to come into the office. Sent link to join [employer]. Pt confirmed she tapered off the Primidone as previously discussed. Pt verbalized appreciation for the call.

## 2022-04-20 ENCOUNTER — Telehealth: Payer: Self-pay | Admitting: Neurology

## 2022-04-20 ENCOUNTER — Telehealth: Payer: Medicare Other | Admitting: Neurology

## 2022-04-20 NOTE — Telephone Encounter (Signed)
Pt did not have a [facility] MyChart set up.  Pt mistakenly was on her [facility]

## 2022-04-21 ENCOUNTER — Encounter: Payer: Self-pay | Admitting: Neurology

## 2022-04-21 ENCOUNTER — Ambulatory Visit (INDEPENDENT_AMBULATORY_CARE_PROVIDER_SITE_OTHER): Payer: Medicare Other | Admitting: Neurology

## 2022-04-21 VITALS — BP 152/88 | HR 79 | Ht 65.0 in | Wt 127.0 lb

## 2022-04-21 DIAGNOSIS — G20 Parkinson's disease: Secondary | ICD-10-CM | POA: Diagnosis not present

## 2022-04-21 DIAGNOSIS — E079 Disorder of thyroid, unspecified: Secondary | ICD-10-CM

## 2022-04-21 DIAGNOSIS — G2581 Restless legs syndrome: Secondary | ICD-10-CM | POA: Diagnosis not present

## 2022-04-21 DIAGNOSIS — Z8639 Personal history of other endocrine, nutritional and metabolic disease: Secondary | ICD-10-CM

## 2022-04-21 DIAGNOSIS — Z820 Family history of epilepsy and other diseases of the nervous system: Secondary | ICD-10-CM

## 2022-04-21 DIAGNOSIS — G479 Sleep disorder, unspecified: Secondary | ICD-10-CM

## 2022-04-21 MED ORDER — CARBIDOPA-LEVODOPA 25-100 MG PO TABS
1.0000 | ORAL_TABLET | Freq: Four times a day (QID) | ORAL | 1 refills | Status: DC
Start: 2022-04-21 — End: 2022-11-29

## 2022-04-21 NOTE — Patient Instructions (Addendum)
It was nice to see you again today.  We will do some blood work today to look for treatable causes for restless leg syndrome.  Your TSH which is a marker for thyroid function has not been normal in the recent past with fluctuation noted.  Last check from what I can see was in March 2023.  You are taking iron supplements long-term.  We will check for iron deficiency or iron overload.  We will also recheck your B12 level.   Please be advised never to take anybody else's prescription medicines, even if these are your daughter's prescriptions, you may run the risk of medication side effects and interactions if these prescriptions are not approved by a medical professional that you routinely see.  Please also avoid adjusting your own prescription medicines on your own without checking with your prescriber first.  If you increase your prescription medicine, you will be at risk of running out of your prescription ahead of time, before your refill is due, which may delay your Rx renewal. Suddenly running out of or abruptly stopping certain prescription medicines can be detrimental.  We will call you with your blood test results.  We may consider adding a medication for restless legs called pramipexole next. Since you have increased your levodopa to 1 pill 4 times a day and tolerate it, I will go ahead and adjust your prescription.

## 2022-04-21 NOTE — Progress Notes (Signed)
Subjective:    Patient ID: Melinda Lane is a 71 y.o. female.  HPI    Melinda Age, MD, PhD [facility] [address] [place], [place] [address]  Melinda Lane is a 71 year old right-handed woman with an underlying medical history of hypothyroidism, Crohn's disease, hypertension, hyperlipidemia, irritable bowel syndrome, arthritis, Parkinson's disease and anxiety, who presents for a new problem visit to discuss restless leg symptoms.  The patient is unaccompanied today. She missed a VV appointment on 04/20/2022.  I first met her on 12/07/2021, at which time I recommended that she taper off Mysoline as she had features of Parkinson's disease.  She was encouraged to continue with Sinemet 3 times a day and advised to proceed with a DaTscan.  She had an interim DaTscan on 01/29/2022 which showed:  IMPRESSION: Significant decreased striatal Ioflupane activity as above. This pattern can be seen in Parkinsonian syndromes.   Of note, DaTSCAN is not diagnostic of Parkinsonian syndromes, which remains a clinical diagnosis. DaTscan is an adjuvant test to aid in the clinical diagnosis of Parkinsonian syndromes.    She was notified of the test results and advised to continue with Sinemet.  Today, 04/21/2022: She reports that she has had symptoms of restless legs for the past 3+ weeks.  Symptoms have included restlessness at night, difficulty initiating and maintaining sleep, poor sleep quality, having an urge to walk around.  She is followed for her thyroid function by [facility].  She does not have a history of anemia or perhaps has a history of iron deficiency as she recalls but she has been on over-the-counter iron supplementation for years.  In the past week she has increased her levodopa on her own to 4 pills a day, first dose around 9:30 AM, second dose between 1:30 PM and 2 PM, third dose around 6 PM and 6:30 PM and a fourth dose between 11 and 11:30 PM.   She goes to bed between 11:30 PM and midnight.  She has also taken for the past week her daughter's prescription medicine including hydroxyzine at night and gabapentin 300 mg at night, she is strongly advised never to take anybody else's prescription medicines.  She is also discouraged from increasing her prescription medicines on her own without checking with the prescriber first.  She will otherwise be at risk of running out of her prescription ahead of time. She recalls that her mother had restless leg syndrome and took pramipexole.  She had a recent CBC with differential through [facility] on 03/17/2022 and I reviewed the results: WBC was 9.2, RBC 4.57, hemoglobin 14.9, hematocrit slightly above normal at 45.2, MCV above normal at 98.7, MCHC borderline below normal at 39.9. Her B12 level on 10/29/2021 was 522. Last TSH checked that I can see in her electronic chart was from 11/24/2021 and was below normal at 0.30. On 05/11/2021 her TSH was below normal at 0.192. On 03/12/2021 it was slightly below normal at 0.34.  The patient's allergies, current medications, family history, past medical history, past social history, past surgical history and problem list were reviewed and updated as appropriate.   Previously:   12/07/21 ([facility]): She has previously seen Dr. Jannifer Lane in this office and was last seen by him on 04/14/2021.  I reviewed the note and copied the note below for reference.  She has been on low-dose Sinemet as well as Mysoline for her hand tremor.   She had previously seen Dr. Laurena Lane for her  tremor.  I reviewed his office note through [facility] on 01/30/2020.  At that time she reported that the Sinemet was not helping her tremor.  She was advised to stop Sinemet at the time.    She was also evaluated by Dr. Krista Lane office in March 2016 for headaches.  I reviewed her office note from 11/15/2014.   She had a head CT without contrast on 11/12/2014 and I reviewed the results: IMPRESSION: No  intracranial mass, hemorrhage, or focal gray -white compartment lesion/acute appearing infarct. Stable pineal calcification. Stability of this calcification over several years is consistent with benign etiology.     04/14/21 (Dr. Jannifer Lane): << Melinda Lane is a 72 year old right-handed white female with a history of a right upper extremity tremor.  The patient has been felt to have Parkinson's disease, she was placed on Sinemet taking 25/100 mg tablets, 1 tablet 3 times daily.  The patient has also been treated through Melinda Lane in the past, he has treated her for essential tremor.  She is now on Mysoline taking 100 mg 3 times daily, she is tolerating the medication well and she does believe that this has helped her tremor.  She has noted some ongoing problems with micrographia and some recent troubles with getting up off the floor if she is playing with her grandchildren.  She has not had any falls.  She denies any leg tremor.  She has not had any problems with speech or swallowing.  She returns to this office for further evaluation.  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 right   Cephalalgia 11/15/2014   Chest pain 04/12/2012   Crohn's disease of ileum without complication ([place]) 99/24/2683   Elevated troponin    Esophageal dysphagia 01/13/2017   HTN (hypertension) 04/12/2012   Hyperlipemia 04/12/2012   Hyperlipidemia  04/12/2012   Hypothyroidism    IBS (irritable bowel syndrome)    Incontinence in female 11/12/2014   Left knee pain 10/14/2011   Parkinson's disease ([place]) 07/24/2019   Pes anserine bursitis 10/14/2011   Primary osteoarthritis involving multiple joints 11/25/2017   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(H[facility])    RLS (restless legs syndrome) 10/10/2019   Thyroid nodule 04/12/2012   Tremor 10/10/2019    Her Past Surgical History Is Significant For: Past Surgical History:  Procedure Laterality Date   APPENDECTOMY     AUGMENTATION MAMMAPLASTY     saline - 2015   CHOLECYSTECTOMY     ECTOPIC PREGNANCY SURGERY     KNEE SURGERY     LEFT HEART CATH AND CORONARY ANGIOGRAPHY N/A 03/11/2021   Procedure: LEFT HEART CATH AND CORONARY ANGIOGRAPHY;  Surgeon: Troy Sine, MD;  Location: [facility];  Service: Cardiovascular;  Laterality: N/A;   TONSILLECTOMY      Her Family History Is Significant For: Family History  Problem Relation Lane of Onset   Hypertension Mother    Heart disease Mother  Restless legs syndrome Mother    Hypertension Father    Heart disease Father     Her Social History Is Significant For: Social History   Socioeconomic History   Marital status: Married    Spouse name: Not on file   Number of children: 2   Years of education: HS   Highest education level: Not on file  Occupational History   Occupation: Accounting  Tobacco Use   Smoking status: Never   Smokeless tobacco: Never  Vaping Use   Vaping Use: Never used  Substance and Sexual Activity   Alcohol use: Yes    Alcohol/week: 2.0 standard drinks of alcohol    Types: 2 Glasses of wine per week    Comment: One weekly - 2 glasses of wine   Drug use: No   Sexual activity: Yes    Birth control/protection: Post-menopausal    Comment: 1st intercourse 71 yo-Fewer than 5 partners  Other Topics Concern   Not on file  Social History Narrative   Lives at home with her husband.   Right-handed.   1 cup/day of  tea    Social Determinants of Health   Financial Resource Strain: Not on file  Food Insecurity: Not on file  Transportation Needs: Not on file  Physical Activity: Not on file  Stress: Not on file  Social Connections: Not on file    Her Allergies Are:  Allergies  Allergen Reactions   Zithromax [Azithromycin] Rash  :   Her Current Medications Are:  Outpatient Encounter Medications as of 04/21/2022  Medication Sig   amLODipine (NORVASC) 5 MG tablet Take by mouth.   carbidopa-levodopa (SINEMET IR) 25-100 MG tablet TAKE 1 TABLET BY MOUTH THREE TIMES DAILY   celecoxib (CELEBREX) 200 MG capsule Take 200 mg by mouth daily.   cholecalciferol (VITAMIN D) 1000 UNITS tablet Take 1,000 Units by mouth daily.   dicyclomine (BENTYL) 20 MG tablet Take 20 mg by mouth 4 (four) times daily.   ferrous sulfate 325 (65 FE) MG tablet Take 325 mg by mouth daily with breakfast.   mesalamine (PENTASA) 500 MG CR capsule Take 1,000 mg by mouth 2 (two) times daily.    Multiple Vitamin (MULTIVITAMIN) tablet Take 1 tablet by mouth daily.   NONFORMULARY OR COMPOUNDED ITEM Apply 4-6 tablets topically every 4 (four) months. Estrogen and Testosterone pellets put under the skin   NP THYROID 90 MG tablet Take 90 mg by mouth every morning.   Omega-3 Fatty Acids (FISH OIL) 1000 MG CAPS Take 1,000 mg by mouth daily.   progesterone (PROMETRIUM) 200 MG capsule Take 200 mg by mouth daily. Compound Rx   spironolactone (ALDACTONE) 25 MG tablet Take 25 mg by mouth daily.   telmisartan (MICARDIS) 80 MG tablet Take 80 mg by mouth daily.   amLODipine (NORVASC) 5 MG tablet Take 1 tablet (5 mg total) by mouth daily. (Patient taking differently: Take 5 mg by mouth 2 (two) times daily.)   No facility-administered encounter medications on file as of 04/21/2022.  :   Review of Systems:  Out of a complete 14 point review of systems, all are reviewed and negative with the exception of these symptoms as listed below:  Review of  Systems  Neurological:        Pt here for RLS consult   Pt states her RLS in both legs Pt states pain is worse at night when she lays down . Pt states unable to sleep up all night  Objective:  Neurological Exam  Physical Exam Physical Examination:   Vitals:   04/21/22 0945  BP: (!) 152/88  Pulse: 79    General Examination: The patient is a very pleasant 71 y.o. female in no acute distress. She appears well-developed and well-nourished and well groomed.   HEENT: Normocephalic, atraumatic, pupils are equal, round and reactive to light, extraocular tracking well-preserved, left eye contact lens in place.  Mild decrease in eye blink rate, mild facial masking. Mild nuchal rigidity noted.  Speech is clear without dysarthria, hypophonia or voice tremor, hearing grossly intact.  Airway examination reveals benign findings.  Tongue protrudes centrally and palate elevates symmetrically, no carotid bruits.     Chest: Clear to auscultation without wheezing, rhonchi or crackles noted.   Heart: K5+L9+7, mild systolic murmur noted, not new per patient.     Abdomen: Soft, non-tender and non-distended.   Extremities: There is no pitting edema in the distal lower extremities bilaterally.    Skin: Warm and dry without trophic changes noted.    Musculoskeletal: exam reveals no obvious joint deformities.    Neurologically:  Mental status: The patient is awake, alert and oriented in all 4 spheres. Her immediate and remote memory, attention, language skills and fund of knowledge are appropriate. There is no evidence of aphasia, agnosia, apraxia or anomia. Speech is clear with normal prosody and enunciation. Thought process is linear. Mood is normal and affect is normal.  Cranial nerves II - XII are as described above under HEENT exam.  Motor exam: Normal bulk, and normal strength noted, mild increase in tone in right upper extremity, intermittent mild resting tremor in the right upper extremity noted,  no other resting tremor.  Fine motor skills and coordination: Mild to moderate difficulty with lateralization more so on the right.  She has no obvious postural or action tremor in the upper extremities bilaterally.   Cerebellar testing: No dysmetria or intention tremor. There is no truncal or gait ataxia.  Sensory exam: intact to light touch in the upper and lower extremities.  Gait, station and balance: She stands without difficulty, posture is stooped for Lane, she walks with a decrease in arm swing on the right more than left, turns well, balance fairly well-preserved.   Assessment and Plan:    In summary, Melinda Lane is a 71 year old female with an underlying medical history of hypothyroidism, Crohn's disease, hypertension, hyperlipidemia, irritable bowel syndrome, arthritis, Parkinson's disease, and anxiety, who presents for evaluation of her RLS symptoms of a few weeks duration.  She does not have a longstanding history of restless leg symptoms.  She has not been sleeping well.  She is today advised not to use anybody else's prescription medication even if these are her daughter's prescriptions, she may be at risk for medication interactions and side effects without knowing.  She is furthermore discouraged from increasing or abruptly stopping any of her own prescription medicines as this can cause rebound or side effects or withdrawal symptoms.  She has been taking iron supplements for years, she is not known to have anemia.  She has been on thyroid medication and has had a fluctuation in her TSH.  She is followed by [facility] for her thyroid function and thyroid medicine.  I would like to do some blood work today including TSH, B12, iron studies, ferritin, CBC.  We will call her with the results.  She is advised to continue with levodopa 1 pill 4 times a day as she is tolerating it.  I have adjusted her prescription in that regard.  We may consider pramipexole for symptomatic treatment of her  RLS symptoms, pending blood test results.  She is advised to follow-up routinely in 3 to 4 months, sooner if needed.  We will update her regarding her blood test results by phone call.  Of note, she has tapered off her Mysoline. She is benefiting from the levodopa, 1 pill tid. She had a DaTscan on 01/29/22, which showed significantly decreased striatal Ioflupane activity. I answered all her questions today and she was in agreement.  I spent 30 minutes in total face-to-face time and in reviewing records during pre-charting, more than 50% of which was spent in counseling and coordination of care, reviewing test results, reviewing medications and treatment regimen and/or in discussing or reviewing the diagnosis of RLS, PD, the prognosis and treatment options. Pertinent laboratory and imaging test results that were available during this visit with the patient were reviewed by me and considered in my medical decision making (see chart for details).

## 2022-04-22 ENCOUNTER — Telehealth: Payer: Self-pay | Admitting: *Deleted

## 2022-04-22 LAB — IRON AND TIBC
Iron Saturation: 51 % (ref 15–55)
Iron: 134 ug/dL (ref 27–139)
Total Iron Binding Capacity: 264 ug/dL (ref 250–450)
UIBC: 130 ug/dL (ref 118–369)

## 2022-04-22 LAB — CBC WITH DIFFERENTIAL/PLATELET
Basophils Absolute: 0 10*3/uL (ref 0.0–0.2)
Basos: 0 %
EOS (ABSOLUTE): 0.1 10*3/uL (ref 0.0–0.4)
Eos: 1 %
Hematocrit: 42.5 % (ref 34.0–46.6)
Hemoglobin: 14.8 g/dL (ref 11.1–15.9)
Immature Grans (Abs): 0 10*3/uL (ref 0.0–0.1)
Immature Granulocytes: 0 %
Lymphocytes Absolute: 1.2 10*3/uL (ref 0.7–3.1)
Lymphs: 16 %
MCH: 32.5 pg (ref 26.6–33.0)
MCHC: 34.8 g/dL (ref 31.5–35.7)
MCV: 93 fL (ref 79–97)
Monocytes Absolute: 0.7 10*3/uL (ref 0.1–0.9)
Monocytes: 9 %
Neutrophils Absolute: 5.5 10*3/uL (ref 1.4–7.0)
Neutrophils: 74 %
Platelets: 361 10*3/uL (ref 150–450)
RBC: 4.56 x10E6/uL (ref 3.77–5.28)
RDW: 12.5 % (ref 11.7–15.4)
WBC: 7.5 10*3/uL (ref 3.4–10.8)

## 2022-04-22 LAB — TSH: TSH: 0.04 u[IU]/mL — ABNORMAL LOW (ref 0.450–4.500)

## 2022-04-22 LAB — B12 AND FOLATE PANEL
Folate: >20 ng/mL (ref >3.0)
Vitamin B-12: 552 pg/mL (ref 232–1245)

## 2022-04-22 LAB — FERRITIN: Ferritin: 96 ng/mL (ref 15–150)

## 2022-04-22 NOTE — Telephone Encounter (Signed)
Spoke to patient gave lab work results .Gave Dr Tori Milks recommendation. Informed patient sent lab work results to her PCP and she should call and make appointment with PCP tomorrow. Pt wanted to know if Dr Rexene Alberts was going to give her something to help  her sleep at night. Made patient aware Per Dr.Athar  is not going to change medication for RLS until Thyroid dysfuncation is addressed by PCP . Pt expressed understanding and thanked me for calling.

## 2022-04-22 NOTE — Telephone Encounter (Signed)
-----   Message from Star Age, MD sent at 04/22/2022  7:18 AM EDT ----- Please advise patient that her thyroid screening test called TSH was abnormal, quite low, indicating thyroid overfunction potentially.  She should make an appointment with her primary care physician to discuss management and potential evaluation through an endocrinologist if he suggests.  Thyroid dysfunction can tie in with restless leg symptoms.  We should address her thyroid dysfunction first before starting any new medication for restless leg syndrome.  Other labs were benign, no evidence of anemia or iron deficiency.

## 2022-08-03 ENCOUNTER — Ambulatory Visit: Payer: Medicare Other | Admitting: Neurology

## 2022-08-23 ENCOUNTER — Telehealth: Payer: Self-pay | Admitting: Neurology

## 2022-08-23 NOTE — Telephone Encounter (Signed)
Pt is requesting a refill for carbidopa-levodopa (SINEMET IR) 25-100 MG tablet .  Pharmacy: [phone]

## 2022-08-23 NOTE — Telephone Encounter (Signed)
I called pharmacy and refill was available they will get it ready.

## 2022-10-14 ENCOUNTER — Ambulatory Visit: Payer: Medicare Other | Admitting: Neurology

## 2022-11-25 ENCOUNTER — Ambulatory Visit: Payer: Medicare Other | Admitting: Neurology

## 2022-11-29 ENCOUNTER — Telehealth: Payer: Self-pay | Admitting: Neurology

## 2022-11-29 MED ORDER — CARBIDOPA-LEVODOPA 25-100 MG PO TABS: 1.0000 | ORAL_TABLET | Freq: Four times a day (QID) | ORAL | 0 refills | Status: DC

## 2022-11-29 NOTE — Telephone Encounter (Signed)
One 30-month supply sent to pharmacy.

## 2022-11-29 NOTE — Telephone Encounter (Signed)
Pt is requesting a refill for carbidopa-levodopa (SINEMET IR) 25-100 MG tablet.  Pharmacy: [place] [phone]

## 2022-11-29 NOTE — Addendum Note (Signed)
Addended by: Gildardo Griffes on: 11/29/2022 02:51 PM   Modules accepted: Orders

## 2023-02-28 ENCOUNTER — Other Ambulatory Visit: Payer: Self-pay | Admitting: Neurology

## 2023-03-10 ENCOUNTER — Telehealth: Payer: Self-pay | Admitting: Neurology

## 2023-03-10 ENCOUNTER — Ambulatory Visit: Payer: Medicare Other | Admitting: Neurology

## 2023-03-10 NOTE — Telephone Encounter (Signed)
LVM and sent text msg informing pt of need to reschedule 03/10/23 appt - MD Out

## 2023-03-21 ENCOUNTER — Telehealth: Payer: Self-pay | Admitting: Neurology

## 2023-03-21 NOTE — Telephone Encounter (Signed)
Pt stated she is following up on medical clearance form. Stated it's from Dr. Ranell Patrick office.

## 2023-03-21 NOTE — Telephone Encounter (Addendum)
Clearance form for right TKA signed by Dr Frances Furbish. Pt has been cleared from a Neurology-Parkinson's perspective with moderate risk. Form has been faxed back to [facility] [phone] along with last office note. Received a receipt of confirmation.  Copy of clearance form sent to medical records for scanning.

## 2023-05-14 ENCOUNTER — Other Ambulatory Visit: Payer: Self-pay | Admitting: Neurology

## 2023-05-18 ENCOUNTER — Other Ambulatory Visit: Payer: Self-pay | Admitting: *Deleted

## 2023-05-18 NOTE — Progress Notes (Signed)
error 

## 2023-06-04 IMAGING — CT CT ANGIO CHEST-ABD-PELV FOR DISSECTION W/ AND WO/W CM
2 of 9 series · 13 of 46 positions shown, 15 images · IV contrast (Omnipaque)
Comparison: None.

CLINICAL DATA: Chest pain, back pain, hypertension, dyspnea

EXAM:
CT ANGIOGRAPHY CHEST, ABDOMEN AND PELVIS
TECHNIQUE: Non-contrast CT of the chest was initially obtained.

[Series 6: axial arterial · axial · arterial · 0.98mm/px · z∈[-231,+378]mm · 10 of 227 slices shown, 12 images]
[im 12/227  soft-tissue]
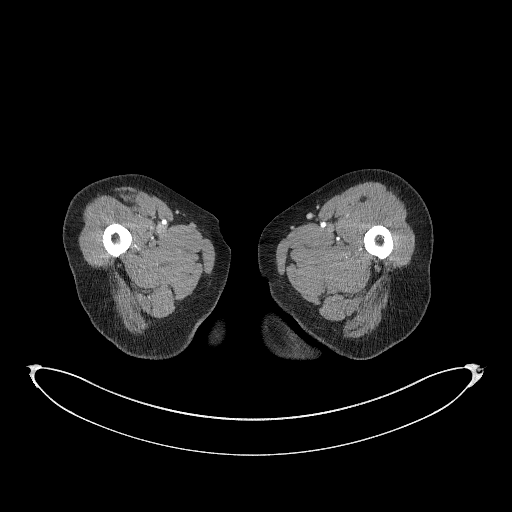
[im 12/227  bone]
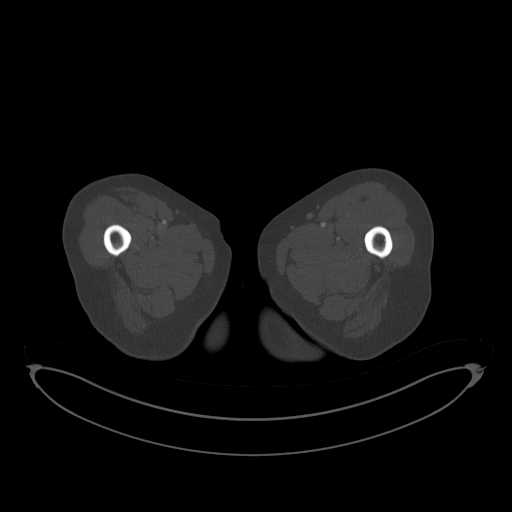
[im 36/227  soft-tissue]
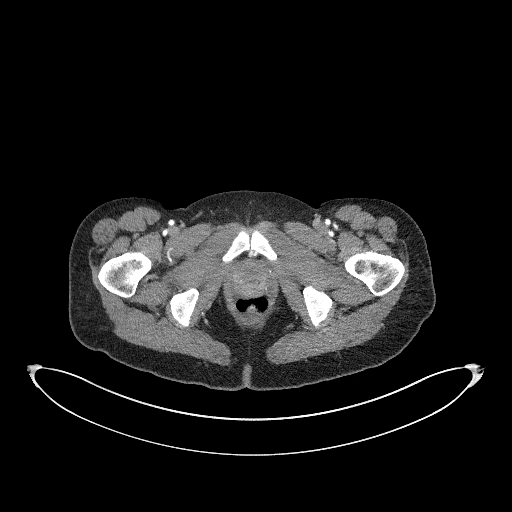
[im 60/227  soft-tissue]
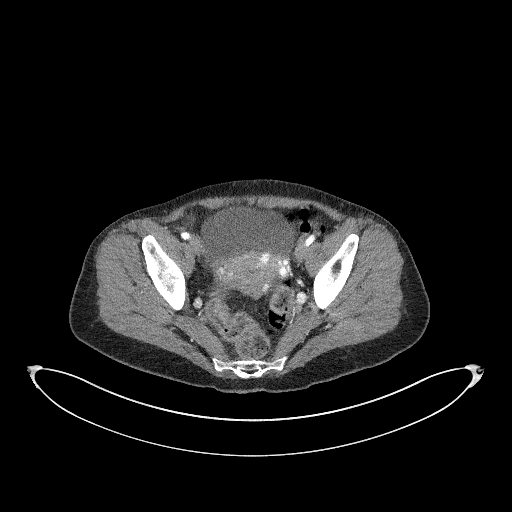
[im 84/227  soft-tissue]
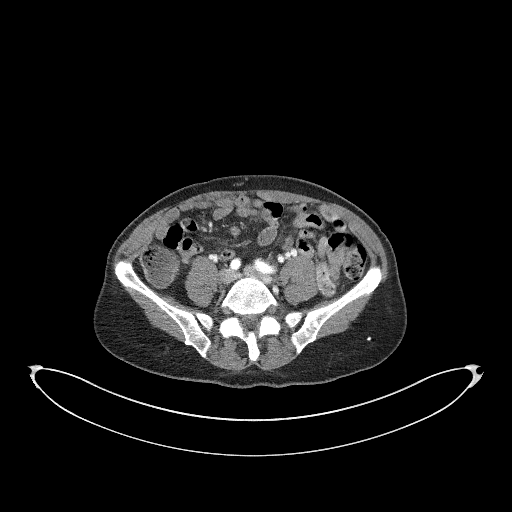
[im 108/227  soft-tissue]
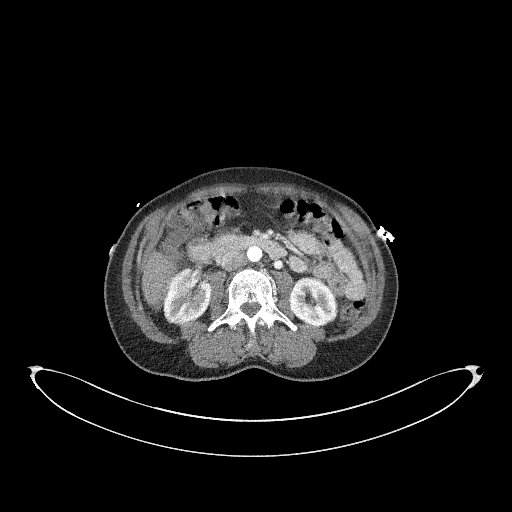
[im 119/227  soft-tissue]
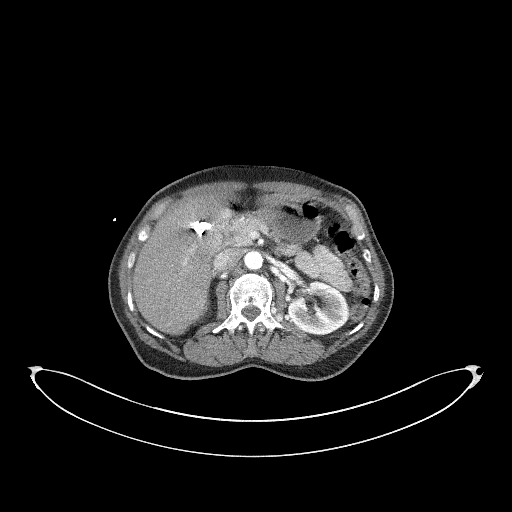
[im 143/227  soft-tissue]
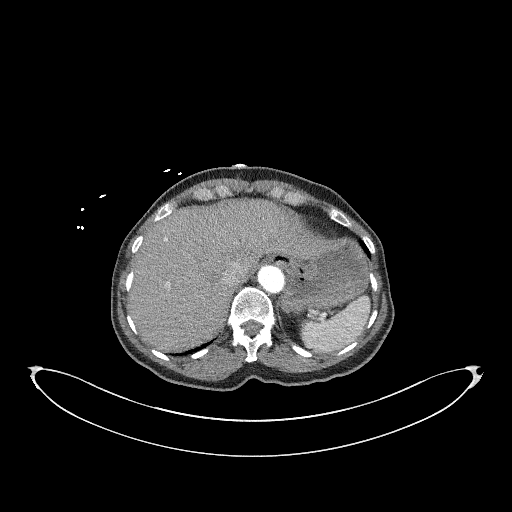
[im 167/227  soft-tissue]
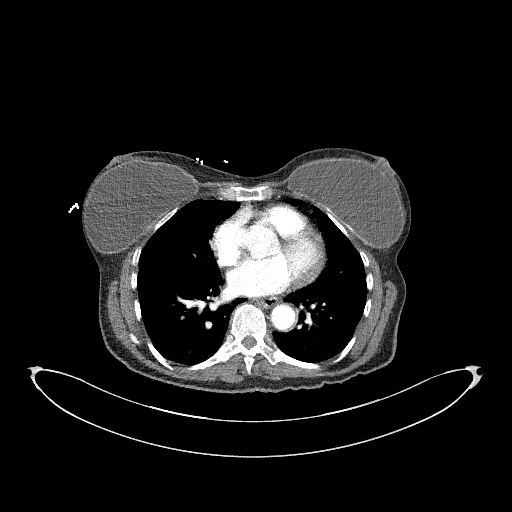
[im 191/227  soft-tissue]
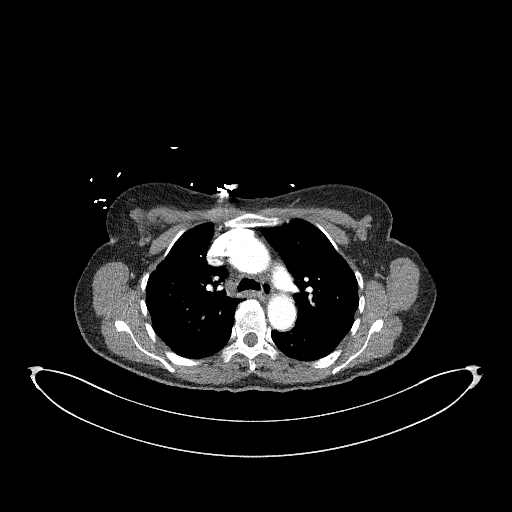
[im 191/227  bone]
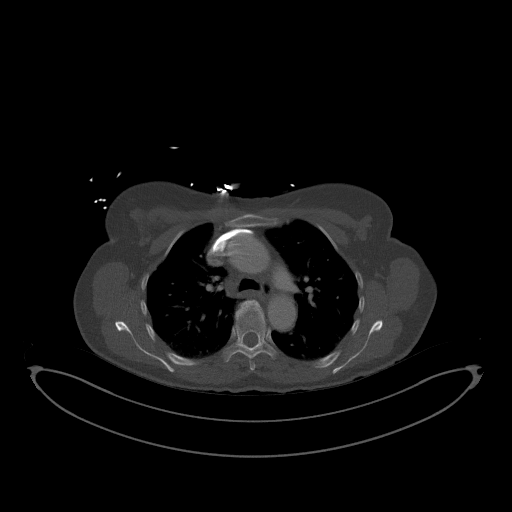
[im 215/227  soft-tissue]
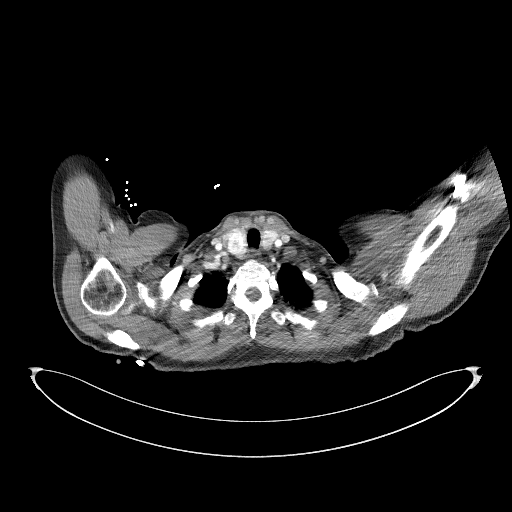

[Series 9: coronals · coronal · 0.87mm/px · 3 of 168 slices shown]
[im 42/168  soft-tissue]
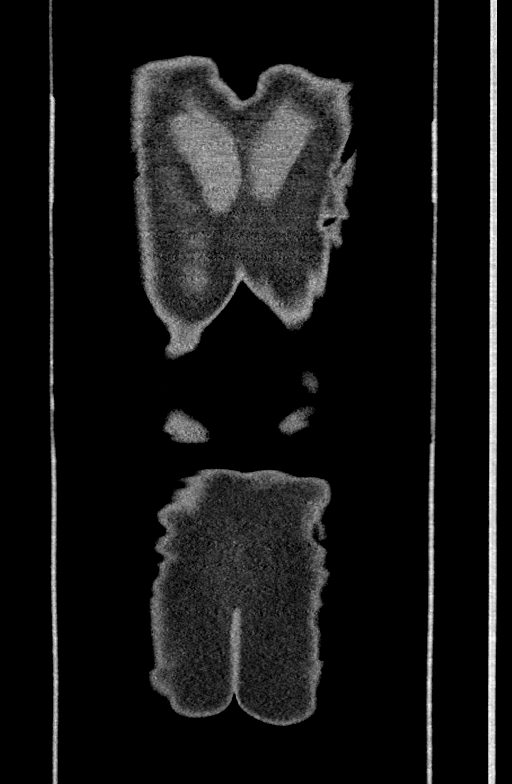
[im 84/168  soft-tissue]
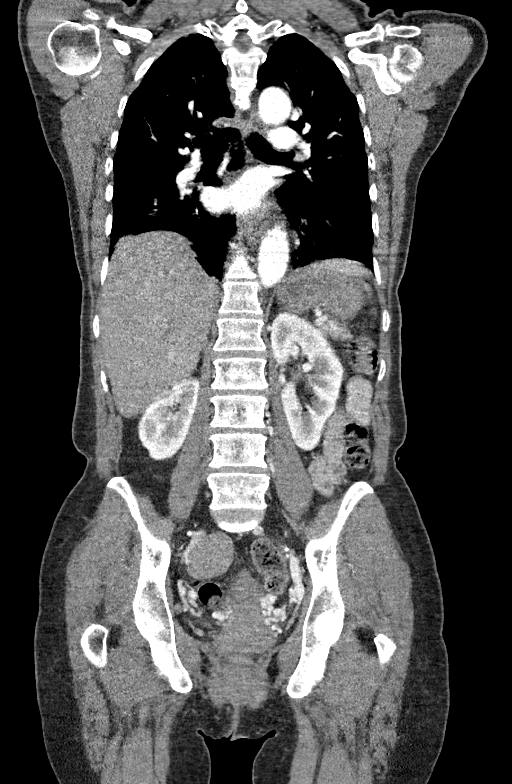
[im 126/168  soft-tissue]
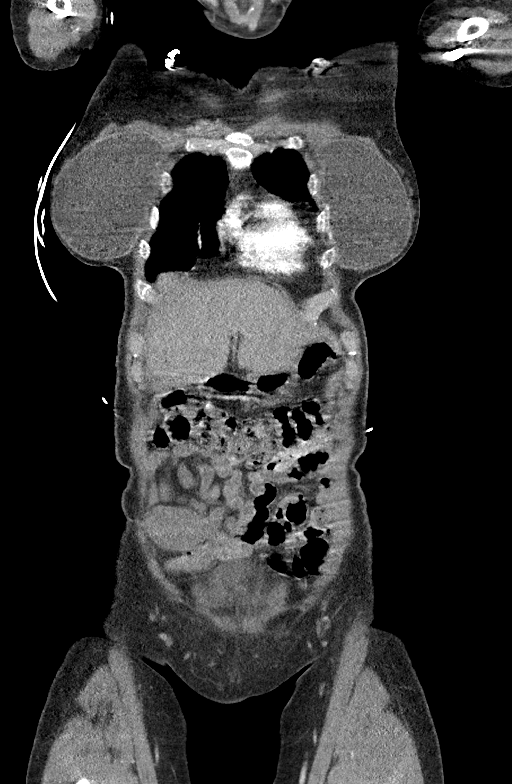

[13 of 46 positions shown; findings below may reference images not displayed]

Multidetector CT imaging through the chest, abdomen and pelvis was
performed using the standard protocol during bolus administration of
intravenous contrast. Multiplanar reconstructed images and MIPs were
obtained and reviewed to evaluate the vascular anatomy.

CONTRAST:  100mL OMNIPAQUE IOHEXOL 350 MG/ML SOLN
FINDINGS: CTA CHEST FINDINGS

Cardiovascular: The thoracic aorta is of normal caliber. No
intramural hematoma, dissection, or aneurysm. Minimal
atherosclerotic calcification within the descending thoracic aorta.
Arch vasculature demonstrates normal anatomic configuration and is
widely patent proximally.

No significant coronary artery calcification. Global cardiac size
within normal limits. No pericardial effusion. The central pulmonary
arteries are of normal caliber.

Mediastinum/Nodes: Multinodular thyroid with progressive enlargement
of dominant right thyroid nodule now measuring 2.1 cm in greatest
dimension. No pathologic thoracic adenopathy. Esophagus
unremarkable.

Lungs/Pleura: Lungs are clear. No pleural effusion or pneumothorax.

Musculoskeletal: Bilateral breast implants are noted. No acute bone
abnormality.

Review of the MIP images confirms the above findings.

CTA ABDOMEN AND PELVIS FINDINGS

VASCULAR

Aorta: Minimal atherosclerotic calcification. Normal caliber. No
aneurysm or dissection. No periaortic inflammatory change.

Celiac: Less than 50% stenosis of the a origin of the celiac axis.
Distally widely patent. No aneurysm or dissection.

SMA: Widely patent.  No aneurysm or dissection.

Renals: Single renal arteries are widely patent bilaterally. Beaded
appearance of the mid segment of the right renal arteries in keeping
with changes of fibromuscular dysplasia. No aneurysm.

IMA: Widely patent

Inflow: Widely patent.  No aneurysm or dissection.

Veins: The ovarian veins are dilated, left greater than right, with
extensive adnexal varices noted bilaterally suggesting changes of
ovarian vein reflux and pelvic venous insufficiency. There is
narrowing of the left renal vein as it crosses the aorta, however,
and this may, in part, represent collateralization of the left renal
vein secondary to a central stenosis.

Review of the MIP images confirms the above findings.

NON-VASCULAR

Hepatobiliary: No focal liver abnormality is seen. Status post
cholecystectomy. No biliary dilatation.

Pancreas: Unremarkable

Spleen: Unremarkable

Adrenals/Urinary Tract: The adrenal glands are unremarkable. The
kidneys are normal in size and position. Simple exophytic cortical
cyst arises from the upper pole of the left kidney. The kidneys are
otherwise unremarkable. The bladder is unremarkable.

Stomach/Bowel: The stomach, small bowel, and large bowel are
unremarkable. No free intraperitoneal gas or fluid.

Lymphatic: No pathologic adenopathy within the abdomen and pelvis.

Reproductive: Aside from adnexal varices, the pelvic organs are
unremarkable.

Other: No abdominal wall hernia.  Rectum is unremarkable

Musculoskeletal: No acute bone abnormality. No lytic or blastic bone
lesion.

Review of the MIP images confirms the above findings.
IMPRESSION: No evidence of thoracoabdominal aortic aneurysm or dissection.
Minimal atherosclerotic plaque.

No acute intrathoracic or intra-abdominal pathology identified.

Enlarging dominant right thyroid nodule. Recommend thyroid US (ref:
[HOSPITAL]. [DATE]): 143-50).

Beaded appearance of the a right renal artery in keeping with
changes of fibromuscular dysplasia.

Marked dilation of the ovarian veins bilaterally, with extensive
bilateral adnexal varices. Narrowing of the left renal vein
centrally. These findings likely reflect changes of ovarian vein
reflux and pelvic venous insufficiency, though a component of renal
vein stenosis and collateralization through the left gonadal vein
may contribute. This appears unchanged from prior examination.

Aortic Atherosclerosis ([id number]).

## 2023-06-04 IMAGING — DX DG CHEST 2V
2 series · 2 of 2 positions shown · non-contrast
Comparison: 04/11/2012

CLINICAL DATA: Chest pain

EXAM:
CHEST - 2 VIEW

[chest pa]
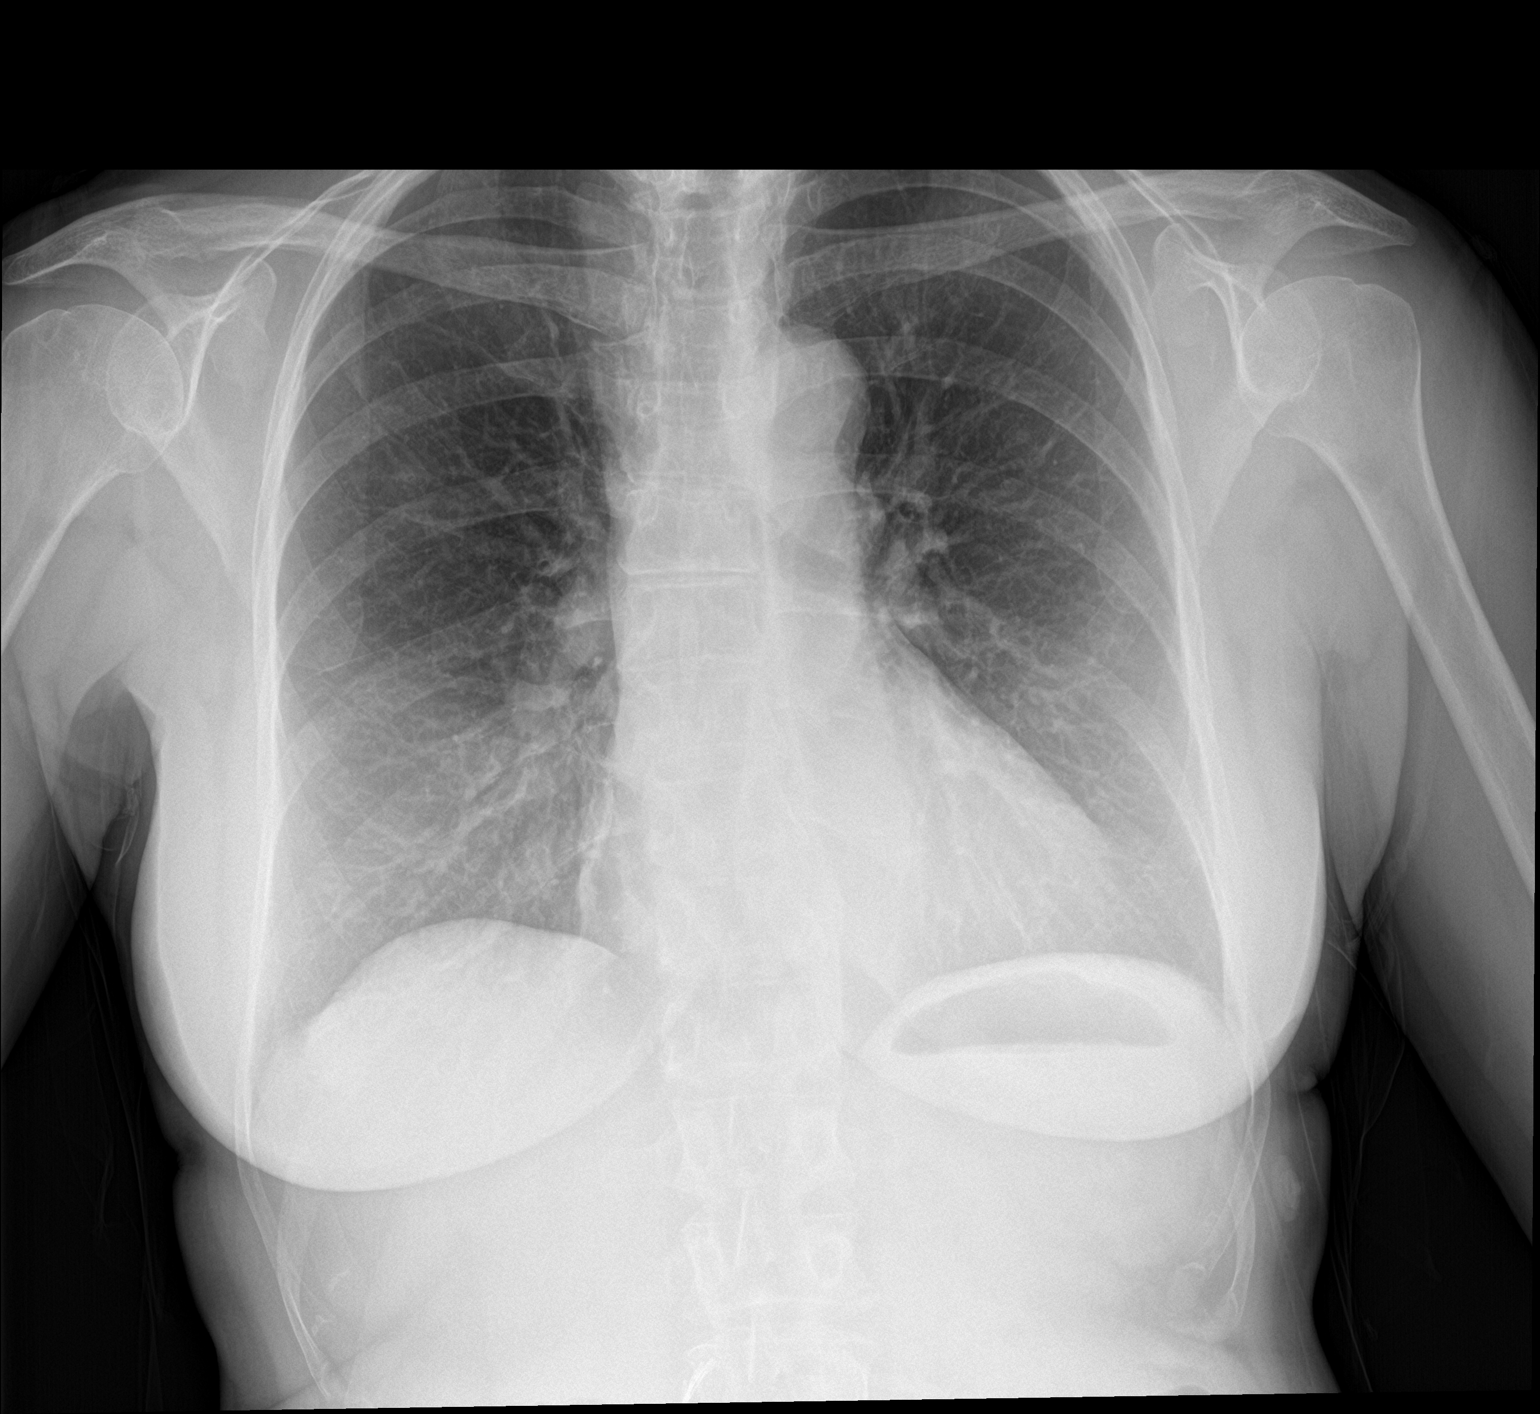

[chest lat]
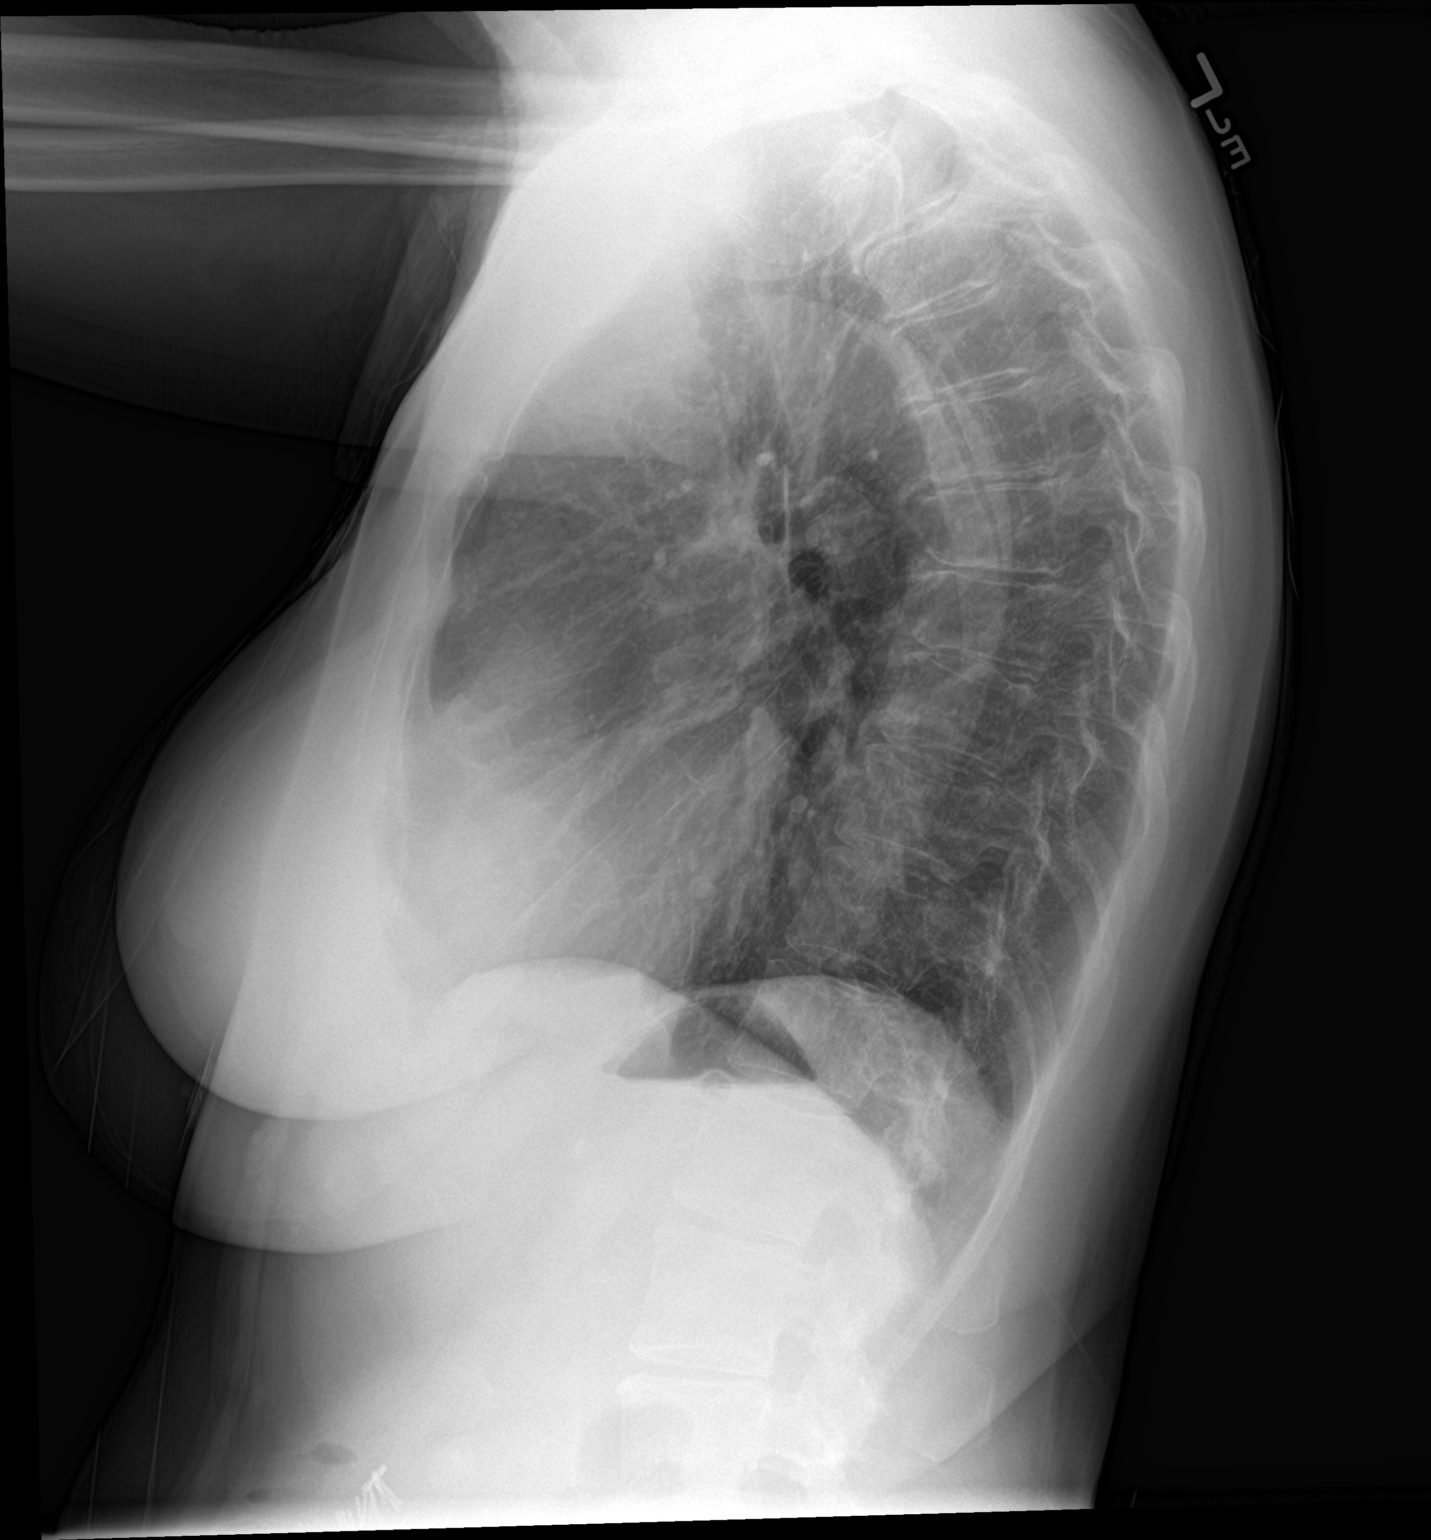

[2 of 2 positions shown; findings below may reference images not displayed]

FINDINGS: Heart and mediastinal contours are within normal limits. No focal
opacities or effusions. No acute bony abnormality.
IMPRESSION: No active cardiopulmonary disease.

## 2023-06-15 NOTE — Therapy (Unsigned)
OUTPATIENT PHYSICAL THERAPY LOWER EXTREMITY EVALUATION   Patient Name: Melinda Lane MRN: [id number] DOB:1951-04-14, 72 y.o., female Today's Date: 06/16/2023  END OF SESSION:  PT End of Session - 06/16/23 1535     Visit Number 1    Date for PT Re-Evaluation 09/08/23    PT Start Time 1533    PT Stop Time 1610    PT Time Calculation (min) 37 min    Activity Tolerance Patient tolerated treatment well    Behavior During Therapy [facility] for tasks assessed/performed             Past Medical History:  Diagnosis Date   Absence of bladder continence 11/15/2014   Acquired hypothyroidism 04/12/2012   Adjustment disorder with anxiety 02/17/2013   patient states she has no known diagnosis of this, doesn't take medication for this, states her primary care doesn't even have it noted in their records   Arterial fibromuscular dysplasia (HCC) 08/25/2011   Arthralgia of both hands 11/22/2017   Benign renovascular hypertension 06/23/2011   Formatting of this note might be different from the original. right   Cephalalgia 11/15/2014   Chest pain 04/12/2012   Crohn's disease of ileum without complication (HCC) 03/30/2016   Elevated troponin    Esophageal dysphagia 01/13/2017   HTN (hypertension) 04/12/2012   Hyperlipemia 04/12/2012   Hyperlipidemia 04/12/2012   Hypothyroidism    IBS (irritable bowel syndrome)    Incontinence in female 11/12/2014   Left knee pain 10/14/2011   Parkinson's disease (HCC) 07/24/2019   Pes anserine bursitis 10/14/2011   Primary osteoarthritis involving multiple joints 11/25/2017   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(HCC)    RLS (restless legs syndrome) 10/10/2019   Thyroid nodule 04/12/2012   Tremor 10/10/2019   Past Surgical History:  Procedure Laterality Date   APPENDECTOMY     AUGMENTATION MAMMAPLASTY     saline - 2015   CHOLECYSTECTOMY     ECTOPIC PREGNANCY SURGERY     KNEE SURGERY     LEFT HEART CATH AND CORONARY ANGIOGRAPHY N/A 03/11/2021   Procedure: LEFT HEART  CATH AND CORONARY ANGIOGRAPHY;  Surgeon: Lennette Bihari, MD;  Location: [facility];  Service: Cardiovascular;  Laterality: N/A;   TONSILLECTOMY     Patient Active Problem List   Diagnosis Date Noted   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) 05/11/2021   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(HCC) 03/30/2021   IBS (irritable bowel syndrome) 03/12/2021   Elevated troponin    RLS (restless legs syndrome) 10/10/2019   Tremor 10/10/2019   Parkinson's disease (HCC) 07/24/2019   Primary osteoarthritis involving multiple joints 11/25/2017   Arthralgia of both hands 11/22/2017   Esophageal dysphagia 01/13/2017   Crohn's disease of ileum without complication (HCC) 03/30/2016   Absence of bladder continence 11/15/2014   Cephalalgia 11/15/2014   Incontinence in female 11/12/2014   Adjustment disorder with anxiety 02/17/2013   Chest pain 04/12/2012   HTN (hypertension) 04/12/2012   Hypothyroidism 04/12/2012   Hyperlipemia 04/12/2012   Thyroid nodule 04/12/2012   Hyperlipidemia 04/12/2012   Acquired hypothyroidism 04/12/2012   Left knee pain 10/14/2011   Pes anserine bursitis 10/14/2011   Arterial fibromuscular dysplasia (HCC) 08/25/2011   Benign renovascular hypertension 06/23/2011    PCP: Malka So., MD   REFERRING PROVIDER: Beverely Low, MD   REFERRING DIAG: [phone] (ICD-10-CM) - Other specified arthritis, right knee   THERAPY DIAG:  Difficulty in walking, not elsewhere classified  Localized edema  Muscle weakness (generalized)  Unsteadiness on feet  Stiffness of right knee, not elsewhere classified  Rationale for Evaluation and Treatment: Rehabilitation  ONSET DATE: 06/13/23  SUBJECTIVE:   SUBJECTIVE STATEMENT: Patient reports that the first 2 days were not so bad due to the block. She is trying to consistently take her pain meds to control the pain. She walks about once an hour and tries to elevate the leg above her heart when able.  PERTINENT  HISTORY: PMHx: tremors, OA, HTN, R TKR 06/13/23 PAIN:  Are you having pain? Yes: NPRS scale: 8/10 Pain location: knee Pain description: Sharp, aching Aggravating factors: Being still and then trying to move Relieving factors: ice, pain meds.  PRECAUTIONS: Knee  RED FLAGS: None   WEIGHT BEARING RESTRICTIONS: Yes WBAT  FALLS:  Has patient fallen in last 6 months? No  LIVING ENVIRONMENT: Lives with: lives with their family Lives in: House/apartment Stairs: Yes: Internal: 14 steps; on right going up and External: 1 steps; none Has following equipment at home: Dan Humphreys - 2 wheeled  OCCUPATION: Works in an office. Mostly seated.  PLOF: Independent  PATIENT GOALS: Patient would like to recover her ROM and strength. Get back to golf, swimming, diving.  NEXT MD VISIT: Next Thursday, 10/10  OBJECTIVE:  Note: Objective measures were completed at Evaluation unless otherwise noted.  DIAGNOSTIC FINDINGS: N/A  COGNITION: Overall cognitive status: Within functional limits for tasks assessed     SENSATION: WFL  EDEMA:  Normal edema noted.  POSTURE: slightly flexed over walker, decreased WB through RLE  PALPATION: R LE taught throughout foot to thigh, tender points where skin is drawn tight, improved with gentle retrograde massage.  INCISION: clean, dry, no redness noted. Waterproof dressing applied and ace wraps re-applied per patient request vs the TED hose.  LOWER EXTREMITY ROM: Grossly WNL except R knee  Active ROM Right eval Left eval  Hip flexion    Hip extension    Hip abduction    Hip adduction    Hip internal rotation    Hip external rotation    Knee flexion    Knee extension 21 88  Ankle dorsiflexion    Ankle plantarflexion    Ankle inversion    Ankle eversion     (Blank rows = not tested)  LOWER EXTREMITY MMT: L 5/5  MMT Right eval Left eval  Hip flexion 3+   Hip extension 3+   Hip abduction 3+   Hip adduction    Hip internal rotation    Hip  external rotation    Knee flexion 3   Knee extension 3   Ankle dorsiflexion 3+   Ankle plantarflexion    Ankle inversion    Ankle eversion     GAIT: Distance walked: In clinic distances Assistive device utilized: Walker - 2 wheeled Level of assistance: Modified independence Comments: Patient walks with step through gait pattern decreased R stance and decreased WB trough RLE   TODAY'S TREATMENT:  DATE:  06/16/23 Education  Initiated exercises- QS, HS, AP, abd, SAQ, SLR, seated long kick and knee flex  PATIENT EDUCATION:  Education details: POC, review HEP Person educated: Patient Education method: Explanation Education comprehension: verbalized understanding  HOME EXERCISE PROGRAM:  [id number]  ASSESSMENT:  CLINICAL IMPRESSION: Patient is a 72 y.o. who was seen today for physical therapy evaluation and treatment for PT S/P R TKR 06/13/23. Patient presents with good muscle activity and ROM at 3 days post op. However, she shows weakness, decreased ROM, swelling, decreased balance, and decreased safety and I with all functional mobility. She will benefit from PT to facilitate progression through the TKR rehab process to be able to return to her PLOF.   OBJECTIVE IMPAIRMENTS: Abnormal gait, decreased activity tolerance, decreased balance, decreased coordination, decreased endurance, difficulty walking, decreased ROM, decreased strength, increased muscle spasms, impaired flexibility, improper body mechanics, postural dysfunction, and pain.   ACTIVITY LIMITATIONS: lifting, bending, sitting, standing, squatting, stairs, transfers, bed mobility, and locomotion level  PARTICIPATION LIMITATIONS: meal prep, cleaning, laundry, driving, shopping, and community activity  PERSONAL FACTORS: Past/current experiences are also affecting patient's functional outcome.   REHAB  POTENTIAL: Good  CLINICAL DECISION MAKING: Evolving/moderate complexity  EVALUATION COMPLEXITY: Moderate   GOALS: Goals reviewed with patient? Yes  SHORT TERM GOALS: Target date: 07/01/23 I with initial HEP Baseline: Goal status: INITIAL  LONG TERM GOALS: Target date: 09/08/23  I with final HEP Baseline:  Goal status: INITIAL  2.  Increase R knee AROM to 0-120 Baseline:  Goal status: INITIAL  3.  Increase R knee strength to at least 4+/5 Baseline:  Goal status: INITIAL  4.  Patient will be able to walk x at least 400' MI, with LRAD, on level and unlevel surfaces. Baseline:  Goal status: INITIAL  5.  Patient will ambulate up and down at least 12 steps using U rail and step over step, MI Baseline:  Goal status: INITIAL  6.  Patient will report initiation of return to golf with putting and practice swings at home, without pain or swelling. Baseline:  Goal status: INITIAL   PLAN:  PT FREQUENCY: 2-3x/week  PT DURATION: 12 weeks  PLANNED INTERVENTIONS: Therapeutic exercises, Therapeutic activity, Neuromuscular re-education, Balance training, Gait training, Patient/Family education, Self Care, Joint mobilization, Stair training, Electrical stimulation, Cryotherapy, Moist heat, scar mobilization, Taping, Vasopneumatic device, Ionotophoresis 4mg /ml Dexamethasone, and Manual therapy  PLAN FOR NEXT SESSION: Progress strength and ROM activities   Iona Beard, DPT 06/16/2023, 4:36 PM

## 2023-06-16 ENCOUNTER — Ambulatory Visit: Payer: Medicare Other | Attending: Orthopedic Surgery | Admitting: Physical Therapy

## 2023-06-16 ENCOUNTER — Encounter: Payer: Self-pay | Admitting: Physical Therapy

## 2023-06-16 DIAGNOSIS — M25661 Stiffness of right knee, not elsewhere classified: Secondary | ICD-10-CM | POA: Diagnosis present

## 2023-06-16 DIAGNOSIS — M6281 Muscle weakness (generalized): Secondary | ICD-10-CM | POA: Insufficient documentation

## 2023-06-16 DIAGNOSIS — R600 Localized edema: Secondary | ICD-10-CM | POA: Insufficient documentation

## 2023-06-16 DIAGNOSIS — R262 Difficulty in walking, not elsewhere classified: Secondary | ICD-10-CM | POA: Diagnosis present

## 2023-06-16 DIAGNOSIS — R2681 Unsteadiness on feet: Secondary | ICD-10-CM | POA: Insufficient documentation

## 2023-06-16 NOTE — Addendum Note (Signed)
Addended by: Iona Beard on: 06/16/2023 05:10 PM   Modules accepted: Orders

## 2023-06-16 NOTE — Therapy (Deleted)
[place] [facility] [address] [place], [place], [address] Phone: [phone]   Fax:  [phone]  Patient Details  Name: Melinda Lane MRN: [id number] Date of Birth: May 25, 1951 Referring Provider:  Beverely Low, MD  Encounter Date: 06/16/2023   Iona Beard, PT 06/16/2023, 5:04 PM  [place] [facility] [address] [place], [place], [address] Phone: [phone]   Fax:  [phone]

## 2023-06-20 ENCOUNTER — Ambulatory Visit: Payer: Medicare Other | Admitting: Physical Therapy

## 2023-06-20 ENCOUNTER — Encounter: Payer: Self-pay | Admitting: Physical Therapy

## 2023-06-20 ENCOUNTER — Ambulatory Visit: Payer: Medicare Other | Admitting: Neurology

## 2023-06-20 DIAGNOSIS — R600 Localized edema: Secondary | ICD-10-CM

## 2023-06-20 DIAGNOSIS — R2681 Unsteadiness on feet: Secondary | ICD-10-CM

## 2023-06-20 DIAGNOSIS — R262 Difficulty in walking, not elsewhere classified: Secondary | ICD-10-CM

## 2023-06-20 DIAGNOSIS — M6281 Muscle weakness (generalized): Secondary | ICD-10-CM

## 2023-06-20 NOTE — Therapy (Addendum)
OUTPATIENT PHYSICAL THERAPY LOWER EXTREMITY EVALUATION   Patient Name: Melinda Lane MRN: [id number] DOB:05-Jun-1951, 72 y.o., female Today's Date: 06/20/2023  END OF SESSION:  PT End of Session - 06/20/23 1514     Visit Number 2    Date for PT Re-Evaluation 09/08/23    PT Start Time 1515    PT Stop Time 1610    PT Time Calculation (min) 55 min    Activity Tolerance Patient tolerated treatment well    Behavior During Therapy [facility] for tasks assessed/performed             Past Medical History:  Diagnosis Date   Absence of bladder continence 11/15/2014   Acquired hypothyroidism 04/12/2012   Adjustment disorder with anxiety 02/17/2013   patient states she has no known diagnosis of this, doesn't take medication for this, states her primary care doesn't even have it noted in their records   Arterial fibromuscular dysplasia (HCC) 08/25/2011   Arthralgia of both hands 11/22/2017   Benign renovascular hypertension 06/23/2011   Formatting of this note might be different from the original. right   Cephalalgia 11/15/2014   Chest pain 04/12/2012   Crohn's disease of ileum without complication (HCC) 03/30/2016   Elevated troponin    Esophageal dysphagia 01/13/2017   HTN (hypertension) 04/12/2012   Hyperlipemia 04/12/2012   Hyperlipidemia 04/12/2012   Hypothyroidism    IBS (irritable bowel syndrome)    Incontinence in female 11/12/2014   Left knee pain 10/14/2011   Parkinson's disease (HCC) 07/24/2019   Pes anserine bursitis 10/14/2011   Primary osteoarthritis involving multiple joints 11/25/2017   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(HCC)    RLS (restless legs syndrome) 10/10/2019   Thyroid nodule 04/12/2012   Tremor 10/10/2019   Past Surgical History:  Procedure Laterality Date   APPENDECTOMY     AUGMENTATION MAMMAPLASTY     saline - 2015   CHOLECYSTECTOMY     ECTOPIC PREGNANCY SURGERY     KNEE SURGERY     LEFT HEART CATH AND CORONARY ANGIOGRAPHY N/A 03/11/2021   Procedure: LEFT HEART  CATH AND CORONARY ANGIOGRAPHY;  Surgeon: Lennette Bihari, MD;  Location: [facility];  Service: Cardiovascular;  Laterality: N/A;   TONSILLECTOMY     Patient Active Problem List   Diagnosis Date Noted   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) 05/11/2021   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(HCC) 03/30/2021   IBS (irritable bowel syndrome) 03/12/2021   Elevated troponin    RLS (restless legs syndrome) 10/10/2019   Tremor 10/10/2019   Parkinson's disease (HCC) 07/24/2019   Primary osteoarthritis involving multiple joints 11/25/2017   Arthralgia of both hands 11/22/2017   Esophageal dysphagia 01/13/2017   Crohn's disease of ileum without complication (HCC) 03/30/2016   Absence of bladder continence 11/15/2014   Cephalalgia 11/15/2014   Incontinence in female 11/12/2014   Adjustment disorder with anxiety 02/17/2013   Chest pain 04/12/2012   HTN (hypertension) 04/12/2012   Hypothyroidism 04/12/2012   Hyperlipemia 04/12/2012   Thyroid nodule 04/12/2012   Hyperlipidemia 04/12/2012   Acquired hypothyroidism 04/12/2012   Left knee pain 10/14/2011   Pes anserine bursitis 10/14/2011   Arterial fibromuscular dysplasia (HCC) 08/25/2011   Benign renovascular hypertension 06/23/2011    PCP: Malka So., MD   REFERRING PROVIDER: Beverely Low, MD   REFERRING DIAG: [phone] (ICD-10-CM) - Other specified arthritis, right knee   THERAPY DIAG:  Difficulty in walking, not elsewhere classified  Muscle weakness (generalized)  Unsteadiness on feet  Localized edema  Rationale for Evaluation and Treatment: Rehabilitation  ONSET DATE: 06/13/23  SUBJECTIVE:   SUBJECTIVE STATEMENT: "A little bit weak"   PERTINENT HISTORY: PMHx: tremors, OA, HTN, R TKR 06/13/23 PAIN:  Are you having pain? Yes: NPRS scale: 6/10 Pain location: knee Pain description: Sharp, aching Aggravating factors: Being still and then trying to move Relieving factors: ice, pain meds.  PRECAUTIONS:  Knee  RED FLAGS: None   WEIGHT BEARING RESTRICTIONS: Yes WBAT  FALLS:  Has patient fallen in last 6 months? No  LIVING ENVIRONMENT: Lives with: lives with their family Lives in: House/apartment Stairs: Yes: Internal: 14 steps; on right going up and External: 1 steps; none Has following equipment at home: Dan Humphreys - 2 wheeled  OCCUPATION: Works in an office. Mostly seated.  PLOF: Independent  PATIENT GOALS: Patient would like to recover her ROM and strength. Get back to golf, swimming, diving.  NEXT MD VISIT: Next Thursday, 10/10  OBJECTIVE:  Note: Objective measures were completed at Evaluation unless otherwise noted.  DIAGNOSTIC FINDINGS: N/A  COGNITION: Overall cognitive status: Within functional limits for tasks assessed     SENSATION: WFL  EDEMA:  Normal edema noted.  POSTURE: slightly flexed over walker, decreased WB through RLE  PALPATION: R LE taught throughout foot to thigh, tender points where skin is drawn tight, improved with gentle retrograde massage.  INCISION: clean, dry, no redness noted. Waterproof dressing applied and ace wraps re-applied per patient request vs the TED hose.  LOWER EXTREMITY ROM: Grossly WNL except R knee  Active ROM Right eval Left eval  Hip flexion    Hip extension    Hip abduction    Hip adduction    Hip internal rotation    Hip external rotation    Knee flexion    Knee extension 21 88  Ankle dorsiflexion    Ankle plantarflexion    Ankle inversion    Ankle eversion     (Blank rows = not tested)  LOWER EXTREMITY MMT: L 5/5  MMT Right eval Left eval  Hip flexion 3+   Hip extension 3+   Hip abduction 3+   Hip adduction    Hip internal rotation    Hip external rotation    Knee flexion 3   Knee extension 3   Ankle dorsiflexion 3+   Ankle plantarflexion    Ankle inversion    Ankle eversion     GAIT: Distance walked: In clinic distances Assistive device utilized: Environmental consultant - 2 wheeled Level of assistance:  Modified independence Comments: Patient walks with step through gait pattern decreased R stance and decreased WB trough RLE   TODAY'S TREATMENT:                                                                                                                              DATE:  06/20/23 NuStep L3 x6 min R knee PROM wih end range holds  Patella mobs  Tibiofemoral mobs Quad sets RLE x10  LAQ 2x10 HS curls red 2x10 S2S 2x5 w/ UE Vas R knee low pressure x 10 min  06/16/23 Education  Initiated exercises- QS, HS, AP, abd, SAQ, SLR, seated long kick and knee flex  PATIENT EDUCATION:  Education details: POC, review HEP Person educated: Patient Education method: Explanation Education comprehension: verbalized understanding  HOME EXERCISE PROGRAM:  [id number]  ASSESSMENT:  CLINICAL IMPRESSION: Patient is a 72 y.o. who was seen today for physical therapy  treatment for PT S/P R TKR 06/13/23. Pt tolerated an initial progression to TE and MT well. Some pain at end range of MT. Decrease TKE with LAQ. Good efforts throughout session. She will benefit from PT to facilitate progression through the TKR rehab process to be able to return to her PLOF.   OBJECTIVE IMPAIRMENTS: Abnormal gait, decreased activity tolerance, decreased balance, decreased coordination, decreased endurance, difficulty walking, decreased ROM, decreased strength, increased muscle spasms, impaired flexibility, improper body mechanics, postural dysfunction, and pain.   ACTIVITY LIMITATIONS: lifting, bending, sitting, standing, squatting, stairs, transfers, bed mobility, and locomotion level  PARTICIPATION LIMITATIONS: meal prep, cleaning, laundry, driving, shopping, and community activity  PERSONAL FACTORS: Past/current experiences are also affecting patient's functional outcome.   REHAB POTENTIAL: Good  CLINICAL DECISION MAKING: Evolving/moderate complexity  EVALUATION COMPLEXITY: Moderate   GOALS: Goals reviewed with  patient? Yes  SHORT TERM GOALS: Target date: 07/01/23 I with initial HEP Baseline: Goal status: INITIAL  LONG TERM GOALS: Target date: 09/08/23  I with final HEP Baseline:  Goal status: INITIAL  2.  Increase R knee AROM to 0-120 Baseline:  Goal status: INITIAL  3.  Increase R knee strength to at least 4+/5 Baseline:  Goal status: INITIAL  4.  Patient will be able to walk x at least 400' MI, with LRAD, on level and unlevel surfaces. Baseline:  Goal status: INITIAL  5.  Patient will ambulate up and down at least 12 steps using U rail and step over step, MI Baseline:  Goal status: INITIAL  6.  Patient will report initiation of return to golf with putting and practice swings at home, without pain or swelling. Baseline:  Goal status: INITIAL   PLAN:  PT FREQUENCY: 2-3x/week  PT DURATION: 12 weeks  PLANNED INTERVENTIONS: Therapeutic exercises, Therapeutic activity, Neuromuscular re-education, Balance training, Gait training, Patient/Family education, Self Care, Joint mobilization, Stair training, Electrical stimulation, Cryotherapy, Moist heat, scar mobilization, Taping, Vasopneumatic device, Ionotophoresis 4mg /ml Dexamethasone, and Manual therapy  PLAN FOR NEXT SESSION: Progress strength and ROM activities   Debroah Baller, PTA 06/20/2023, 4:04 PM

## 2023-06-22 ENCOUNTER — Encounter: Payer: Self-pay | Admitting: Physical Therapy

## 2023-06-22 ENCOUNTER — Ambulatory Visit: Payer: Medicare Other | Admitting: Physical Therapy

## 2023-06-22 DIAGNOSIS — R262 Difficulty in walking, not elsewhere classified: Secondary | ICD-10-CM

## 2023-06-22 DIAGNOSIS — M25661 Stiffness of right knee, not elsewhere classified: Secondary | ICD-10-CM

## 2023-06-22 DIAGNOSIS — M6281 Muscle weakness (generalized): Secondary | ICD-10-CM

## 2023-06-22 DIAGNOSIS — R600 Localized edema: Secondary | ICD-10-CM

## 2023-06-22 DIAGNOSIS — R2681 Unsteadiness on feet: Secondary | ICD-10-CM

## 2023-06-22 NOTE — Therapy (Signed)
OUTPATIENT PHYSICAL THERAPY LOWER EXTREMITY EVALUATION   Patient Name: Melinda Lane MRN: [id number] DOB:1951/08/08, 72 y.o., female Today's Date: 06/22/2023  END OF SESSION:  PT End of Session - 06/22/23 1522     Visit Number 3    Date for PT Re-Evaluation 09/08/23    PT Start Time 1522    PT Stop Time 1600    PT Time Calculation (min) 38 min    Activity Tolerance Patient tolerated treatment well    Behavior During Therapy [facility] for tasks assessed/performed             Past Medical History:  Diagnosis Date   Absence of bladder continence 11/15/2014   Acquired hypothyroidism 04/12/2012   Adjustment disorder with anxiety 02/17/2013   patient states she has no known diagnosis of this, doesn't take medication for this, states her primary care doesn't even have it noted in their records   Arterial fibromuscular dysplasia (HCC) 08/25/2011   Arthralgia of both hands 11/22/2017   Benign renovascular hypertension 06/23/2011   Formatting of this note might be different from the original. right   Cephalalgia 11/15/2014   Chest pain 04/12/2012   Crohn's disease of ileum without complication (HCC) 03/30/2016   Elevated troponin    Esophageal dysphagia 01/13/2017   HTN (hypertension) 04/12/2012   Hyperlipemia 04/12/2012   Hyperlipidemia 04/12/2012   Hypothyroidism    IBS (irritable bowel syndrome)    Incontinence in female 11/12/2014   Left knee pain 10/14/2011   Parkinson's disease (HCC) 07/24/2019   Pes anserine bursitis 10/14/2011   Primary osteoarthritis involving multiple joints 11/25/2017   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(HCC)    RLS (restless legs syndrome) 10/10/2019   Thyroid nodule 04/12/2012   Tremor 10/10/2019   Past Surgical History:  Procedure Laterality Date   APPENDECTOMY     AUGMENTATION MAMMAPLASTY     saline - 2015   CHOLECYSTECTOMY     ECTOPIC PREGNANCY SURGERY     KNEE SURGERY     LEFT HEART CATH AND CORONARY ANGIOGRAPHY N/A 03/11/2021   Procedure: LEFT HEART  CATH AND CORONARY ANGIOGRAPHY;  Surgeon: Lennette Bihari, MD;  Location: [facility];  Service: Cardiovascular;  Laterality: N/A;   TONSILLECTOMY     Patient Active Problem List   Diagnosis Date Noted   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) 05/11/2021   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(HCC) 03/30/2021   IBS (irritable bowel syndrome) 03/12/2021   Elevated troponin    RLS (restless legs syndrome) 10/10/2019   Tremor 10/10/2019   Parkinson's disease (HCC) 07/24/2019   Primary osteoarthritis involving multiple joints 11/25/2017   Arthralgia of both hands 11/22/2017   Esophageal dysphagia 01/13/2017   Crohn's disease of ileum without complication (HCC) 03/30/2016   Absence of bladder continence 11/15/2014   Cephalalgia 11/15/2014   Incontinence in female 11/12/2014   Adjustment disorder with anxiety 02/17/2013   Chest pain 04/12/2012   HTN (hypertension) 04/12/2012   Hypothyroidism 04/12/2012   Hyperlipemia 04/12/2012   Thyroid nodule 04/12/2012   Hyperlipidemia 04/12/2012   Acquired hypothyroidism 04/12/2012   Left knee pain 10/14/2011   Pes anserine bursitis 10/14/2011   Arterial fibromuscular dysplasia (HCC) 08/25/2011   Benign renovascular hypertension 06/23/2011    PCP: Malka So., MD   REFERRING PROVIDER: Beverely Low, MD   REFERRING DIAG: [phone] (ICD-10-CM) - Other specified arthritis, right knee   THERAPY DIAG:  Difficulty in walking, not elsewhere classified  Muscle weakness (generalized)  Localized edema  Unsteadiness on feet  Stiffness of right knee, not elsewhere classified  Rationale for Evaluation and Treatment: Rehabilitation  ONSET DATE: 06/13/23  SUBJECTIVE:   SUBJECTIVE STATEMENT: "Im good, I was rough yesterday"   PERTINENT HISTORY: PMHx: tremors, OA, HTN, R TKR 06/13/23 PAIN:  Are you having pain? Yes: NPRS scale: 6/10 Pain location: knee Pain description: Sharp, aching Aggravating factors: Being still and then  trying to move Relieving factors: ice, pain meds.  PRECAUTIONS: Knee  RED FLAGS: None   WEIGHT BEARING RESTRICTIONS: Yes WBAT  FALLS:  Has patient fallen in last 6 months? No  LIVING ENVIRONMENT: Lives with: lives with their family Lives in: House/apartment Stairs: Yes: Internal: 14 steps; on right going up and External: 1 steps; none Has following equipment at home: Dan Humphreys - 2 wheeled  OCCUPATION: Works in an office. Mostly seated.  PLOF: Independent  PATIENT GOALS: Patient would like to recover her ROM and strength. Get back to golf, swimming, diving.  NEXT MD VISIT: Next Thursday, 10/10  OBJECTIVE:  Note: Objective measures were completed at Evaluation unless otherwise noted.  DIAGNOSTIC FINDINGS: N/A  COGNITION: Overall cognitive status: Within functional limits for tasks assessed     SENSATION: WFL  EDEMA:  Normal edema noted.  POSTURE: slightly flexed over walker, decreased WB through RLE  PALPATION: R LE taught throughout foot to thigh, tender points where skin is drawn tight, improved with gentle retrograde massage.  INCISION: clean, dry, no redness noted. Waterproof dressing applied and ace wraps re-applied per patient request vs the TED hose.  LOWER EXTREMITY ROM: Grossly WNL except R knee  Active ROM Right eval Left eval  Hip flexion    Hip extension    Hip abduction    Hip adduction    Hip internal rotation    Hip external rotation    Knee flexion    Knee extension 21 88  Ankle dorsiflexion    Ankle plantarflexion    Ankle inversion    Ankle eversion     (Blank rows = not tested)  LOWER EXTREMITY MMT: L 5/5  MMT Right eval Left eval  Hip flexion 3+   Hip extension 3+   Hip abduction 3+   Hip adduction    Hip internal rotation    Hip external rotation    Knee flexion 3   Knee extension 3   Ankle dorsiflexion 3+   Ankle plantarflexion    Ankle inversion    Ankle eversion     GAIT: Distance walked: In clinic  distances Assistive device utilized: Walker - 2 wheeled Level of assistance: Modified independence Comments: Patient walks with step through gait pattern decreased R stance and decreased WB trough RLE   TODAY'S TREATMENT:                                                                                                                              DATE:  06/22/23 R knee PROM wih end range holds  Patella mobs  Tibiofemoral mobs NuStep L5 x5 min HS curls red 2x10 S2S 3x5 slightly elevated mat  LAQ 2lb 2x10 4in step ups RLE x 5 2 rails Vas R knee low pressure x 10 min  06/20/23 NuStep L3 x6 min R knee PROM wih end range holds  Patella mobs  Tibiofemoral mobs Quad sets RLE x10 LAQ 2x10 HS curls red 2x10 S2S 2x5 w/ UE Vas R knee low pressure x 10 min  06/16/23 Education  Initiated exercises- QS, HS, AP, abd, SAQ, SLR, seated long kick and knee flex  PATIENT EDUCATION:  Education details: POC, review HEP Person educated: Patient Education method: Explanation Education comprehension: verbalized understanding  HOME EXERCISE PROGRAM:  [id number]  ASSESSMENT:  CLINICAL IMPRESSION: Patient is a 72 y.o. who was seen today for physical therapy  treatment for PT S/P R TKR 06/13/23. PT enters ~ 7 minutes late for today's session. Some pain at end range of MT with a springy end feel.. Decrease TKE with LAQ. Good efforts throughout session. Tactiel cues given to prevent compensation with sit to stands. Cues to emphasize TKE with step ups. She will benefit from PT to facilitate progression through the TKR rehab process to be able to return to her PLOF.   OBJECTIVE IMPAIRMENTS: Abnormal gait, decreased activity tolerance, decreased balance, decreased coordination, decreased endurance, difficulty walking, decreased ROM, decreased strength, increased muscle spasms, impaired flexibility, improper body mechanics, postural dysfunction, and pain.   ACTIVITY LIMITATIONS: lifting, bending, sitting,  standing, squatting, stairs, transfers, bed mobility, and locomotion level  PARTICIPATION LIMITATIONS: meal prep, cleaning, laundry, driving, shopping, and community activity  PERSONAL FACTORS: Past/current experiences are also affecting patient's functional outcome.   REHAB POTENTIAL: Good  CLINICAL DECISION MAKING: Evolving/moderate complexity  EVALUATION COMPLEXITY: Moderate   GOALS: Goals reviewed with patient? Yes  SHORT TERM GOALS: Target date: 07/01/23 I with initial HEP Baseline: Goal status: INITIAL  LONG TERM GOALS: Target date: 09/08/23  I with final HEP Baseline:  Goal status: INITIAL  2.  Increase R knee AROM to 0-120 Baseline:  Goal status: INITIAL  3.  Increase R knee strength to at least 4+/5 Baseline:  Goal status: INITIAL  4.  Patient will be able to walk x at least 400' MI, with LRAD, on level and unlevel surfaces. Baseline:  Goal status: INITIAL  5.  Patient will ambulate up and down at least 12 steps using U rail and step over step, MI Baseline:  Goal status: INITIAL  6.  Patient will report initiation of return to golf with putting and practice swings at home, without pain or swelling. Baseline:  Goal status: INITIAL   PLAN:  PT FREQUENCY: 2-3x/week  PT DURATION: 12 weeks  PLANNED INTERVENTIONS: Therapeutic exercises, Therapeutic activity, Neuromuscular re-education, Balance training, Gait training, Patient/Family education, Self Care, Joint mobilization, Stair training, Electrical stimulation, Cryotherapy, Moist heat, scar mobilization, Taping, Vasopneumatic device, Ionotophoresis 4mg /ml Dexamethasone, and Manual therapy  PLAN FOR NEXT SESSION: Progress strength and ROM activities   Debroah Baller, PTA 06/22/2023, 3:23 PM

## 2023-06-24 ENCOUNTER — Ambulatory Visit: Payer: Medicare Other | Admitting: Physical Therapy

## 2023-06-27 ENCOUNTER — Ambulatory Visit: Payer: Medicare Other | Admitting: Physical Therapy

## 2023-06-27 ENCOUNTER — Encounter: Payer: Self-pay | Admitting: Physical Therapy

## 2023-06-27 DIAGNOSIS — R262 Difficulty in walking, not elsewhere classified: Secondary | ICD-10-CM

## 2023-06-27 DIAGNOSIS — M25661 Stiffness of right knee, not elsewhere classified: Secondary | ICD-10-CM

## 2023-06-27 DIAGNOSIS — M6281 Muscle weakness (generalized): Secondary | ICD-10-CM

## 2023-06-27 DIAGNOSIS — R600 Localized edema: Secondary | ICD-10-CM

## 2023-06-27 DIAGNOSIS — R2681 Unsteadiness on feet: Secondary | ICD-10-CM

## 2023-06-27 NOTE — Therapy (Addendum)
OUTPATIENT PHYSICAL THERAPY LOWER EXTREMITY EVALUATION   Patient Name: Melinda Lane MRN: [id number] DOB:1951/05/08, 72 y.o., female Today's Date: 06/27/2023  END OF SESSION:  PT End of Session - 06/27/23 1514     Visit Number 4    Date for PT Re-Evaluation 09/08/23    PT Start Time 1515    PT Stop Time 1609    PT Time Calculation (min) 54 min    Activity Tolerance Patient tolerated treatment well    Behavior During Therapy [facility] for tasks assessed/performed             Past Medical History:  Diagnosis Date   Absence of bladder continence 11/15/2014   Acquired hypothyroidism 04/12/2012   Adjustment disorder with anxiety 02/17/2013   patient states she has no known diagnosis of this, doesn't take medication for this, states her primary care doesn't even have it noted in their records   Arterial fibromuscular dysplasia (HCC) 08/25/2011   Arthralgia of both hands 11/22/2017   Benign renovascular hypertension 06/23/2011   Formatting of this note might be different from the original. right   Cephalalgia 11/15/2014   Chest pain 04/12/2012   Crohn's disease of ileum without complication (HCC) 03/30/2016   Elevated troponin    Esophageal dysphagia 01/13/2017   HTN (hypertension) 04/12/2012   Hyperlipemia 04/12/2012   Hyperlipidemia 04/12/2012   Hypothyroidism    IBS (irritable bowel syndrome)    Incontinence in female 11/12/2014   Left knee pain 10/14/2011   Parkinson's disease (HCC) 07/24/2019   Pes anserine bursitis 10/14/2011   Primary osteoarthritis involving multiple joints 11/25/2017   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(HCC)    RLS (restless legs syndrome) 10/10/2019   Thyroid nodule 04/12/2012   Tremor 10/10/2019   Past Surgical History:  Procedure Laterality Date   APPENDECTOMY     AUGMENTATION MAMMAPLASTY     saline - 2015   CHOLECYSTECTOMY     ECTOPIC PREGNANCY SURGERY     KNEE SURGERY     LEFT HEART CATH AND CORONARY ANGIOGRAPHY N/A 03/11/2021   Procedure: LEFT  HEART CATH AND CORONARY ANGIOGRAPHY;  Surgeon: Lennette Bihari, MD;  Location: [facility];  Service: Cardiovascular;  Laterality: N/A;   TONSILLECTOMY     Patient Active Problem List   Diagnosis Date Noted   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) 05/11/2021   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(HCC) 03/30/2021   IBS (irritable bowel syndrome) 03/12/2021   Elevated troponin    RLS (restless legs syndrome) 10/10/2019   Tremor 10/10/2019   Parkinson's disease (HCC) 07/24/2019   Primary osteoarthritis involving multiple joints 11/25/2017   Arthralgia of both hands 11/22/2017   Esophageal dysphagia 01/13/2017   Crohn's disease of ileum without complication (HCC) 03/30/2016   Absence of bladder continence 11/15/2014   Cephalalgia 11/15/2014   Incontinence in female 11/12/2014   Adjustment disorder with anxiety 02/17/2013   Chest pain 04/12/2012   HTN (hypertension) 04/12/2012   Hypothyroidism 04/12/2012   Hyperlipemia 04/12/2012   Thyroid nodule 04/12/2012   Hyperlipidemia 04/12/2012   Acquired hypothyroidism 04/12/2012   Left knee pain 10/14/2011   Pes anserine bursitis 10/14/2011   Arterial fibromuscular dysplasia (HCC) 08/25/2011   Benign renovascular hypertension 06/23/2011    PCP: Malka So., MD   REFERRING PROVIDER: Beverely Low, MD   REFERRING DIAG: [phone] (ICD-10-CM) - Other specified arthritis, right knee   THERAPY DIAG:  Difficulty in walking, not elsewhere classified  Muscle weakness (generalized)  Localized edema  Unsteadiness on feet  Stiffness of right knee, not elsewhere classified  Rationale for Evaluation and Treatment: Rehabilitation  ONSET DATE: 06/13/23  SUBJECTIVE:   SUBJECTIVE STATEMENT: "Good"   PERTINENT HISTORY: PMHx: tremors, OA, HTN, R TKR 06/13/23 PAIN:  Are you having pain? Yes: NPRS scale: 5/10 Pain location: knee Pain description: Sharp, aching Aggravating factors: Being still and then trying to  move Relieving factors: ice, pain meds.  PRECAUTIONS: Knee  RED FLAGS: None   WEIGHT BEARING RESTRICTIONS: Yes WBAT  FALLS:  Has patient fallen in last 6 months? No  LIVING ENVIRONMENT: Lives with: lives with their family Lives in: House/apartment Stairs: Yes: Internal: 14 steps; on right going up and External: 1 steps; none Has following equipment at home: Dan Humphreys - 2 wheeled  OCCUPATION: Works in an office. Mostly seated.  PLOF: Independent  PATIENT GOALS: Patient would like to recover her ROM and strength. Get back to golf, swimming, diving.  NEXT MD VISIT: Next Thursday, 10/10  OBJECTIVE:  Note: Objective measures were completed at Evaluation unless otherwise noted.  DIAGNOSTIC FINDINGS: N/A  COGNITION: Overall cognitive status: Within functional limits for tasks assessed     SENSATION: WFL  EDEMA:  Normal edema noted.  POSTURE: slightly flexed over walker, decreased WB through RLE  PALPATION: R LE taught throughout foot to thigh, tender points where skin is drawn tight, improved with gentle retrograde massage.  INCISION: clean, dry, no redness noted. Waterproof dressing applied and ace wraps re-applied per patient request vs the TED hose.  LOWER EXTREMITY ROM: Grossly WNL except R knee  Active ROM Right eval Left eval  Hip flexion    Hip extension    Hip abduction    Hip adduction    Hip internal rotation    Hip external rotation    Knee flexion    Knee extension 21 88  Ankle dorsiflexion    Ankle plantarflexion    Ankle inversion    Ankle eversion     (Blank rows = not tested)  LOWER EXTREMITY MMT: L 5/5  MMT Right eval Left eval  Hip flexion 3+   Hip extension 3+   Hip abduction 3+   Hip adduction    Hip internal rotation    Hip external rotation    Knee flexion 3   Knee extension 3   Ankle dorsiflexion 3+   Ankle plantarflexion    Ankle inversion    Ankle eversion     GAIT: Distance walked: In clinic distances Assistive  device utilized: Environmental consultant - 2 wheeled Level of assistance: Modified independence Comments: Patient walks with step through gait pattern decreased R stance and decreased WB trough RLE   TODAY'S TREATMENT:                                                                                                                              DATE:  06/27/23 Bike partial & full Revolutions L1  x5 min  Gait outside around widest part of  back parking lot S2S 2x10 R knee PROM wih end range holds  Patella mobs  Tibiofemoral mobs  HS curls red 2x10 Vas R knee low pressure x 10 min  06/22/23 R knee PROM wih end range holds  Patella mobs  Tibiofemoral mobs NuStep L5 x5 min HS curls red 2x10 S2S 3x5 slightly elevated mat  LAQ 2lb 2x10 4in step ups RLE x 5 2 rails Vas R knee low pressure x 10 min  06/20/23 NuStep L3 x6 min R knee PROM wih end range holds  Patella mobs  Tibiofemoral mobs Quad sets RLE x10 LAQ 2x10 HS curls red 2x10 S2S 2x5 w/ UE Vas R knee low pressure x 10 min  06/16/23 Education  Initiated exercises- QS, HS, AP, abd, SAQ, SLR, seated long kick and knee flex  PATIENT EDUCATION:  Education details: POC, review HEP Person educated: Patient Education method: Explanation Education comprehension: verbalized understanding  HOME EXERCISE PROGRAM:  [id number]  ASSESSMENT:  CLINICAL IMPRESSION: Patient is a 72 y.o. who was seen today for physical therapy  treatment for PT S/P R TKR 06/13/23. Pt enters doing well ambulating without AD. Pt able to progress to outdoor ambulation during today's session going up and down slope.  Some pain at end range of MT with a springy end feel. Decrease TKE and step length noted with gait.Peri [place] efforts throughout session. Tactiel cues given to prevent compensation with sit to stands.  She will benefit from PT to facilitate progression through the TKR rehab process to be able to return to her PLOF.   OBJECTIVE IMPAIRMENTS: Abnormal gait, decreased  activity tolerance, decreased balance, decreased coordination, decreased endurance, difficulty walking, decreased ROM, decreased strength, increased muscle spasms, impaired flexibility, improper body mechanics, postural dysfunction, and pain.   ACTIVITY LIMITATIONS: lifting, bending, sitting, standing, squatting, stairs, transfers, bed mobility, and locomotion level  PARTICIPATION LIMITATIONS: meal prep, cleaning, laundry, driving, shopping, and community activity  PERSONAL FACTORS: Past/current experiences are also affecting patient's functional outcome.   REHAB POTENTIAL: Good  CLINICAL DECISION MAKING: Evolving/moderate complexity  EVALUATION COMPLEXITY: Moderate   GOALS: Goals reviewed with patient? Yes  SHORT TERM GOALS: Target date: 07/01/23 I with initial HEP Baseline: Goal status: INITIAL  LONG TERM GOALS: Target date: 09/08/23  I with final HEP Baseline:  Goal status: INITIAL  2.  Increase R knee AROM to 0-120 Baseline:  Goal status: INITIAL  3.  Increase R knee strength to at least 4+/5 Baseline:  Goal status: INITIAL  4.  Patient will be able to walk x at least 400' MI, with LRAD, on level and unlevel surfaces. Baseline:  Goal status: INITIAL  5.  Patient will ambulate up and down at least 12 steps using U rail and step over step, MI Baseline:  Goal status: INITIAL  6.  Patient will report initiation of return to golf with putting and practice swings at home, without pain or swelling. Baseline:  Goal status: INITIAL   PLAN:  PT FREQUENCY: 2-3x/week  PT DURATION: 12 weeks  PLANNED INTERVENTIONS: Therapeutic exercises, Therapeutic activity, Neuromuscular re-education, Balance training, Gait training, Patient/Family education, Self Care, Joint mobilization, Stair training, Electrical stimulation, Cryotherapy, Moist heat, scar mobilization, Taping, Vasopneumatic device, Ionotophoresis 4mg /ml Dexamethasone, and Manual therapy  PLAN FOR NEXT SESSION:  Progress strength and ROM activities   Debroah Baller, PTA 06/27/2023, 3:57 PM

## 2023-06-29 ENCOUNTER — Ambulatory Visit: Payer: Medicare Other | Admitting: Physical Therapy

## 2023-06-29 ENCOUNTER — Encounter: Payer: Self-pay | Admitting: Physical Therapy

## 2023-06-29 DIAGNOSIS — M6281 Muscle weakness (generalized): Secondary | ICD-10-CM

## 2023-06-29 DIAGNOSIS — R262 Difficulty in walking, not elsewhere classified: Secondary | ICD-10-CM

## 2023-06-29 DIAGNOSIS — R2681 Unsteadiness on feet: Secondary | ICD-10-CM

## 2023-06-29 DIAGNOSIS — M25661 Stiffness of right knee, not elsewhere classified: Secondary | ICD-10-CM

## 2023-06-29 DIAGNOSIS — R600 Localized edema: Secondary | ICD-10-CM

## 2023-06-29 NOTE — Therapy (Signed)
OUTPATIENT PHYSICAL THERAPY LOWER EXTREMITY EVALUATION   Patient Name: Melinda Lane MRN: [id number] DOB:[birth year], 72 y.o., female Today's Date: 06/29/2023  END OF SESSION:  PT End of Session - 06/29/23 1514     Visit Number 5    Date for PT Re-Evaluation 09/08/23    PT Start Time 1515    PT Stop Time 1600    PT Time Calculation (min) 45 min    Activity Tolerance Patient tolerated treatment well    Behavior During Therapy [facility] for tasks assessed/performed             Past Medical History:  Diagnosis Date   Absence of bladder continence 11/15/2014   Acquired hypothyroidism 04/12/2012   Adjustment disorder with anxiety 02/17/2013   patient states she has no known diagnosis of this, doesn't take medication for this, states her primary care doesn't even have it noted in their records   Arterial fibromuscular dysplasia (HCC) 08/25/2011   Arthralgia of both hands 11/22/2017   Benign renovascular hypertension 06/23/2011   Formatting of this note might be different from the original. right   Cephalalgia 11/15/2014   Chest pain 04/12/2012   Crohn's disease of ileum without complication (HCC) 03/30/2016   Elevated troponin    Esophageal dysphagia 01/13/2017   HTN (hypertension) 04/12/2012   Hyperlipemia 04/12/2012   Hyperlipidemia 04/12/2012   Hypothyroidism    IBS (irritable bowel syndrome)    Incontinence in female 11/12/2014   Left knee pain 10/14/2011   Parkinson's disease (HCC) 07/24/2019   Pes anserine bursitis 10/14/2011   Primary osteoarthritis involving multiple joints 11/25/2017   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(HCC)    RLS (restless legs syndrome) 10/10/2019   Thyroid nodule 04/12/2012   Tremor 10/10/2019   Past Surgical History:  Procedure Laterality Date   APPENDECTOMY     AUGMENTATION MAMMAPLASTY     saline - 2015   CHOLECYSTECTOMY     ECTOPIC PREGNANCY SURGERY     KNEE SURGERY     LEFT HEART CATH AND CORONARY ANGIOGRAPHY N/A 03/11/2021   Procedure: LEFT  HEART CATH AND CORONARY ANGIOGRAPHY;  Surgeon: Lennette Bihari, MD;  Location: [facility];  Service: Cardiovascular;  Laterality: N/A;   TONSILLECTOMY     Patient Active Problem List   Diagnosis Date Noted   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) 05/11/2021   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(HCC) 03/30/2021   IBS (irritable bowel syndrome) 03/12/2021   Elevated troponin    RLS (restless legs syndrome) 10/10/2019   Tremor 10/10/2019   Parkinson's disease (HCC) 07/24/2019   Primary osteoarthritis involving multiple joints 11/25/2017   Arthralgia of both hands 11/22/2017   Esophageal dysphagia 01/13/2017   Crohn's disease of ileum without complication (HCC) 03/30/2016   Absence of bladder continence 11/15/2014   Cephalalgia 11/15/2014   Incontinence in female 11/12/2014   Adjustment disorder with anxiety 02/17/2013   Chest pain 04/12/2012   HTN (hypertension) 04/12/2012   Hypothyroidism 04/12/2012   Hyperlipemia 04/12/2012   Thyroid nodule 04/12/2012   Hyperlipidemia 04/12/2012   Acquired hypothyroidism 04/12/2012   Left knee pain 10/14/2011   Pes anserine bursitis 10/14/2011   Arterial fibromuscular dysplasia (HCC) 08/25/2011   Benign renovascular hypertension 06/23/2011    PCP: Malka So., MD   REFERRING PROVIDER: Beverely Low, MD   REFERRING DIAG: [phone] (ICD-10-CM) - Other specified arthritis, right knee   THERAPY DIAG:  Difficulty in walking, not elsewhere classified  Muscle weakness (generalized)  Unsteadiness on feet  Localized edema  Stiffness of right knee, not elsewhere classified  Rationale for Evaluation and Treatment: Rehabilitation  ONSET DATE: 06/13/23  SUBJECTIVE:   SUBJECTIVE STATEMENT: "Stiff today"  PERTINENT HISTORY: PMHx: tremors, OA, HTN, R TKR 06/13/23 PAIN:  Are you having pain? Yes: NPRS scale: 4/10 Pain location: knee Pain description: Sharp, aching Aggravating factors: Being still and then trying to  move Relieving factors: ice, pain meds.  PRECAUTIONS: Knee  RED FLAGS: None   WEIGHT BEARING RESTRICTIONS: Yes WBAT  FALLS:  Has patient fallen in last 6 months? No  LIVING ENVIRONMENT: Lives with: lives with their family Lives in: House/apartment Stairs: Yes: Internal: 14 steps; on right going up and External: 1 steps; none Has following equipment at home: Dan Humphreys - 2 wheeled  OCCUPATION: Works in an office. Mostly seated.  PLOF: Independent  PATIENT GOALS: Patient would like to recover her ROM and strength. Get back to golf, swimming, diving.  NEXT MD VISIT: Next Thursday, 10/10  OBJECTIVE:  Note: Objective measures were completed at Evaluation unless otherwise noted.  DIAGNOSTIC FINDINGS: N/A  COGNITION: Overall cognitive status: Within functional limits for tasks assessed     SENSATION: WFL  EDEMA:  Normal edema noted.  POSTURE: slightly flexed over walker, decreased WB through RLE  PALPATION: R LE taught throughout foot to thigh, tender points where skin is drawn tight, improved with gentle retrograde massage.  INCISION: clean, dry, no redness noted. Waterproof dressing applied and ace wraps re-applied per patient request vs the TED hose.  LOWER EXTREMITY ROM: Grossly WNL except R knee  Active ROM Right eval Left eval R knee 06/29/23  Hip flexion     Hip extension     Hip abduction     Hip adduction     Hip internal rotation     Hip external rotation     Knee flexion   10  Knee extension 21 88 101  Ankle dorsiflexion     Ankle plantarflexion     Ankle inversion     Ankle eversion      (Blank rows = not tested)  LOWER EXTREMITY MMT: L 5/5  MMT Right eval Left eval  Hip flexion 3+   Hip extension 3+   Hip abduction 3+   Hip adduction    Hip internal rotation    Hip external rotation    Knee flexion 3   Knee extension 3   Ankle dorsiflexion 3+   Ankle plantarflexion    Ankle inversion    Ankle eversion     GAIT: Distance walked:  In clinic distances Assistive device utilized: Walker - 2 wheeled Level of assistance: Modified independence Comments: Patient walks with step through gait pattern decreased R stance and decreased WB trough RLE   TODAY'S TREATMENT:                                                                                                                              DATE:  06/29/23 NuStep  L 4 x 5 min Bike partial & full Revolutions L1  x5 min  Resisted gait 20lb 4 way x3 each Leg press 20lb 2x10 R knee PROM wih end range holds  Patella mobs  Tibiofemoral mobs Vas R knee low pressure x 10 min  06/27/23 Bike partial & full Revolutions L1  x5 min  Gait outside around widest part of back parking lot S2S 2x10 R knee PROM wih end range holds  Patella mobs  Tibiofemoral mobs  HS curls red 2x10 Vas R knee low pressure x 10 min  06/22/23 R knee PROM wih end range holds  Patella mobs  Tibiofemoral mobs NuStep L5 x5 min HS curls red 2x10 S2S 3x5 slightly elevated mat  LAQ 2lb 2x10 4in step ups RLE x 5 2 rails Vas R knee low pressure x 10 min  06/20/23 NuStep L3 x6 min R knee PROM wih end range holds  Patella mobs  Tibiofemoral mobs Quad sets RLE x10 LAQ 2x10 HS curls red 2x10 S2S 2x5 w/ UE Vas R knee low pressure x 10 min  06/16/23 Education  Initiated exercises- QS, HS, AP, abd, SAQ, SLR, seated long kick and knee flex  PATIENT EDUCATION:  Education details: POC, review HEP Person educated: Patient Education method: Explanation Education comprehension: verbalized understanding  HOME EXERCISE PROGRAM:  [id number]  ASSESSMENT:  CLINICAL IMPRESSION: Patient is a 72 y.o. who was seen today for physical therapy  treatment for PT S/P R TKR 06/13/23. Pt enters doing well ambulating without AD. Pt has progressed increasing her R knee AROM. Cue to increase step length and control eccentric phase of resisted gait. First initial reps of leg press were difficult but pt did better as reps  progressed. Springy end feel with L knee Passive flexion, pain limiting factor.  She will benefit from PT to facilitate progression through the TKR rehab process to be able to return to her PLOF.   OBJECTIVE IMPAIRMENTS: Abnormal gait, decreased activity tolerance, decreased balance, decreased coordination, decreased endurance, difficulty walking, decreased ROM, decreased strength, increased muscle spasms, impaired flexibility, improper body mechanics, postural dysfunction, and pain.   ACTIVITY LIMITATIONS: lifting, bending, sitting, standing, squatting, stairs, transfers, bed mobility, and locomotion level  PARTICIPATION LIMITATIONS: meal prep, cleaning, laundry, driving, shopping, and community activity  PERSONAL FACTORS: Past/current experiences are also affecting patient's functional outcome.   REHAB POTENTIAL: Good  CLINICAL DECISION MAKING: Evolving/moderate complexity  EVALUATION COMPLEXITY: Moderate   GOALS: Goals reviewed with patient? Yes  SHORT TERM GOALS: Target date: 07/01/23 I with initial HEP Baseline: Goal status: Met 06/29/23  LONG TERM GOALS: Target date: 09/08/23  I with final HEP Baseline:  Goal status: INITIAL  2.  Increase R knee AROM to 0-120 Baseline:  Goal status: INITIAL  3.  Increase R knee strength to at least 4+/5 Baseline:  Goal status: INITIAL  4.  Patient will be able to walk x at least 400' MI, with LRAD, on level and unlevel surfaces. Baseline:  Goal status: Progressing 06/29/23  5.  Patient will ambulate up and down at least 12 steps using U rail and step over step, MI Baseline:  Goal status: INITIAL  6.  Patient will report initiation of return to golf with putting and practice swings at home, without pain or swelling. Baseline:  Goal status: INITIAL   PLAN:  PT FREQUENCY: 2-3x/week  PT DURATION: 12 weeks  PLANNED INTERVENTIONS: Therapeutic exercises, Therapeutic activity, Neuromuscular re-education, Balance training, Gait  training, Patient/Family education, Self Care, Joint mobilization, Stair  training, Electrical stimulation, Cryotherapy, Moist heat, scar mobilization, Taping, Vasopneumatic device, Ionotophoresis 4mg /ml Dexamethasone, and Manual therapy  PLAN FOR NEXT SESSION: Progress strength and ROM activities   Debroah Baller, PTA 06/29/2023, 3:15 PM

## 2023-07-01 ENCOUNTER — Ambulatory Visit: Payer: Medicare Other | Admitting: Physical Therapy

## 2023-07-04 ENCOUNTER — Ambulatory Visit: Payer: Medicare Other | Admitting: Physical Therapy

## 2023-07-04 ENCOUNTER — Encounter: Payer: Self-pay | Admitting: Physical Therapy

## 2023-07-04 DIAGNOSIS — M6281 Muscle weakness (generalized): Secondary | ICD-10-CM

## 2023-07-04 DIAGNOSIS — R600 Localized edema: Secondary | ICD-10-CM

## 2023-07-04 DIAGNOSIS — R262 Difficulty in walking, not elsewhere classified: Secondary | ICD-10-CM | POA: Diagnosis not present

## 2023-07-04 DIAGNOSIS — R2681 Unsteadiness on feet: Secondary | ICD-10-CM

## 2023-07-04 NOTE — Therapy (Signed)
OUTPATIENT PHYSICAL THERAPY LOWER EXTREMITY EVALUATION   Patient Name: Melinda Lane MRN: [id number] DOB:11/04/1950, 72 y.o., female Today's Date: 07/04/2023  END OF SESSION:  PT End of Session - 07/04/23 1517     Visit Number 6    Date for PT Re-Evaluation 09/08/23    PT Start Time 1515    PT Stop Time 1600    PT Time Calculation (min) 45 min    Activity Tolerance Patient tolerated treatment well    Behavior During Therapy [facility] for tasks assessed/performed             Past Medical History:  Diagnosis Date   Absence of bladder continence 11/15/2014   Acquired hypothyroidism 04/12/2012   Adjustment disorder with anxiety 02/17/2013   patient states she has no known diagnosis of this, doesn't take medication for this, states her primary care doesn't even have it noted in their records   Arterial fibromuscular dysplasia (HCC) 08/25/2011   Arthralgia of both hands 11/22/2017   Benign renovascular hypertension 06/23/2011   Formatting of this note might be different from the original. right   Cephalalgia 11/15/2014   Chest pain 04/12/2012   Crohn's disease of ileum without complication (HCC) 03/30/2016   Elevated troponin    Esophageal dysphagia 01/13/2017   HTN (hypertension) 04/12/2012   Hyperlipemia 04/12/2012   Hyperlipidemia 04/12/2012   Hypothyroidism    IBS (irritable bowel syndrome)    Incontinence in female 11/12/2014   Left knee pain 10/14/2011   Parkinson's disease (HCC) 07/24/2019   Pes anserine bursitis 10/14/2011   Primary osteoarthritis involving multiple joints 11/25/2017   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(HCC)    RLS (restless legs syndrome) 10/10/2019   Thyroid nodule 04/12/2012   Tremor 10/10/2019   Past Surgical History:  Procedure Laterality Date   APPENDECTOMY     AUGMENTATION MAMMAPLASTY     saline - 2015   CHOLECYSTECTOMY     ECTOPIC PREGNANCY SURGERY     KNEE SURGERY     LEFT HEART CATH AND CORONARY ANGIOGRAPHY N/A 03/11/2021   Procedure: LEFT  HEART CATH AND CORONARY ANGIOGRAPHY;  Surgeon: Lennette Bihari, MD;  Location: [facility];  Service: Cardiovascular;  Laterality: N/A;   TONSILLECTOMY     Patient Active Problem List   Diagnosis Date Noted   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) 05/11/2021   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(HCC) 03/30/2021   IBS (irritable bowel syndrome) 03/12/2021   Elevated troponin    RLS (restless legs syndrome) 10/10/2019   Tremor 10/10/2019   Parkinson's disease (HCC) 07/24/2019   Primary osteoarthritis involving multiple joints 11/25/2017   Arthralgia of both hands 11/22/2017   Esophageal dysphagia 01/13/2017   Crohn's disease of ileum without complication (HCC) 03/30/2016   Absence of bladder continence 11/15/2014   Cephalalgia 11/15/2014   Incontinence in female 11/12/2014   Adjustment disorder with anxiety 02/17/2013   Chest pain 04/12/2012   HTN (hypertension) 04/12/2012   Hypothyroidism 04/12/2012   Hyperlipemia 04/12/2012   Thyroid nodule 04/12/2012   Hyperlipidemia 04/12/2012   Acquired hypothyroidism 04/12/2012   Left knee pain 10/14/2011   Pes anserine bursitis 10/14/2011   Arterial fibromuscular dysplasia (HCC) 08/25/2011   Benign renovascular hypertension 06/23/2011    PCP: Malka So., MD   REFERRING PROVIDER: Beverely Low, MD   REFERRING DIAG: [phone] (ICD-10-CM) - Other specified arthritis, right knee   THERAPY DIAG:  Difficulty in walking, not elsewhere classified  Muscle weakness (generalized)  Localized edema  Unsteadiness on feet  Rationale for Evaluation and Treatment: Rehabilitation  ONSET DATE: 06/13/23  SUBJECTIVE:   SUBJECTIVE STATEMENT: "Doing exercises at home on the days I don't come here"  PERTINENT HISTORY: PMHx: tremors, OA, HTN, R TKR 06/13/23 PAIN:  Are you having pain? Yes: NPRS scale: 4/10 Pain location: knee Pain description: Sharp, aching Aggravating factors: Being still and then trying to move Relieving  factors: ice, pain meds.  PRECAUTIONS: Knee  RED FLAGS: None   WEIGHT BEARING RESTRICTIONS: Yes WBAT  FALLS:  Has patient fallen in last 6 months? No  LIVING ENVIRONMENT: Lives with: lives with their family Lives in: House/apartment Stairs: Yes: Internal: 14 steps; on right going up and External: 1 steps; none Has following equipment at home: Dan Humphreys - 2 wheeled  OCCUPATION: Works in an office. Mostly seated.  PLOF: Independent  PATIENT GOALS: Patient would like to recover her ROM and strength. Get back to golf, swimming, diving.  NEXT MD VISIT: Next Thursday, 10/10  OBJECTIVE:  Note: Objective measures were completed at Evaluation unless otherwise noted.  DIAGNOSTIC FINDINGS: N/A  COGNITION: Overall cognitive status: Within functional limits for tasks assessed     SENSATION: WFL  EDEMA:  Normal edema noted.  POSTURE: slightly flexed over walker, decreased WB through RLE  PALPATION: R LE taught throughout foot to thigh, tender points where skin is drawn tight, improved with gentle retrograde massage.  INCISION: clean, dry, no redness noted. Waterproof dressing applied and ace wraps re-applied per patient request vs the TED hose.  LOWER EXTREMITY ROM: Grossly WNL except R knee  Active ROM Right eval Left eval R knee 06/29/23  Hip flexion     Hip extension     Hip abduction     Hip adduction     Hip internal rotation     Hip external rotation     Knee flexion   10  Knee extension 21 88 101  Ankle dorsiflexion     Ankle plantarflexion     Ankle inversion     Ankle eversion      (Blank rows = not tested)  LOWER EXTREMITY MMT: L 5/5  MMT Right eval Left eval  Hip flexion 3+   Hip extension 3+   Hip abduction 3+   Hip adduction    Hip internal rotation    Hip external rotation    Knee flexion 3   Knee extension 3   Ankle dorsiflexion 3+   Ankle plantarflexion    Ankle inversion    Ankle eversion     GAIT: Distance walked: In clinic  distances Assistive device utilized: Walker - 2 wheeled Level of assistance: Modified independence Comments: Patient walks with step through gait pattern decreased R stance and decreased WB trough RLE   TODAY'S TREATMENT:                                                                                                                              DATE:  07/04/23  NuStep L 4 x 4 min Bike partial & full Revolutions L1  x3 min  Resisted gait 20lb 4 way x3 each Heel raises black bar 2x15 4in step ups x10 R knee PROM wih end range holds  Patella mobs  Tibiofemoral mobs Vas R knee low pressure x 10 min  06/29/23 NuStep L 4 x 5 min Bike partial & full Revolutions L1  x5 min  Resisted gait 20lb 4 way x3 each Leg press 20lb 2x10 R knee PROM wih end range holds  Patella mobs  Tibiofemoral mobs Vas R knee low pressure x 10 min  06/27/23 Bike partial & full Revolutions L1  x5 min  Gait outside around widest part of back parking lot S2S 2x10 R knee PROM wih end range holds  Patella mobs  Tibiofemoral mobs  HS curls red 2x10 Vas R knee low pressure x 10 min  06/22/23 R knee PROM wih end range holds  Patella mobs  Tibiofemoral mobs NuStep L5 x5 min HS curls red 2x10 S2S 3x5 slightly elevated mat  LAQ 2lb 2x10 4in step ups RLE x 5 2 rails Vas R knee low pressure x 10 min  06/20/23 NuStep L3 x6 min R knee PROM wih end range holds  Patella mobs  Tibiofemoral mobs Quad sets RLE x10 LAQ 2x10 HS curls red 2x10 S2S 2x5 w/ UE Vas R knee low pressure x 10 min  06/16/23 Education  Initiated exercises- QS, HS, AP, abd, SAQ, SLR, seated long kick and knee flex  PATIENT EDUCATION:  Education details: POC, review HEP Person educated: Patient Education method: Explanation Education comprehension: verbalized understanding  HOME EXERCISE PROGRAM:  [id number]  ASSESSMENT:  CLINICAL IMPRESSION: Patient is a 72 y.o. who was seen today for physical therapy  treatment for PT S/P R TKR  06/13/23. Pt enters doing well ambulating without AD. Cue to increase step length and control eccentric phase of resisted gait. Tactiel cues to LLE needed to prevent compensation while pt was doing step ups with RLE. Springy end feel with L knee Passive flexion, pain limiting factor.  She will benefit from PT to facilitate progression through the TKR rehab process to be able to return to her PLOF.   OBJECTIVE IMPAIRMENTS: Abnormal gait, decreased activity tolerance, decreased balance, decreased coordination, decreased endurance, difficulty walking, decreased ROM, decreased strength, increased muscle spasms, impaired flexibility, improper body mechanics, postural dysfunction, and pain.   ACTIVITY LIMITATIONS: lifting, bending, sitting, standing, squatting, stairs, transfers, bed mobility, and locomotion level  PARTICIPATION LIMITATIONS: meal prep, cleaning, laundry, driving, shopping, and community activity  PERSONAL FACTORS: Past/current experiences are also affecting patient's functional outcome.   REHAB POTENTIAL: Good  CLINICAL DECISION MAKING: Evolving/moderate complexity  EVALUATION COMPLEXITY: Moderate   GOALS: Goals reviewed with patient? Yes  SHORT TERM GOALS: Target date: 07/01/23 I with initial HEP Baseline: Goal status: Met 06/29/23  LONG TERM GOALS: Target date: 09/08/23  I with final HEP Baseline:  Goal status: INITIAL  2.  Increase R knee AROM to 0-120 Baseline:  Goal status: INITIAL  3.  Increase R knee strength to at least 4+/5 Baseline:  Goal status: INITIAL  4.  Patient will be able to walk x at least 400' MI, with LRAD, on level and unlevel surfaces. Baseline:  Goal status: Progressing 06/29/23  5.  Patient will ambulate up and down at least 12 steps using U rail and step over step, MI Baseline:  Goal status: INITIAL  6.  Patient will report initiation of return to golf  with putting and practice swings at home, without pain or swelling. Baseline:   Goal status: INITIAL   PLAN:  PT FREQUENCY: 2-3x/week  PT DURATION: 12 weeks  PLANNED INTERVENTIONS: Therapeutic exercises, Therapeutic activity, Neuromuscular re-education, Balance training, Gait training, Patient/Family education, Self Care, Joint mobilization, Stair training, Electrical stimulation, Cryotherapy, Moist heat, scar mobilization, Taping, Vasopneumatic device, Ionotophoresis 4mg /ml Dexamethasone, and Manual therapy  PLAN FOR NEXT SESSION: Progress strength and ROM activities   Debroah Baller, PTA 07/04/2023, 3:18 PM

## 2023-07-06 ENCOUNTER — Encounter: Payer: Self-pay | Admitting: Physical Therapy

## 2023-07-06 ENCOUNTER — Ambulatory Visit: Payer: Medicare Other | Admitting: Physical Therapy

## 2023-07-06 DIAGNOSIS — M6281 Muscle weakness (generalized): Secondary | ICD-10-CM

## 2023-07-06 DIAGNOSIS — R262 Difficulty in walking, not elsewhere classified: Secondary | ICD-10-CM

## 2023-07-06 DIAGNOSIS — M25661 Stiffness of right knee, not elsewhere classified: Secondary | ICD-10-CM

## 2023-07-06 DIAGNOSIS — R600 Localized edema: Secondary | ICD-10-CM

## 2023-07-06 DIAGNOSIS — R2681 Unsteadiness on feet: Secondary | ICD-10-CM

## 2023-07-06 NOTE — Therapy (Signed)
OUTPATIENT PHYSICAL THERAPY LOWER EXTREMITY EVALUATION   Patient Name: Melinda Lane MRN: [id number] DOB:06/11/1951, 72 y.o., female Today's Date: 07/06/2023  END OF SESSION:  PT End of Session - 07/06/23 1515     Visit Number 7    Date for PT Re-Evaluation 09/08/23    PT Start Time 1515    PT Stop Time 1610    PT Time Calculation (min) 55 min    Activity Tolerance Patient tolerated treatment well    Behavior During Therapy [facility] for tasks assessed/performed             Past Medical History:  Diagnosis Date   Absence of bladder continence 11/15/2014   Acquired hypothyroidism 04/12/2012   Adjustment disorder with anxiety 02/17/2013   patient states she has no known diagnosis of this, doesn't take medication for this, states her primary care doesn't even have it noted in their records   Arterial fibromuscular dysplasia (HCC) 08/25/2011   Arthralgia of both hands 11/22/2017   Benign renovascular hypertension 06/23/2011   Formatting of this note might be different from the original. right   Cephalalgia 11/15/2014   Chest pain 04/12/2012   Crohn's disease of ileum without complication (HCC) 03/30/2016   Elevated troponin    Esophageal dysphagia 01/13/2017   HTN (hypertension) 04/12/2012   Hyperlipemia 04/12/2012   Hyperlipidemia 04/12/2012   Hypothyroidism    IBS (irritable bowel syndrome)    Incontinence in female 11/12/2014   Left knee pain 10/14/2011   Parkinson's disease (HCC) 07/24/2019   Pes anserine bursitis 10/14/2011   Primary osteoarthritis involving multiple joints 11/25/2017   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(HCC)    RLS (restless legs syndrome) 10/10/2019   Thyroid nodule 04/12/2012   Tremor 10/10/2019   Past Surgical History:  Procedure Laterality Date   APPENDECTOMY     AUGMENTATION MAMMAPLASTY     saline - 2015   CHOLECYSTECTOMY     ECTOPIC PREGNANCY SURGERY     KNEE SURGERY     LEFT HEART CATH AND CORONARY ANGIOGRAPHY N/A 03/11/2021   Procedure: LEFT  HEART CATH AND CORONARY ANGIOGRAPHY;  Surgeon: Lennette Bihari, MD;  Location: [facility];  Service: Cardiovascular;  Laterality: N/A;   TONSILLECTOMY     Patient Active Problem List   Diagnosis Date Noted   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) 05/11/2021   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(HCC) 03/30/2021   IBS (irritable bowel syndrome) 03/12/2021   Elevated troponin    RLS (restless legs syndrome) 10/10/2019   Tremor 10/10/2019   Parkinson's disease (HCC) 07/24/2019   Primary osteoarthritis involving multiple joints 11/25/2017   Arthralgia of both hands 11/22/2017   Esophageal dysphagia 01/13/2017   Crohn's disease of ileum without complication (HCC) 03/30/2016   Absence of bladder continence 11/15/2014   Cephalalgia 11/15/2014   Incontinence in female 11/12/2014   Adjustment disorder with anxiety 02/17/2013   Chest pain 04/12/2012   HTN (hypertension) 04/12/2012   Hypothyroidism 04/12/2012   Hyperlipemia 04/12/2012   Thyroid nodule 04/12/2012   Hyperlipidemia 04/12/2012   Acquired hypothyroidism 04/12/2012   Left knee pain 10/14/2011   Pes anserine bursitis 10/14/2011   Arterial fibromuscular dysplasia (HCC) 08/25/2011   Benign renovascular hypertension 06/23/2011    PCP: Malka So., MD   REFERRING PROVIDER: Beverely Low, MD   REFERRING DIAG: [phone] (ICD-10-CM) - Other specified arthritis, right knee   THERAPY DIAG:  Difficulty in walking, not elsewhere classified  Localized edema  Unsteadiness on feet  Muscle weakness (generalized)  Stiffness of right knee, not elsewhere classified  Rationale for Evaluation and Treatment: Rehabilitation  ONSET DATE: 06/13/23  SUBJECTIVE:   SUBJECTIVE STATEMENT: "Im good, feeling a lot better than I did Monday"  PERTINENT HISTORY: PMHx: tremors, OA, HTN, R TKR 06/13/23 PAIN:  Are you having pain? Yes: NPRS scale: 3/10 Pain location: knee Pain description: Sharp, aching Aggravating factors:  Being still and then trying to move Relieving factors: ice, pain meds.  PRECAUTIONS: Knee  RED FLAGS: None   WEIGHT BEARING RESTRICTIONS: Yes WBAT  FALLS:  Has patient fallen in last 6 months? No  LIVING ENVIRONMENT: Lives with: lives with their family Lives in: House/apartment Stairs: Yes: Internal: 14 steps; on right going up and External: 1 steps; none Has following equipment at home: Dan Humphreys - 2 wheeled  OCCUPATION: Works in an office. Mostly seated.  PLOF: Independent  PATIENT GOALS: Patient would like to recover her ROM and strength. Get back to golf, swimming, diving.  NEXT MD VISIT: Next Thursday, 10/10  OBJECTIVE:  Note: Objective measures were completed at Evaluation unless otherwise noted.  DIAGNOSTIC FINDINGS: N/A  COGNITION: Overall cognitive status: Within functional limits for tasks assessed     SENSATION: WFL  EDEMA:  Normal edema noted.  POSTURE: slightly flexed over walker, decreased WB through RLE  PALPATION: R LE taught throughout foot to thigh, tender points where skin is drawn tight, improved with gentle retrograde massage.  INCISION: clean, dry, no redness noted. Waterproof dressing applied and ace wraps re-applied per patient request vs the TED hose.  LOWER EXTREMITY ROM: Grossly WNL except R knee  Active ROM Right eval Left eval R knee 06/29/23  Hip flexion     Hip extension     Hip abduction     Hip adduction     Hip internal rotation     Hip external rotation     Knee flexion   10  Knee extension 21 88 101  Ankle dorsiflexion     Ankle plantarflexion     Ankle inversion     Ankle eversion      (Blank rows = not tested)  LOWER EXTREMITY MMT: L 5/5  MMT Right eval Left eval  Hip flexion 3+   Hip extension 3+   Hip abduction 3+   Hip adduction    Hip internal rotation    Hip external rotation    Knee flexion 3   Knee extension 3   Ankle dorsiflexion 3+   Ankle plantarflexion    Ankle inversion    Ankle eversion      GAIT: Distance walked: In clinic distances Assistive device utilized: Walker - 2 wheeled Level of assistance: Modified independence Comments: Patient walks with step through gait pattern decreased R stance and decreased WB trough RLE   TODAY'S TREATMENT:  DATE:  07/06/23 NuStep L 5x 5 min R knee PROM wih end range holds  Patella mobs  Tibiofemoral mobs 6in step ups x10 each Leg press 40lb 2x10, RLE TKE 20lb 2x5 HS curls 20lb 2x10 Leg Ext 5lb 2x10 Vas R knee low pressure x 10 min  07/04/23 NuStep L 4 x 4 min Bike partial & full Revolutions L1  x3 min  Resisted gait 20lb 4 way x3 each Heel raises black bar 2x15 4in step ups x10 R knee PROM wih end range holds  Patella mobs  Tibiofemoral mobs Vas R knee low pressure x 10 min  06/29/23 NuStep L 4 x 5 min Bike partial & full Revolutions L1  x5 min  Resisted gait 20lb 4 way x3 each Leg press 20lb 2x10 R knee PROM wih end range holds  Patella mobs  Tibiofemoral mobs Vas R knee low pressure x 10 min  06/27/23 Bike partial & full Revolutions L1  x5 min  Gait outside around widest part of back parking lot S2S 2x10 R knee PROM wih end range holds  Patella mobs  Tibiofemoral mobs  HS curls red 2x10 Vas R knee low pressure x 10 min  06/22/23 R knee PROM wih end range holds  Patella mobs  Tibiofemoral mobs NuStep L5 x5 min HS curls red 2x10 S2S 3x5 slightly elevated mat  LAQ 2lb 2x10 4in step ups RLE x 5 2 rails Vas R knee low pressure x 10 min   PATIENT EDUCATION:  Education details: POC, review HEP Person educated: Patient Education method: Explanation Education comprehension: verbalized understanding  HOME EXERCISE PROGRAM:  [id number]  ASSESSMENT:  CLINICAL IMPRESSION: Patient is a 72 y.o. who was seen today for physical therapy  treatment for PT S/P R TKR 06/13/23. Pt enters  doing well ambulating without AD.  Springy end feel with L knee Passive flexion, pain limiting factor. Contract relax technique was helpful when gaining R knee extension.Progressed today's session with functional and machine level interventions. Cues for full TKE needed on leg press and with leg extensions. She will benefit from PT to facilitate progression through the TKR rehab process to be able to return to her PLOF.   OBJECTIVE IMPAIRMENTS: Abnormal gait, decreased activity tolerance, decreased balance, decreased coordination, decreased endurance, difficulty walking, decreased ROM, decreased strength, increased muscle spasms, impaired flexibility, improper body mechanics, postural dysfunction, and pain.   ACTIVITY LIMITATIONS: lifting, bending, sitting, standing, squatting, stairs, transfers, bed mobility, and locomotion level  PARTICIPATION LIMITATIONS: meal prep, cleaning, laundry, driving, shopping, and community activity  PERSONAL FACTORS: Past/current experiences are also affecting patient's functional outcome.   REHAB POTENTIAL: Good  CLINICAL DECISION MAKING: Evolving/moderate complexity  EVALUATION COMPLEXITY: Moderate   GOALS: Goals reviewed with patient? Yes  SHORT TERM GOALS: Target date: 07/01/23 I with initial HEP Baseline: Goal status: Met 06/29/23  LONG TERM GOALS: Target date: 09/08/23  I with final HEP Baseline:  Goal status: INITIAL  2.  Increase R knee AROM to 0-120 Baseline:  Goal status: Ongoing 07/06/23  3.  Increase R knee strength to at least 4+/5 Baseline:  Goal status: INITIAL  4.  Patient will be able to walk x at least 400' MI, with LRAD, on level and unlevel surfaces. Baseline:  Goal status: Progressing 06/29/23  5.  Patient will ambulate up and down at least 12 steps using U rail and step over step, MI Baseline:  Goal status: Progressing 07/06/23  6.  Patient will report initiation of return to golf with putting  and practice swings at  home, without pain or swelling. Baseline:  Goal status: Ongoing 07/06/23   PLAN:  PT FREQUENCY: 2-3x/week  PT DURATION: 12 weeks  PLANNED INTERVENTIONS: Therapeutic exercises, Therapeutic activity, Neuromuscular re-education, Balance training, Gait training, Patient/Family education, Self Care, Joint mobilization, Stair training, Electrical stimulation, Cryotherapy, Moist heat, scar mobilization, Taping, Vasopneumatic device, Ionotophoresis 4mg /ml Dexamethasone, and Manual therapy  PLAN FOR NEXT SESSION: Progress strength and ROM activities   Debroah Baller, PTA 07/06/2023, 4:06 PM

## 2023-07-08 ENCOUNTER — Ambulatory Visit: Payer: Medicare Other | Admitting: Physical Therapy

## 2023-07-08 ENCOUNTER — Encounter: Payer: Medicare Other | Admitting: Physical Therapy

## 2023-07-11 ENCOUNTER — Ambulatory Visit: Payer: Medicare Other | Admitting: Physical Therapy

## 2023-07-11 ENCOUNTER — Encounter: Payer: Medicare Other | Admitting: Physical Therapy

## 2023-07-12 ENCOUNTER — Encounter: Payer: Self-pay | Admitting: Physical Therapy

## 2023-07-12 ENCOUNTER — Ambulatory Visit: Payer: Medicare Other | Admitting: Physical Therapy

## 2023-07-12 DIAGNOSIS — R600 Localized edema: Secondary | ICD-10-CM

## 2023-07-12 DIAGNOSIS — M6281 Muscle weakness (generalized): Secondary | ICD-10-CM

## 2023-07-12 DIAGNOSIS — M25661 Stiffness of right knee, not elsewhere classified: Secondary | ICD-10-CM

## 2023-07-12 DIAGNOSIS — R2681 Unsteadiness on feet: Secondary | ICD-10-CM

## 2023-07-12 DIAGNOSIS — R262 Difficulty in walking, not elsewhere classified: Secondary | ICD-10-CM | POA: Diagnosis not present

## 2023-07-12 NOTE — Therapy (Signed)
OUTPATIENT PHYSICAL THERAPY LOWER EXTREMITY EVALUATION   Patient Name: Melinda Lane MRN: [id number] DOB:08/26/1951, 72 y.o., female Today's Date: 07/12/2023  END OF SESSION:  PT End of Session - 07/12/23 1431     Visit Number 8    Date for PT Re-Evaluation 09/08/23    PT Start Time 1430    PT Stop Time 1515    PT Time Calculation (min) 45 min    Activity Tolerance Patient tolerated treatment well    Behavior During Therapy [facility] for tasks assessed/performed             Past Medical History:  Diagnosis Date   Absence of bladder continence 11/15/2014   Acquired hypothyroidism 04/12/2012   Adjustment disorder with anxiety 02/17/2013   patient states she has no known diagnosis of this, doesn't take medication for this, states her primary care doesn't even have it noted in their records   Arterial fibromuscular dysplasia (HCC) 08/25/2011   Arthralgia of both hands 11/22/2017   Benign renovascular hypertension 06/23/2011   Formatting of this note might be different from the original. right   Cephalalgia 11/15/2014   Chest pain 04/12/2012   Crohn's disease of ileum without complication (HCC) 03/30/2016   Elevated troponin    Esophageal dysphagia 01/13/2017   HTN (hypertension) 04/12/2012   Hyperlipemia 04/12/2012   Hyperlipidemia 04/12/2012   Hypothyroidism    IBS (irritable bowel syndrome)    Incontinence in female 11/12/2014   Left knee pain 10/14/2011   Parkinson's disease (HCC) 07/24/2019   Pes anserine bursitis 10/14/2011   Primary osteoarthritis involving multiple joints 11/25/2017   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(HCC)    RLS (restless legs syndrome) 10/10/2019   Thyroid nodule 04/12/2012   Tremor 10/10/2019   Past Surgical History:  Procedure Laterality Date   APPENDECTOMY     AUGMENTATION MAMMAPLASTY     saline - 2015   CHOLECYSTECTOMY     ECTOPIC PREGNANCY SURGERY     KNEE SURGERY     LEFT HEART CATH AND CORONARY ANGIOGRAPHY N/A 03/11/2021   Procedure: LEFT  HEART CATH AND CORONARY ANGIOGRAPHY;  Surgeon: Lennette Bihari, MD;  Location: [facility];  Service: Cardiovascular;  Laterality: N/A;   TONSILLECTOMY     Patient Active Problem List   Diagnosis Date Noted   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) 05/11/2021   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(HCC) 03/30/2021   IBS (irritable bowel syndrome) 03/12/2021   Elevated troponin    RLS (restless legs syndrome) 10/10/2019   Tremor 10/10/2019   Parkinson's disease (HCC) 07/24/2019   Primary osteoarthritis involving multiple joints 11/25/2017   Arthralgia of both hands 11/22/2017   Esophageal dysphagia 01/13/2017   Crohn's disease of ileum without complication (HCC) 03/30/2016   Absence of bladder continence 11/15/2014   Cephalalgia 11/15/2014   Incontinence in female 11/12/2014   Adjustment disorder with anxiety 02/17/2013   Chest pain 04/12/2012   HTN (hypertension) 04/12/2012   Hypothyroidism 04/12/2012   Hyperlipemia 04/12/2012   Thyroid nodule 04/12/2012   Hyperlipidemia 04/12/2012   Acquired hypothyroidism 04/12/2012   Left knee pain 10/14/2011   Pes anserine bursitis 10/14/2011   Arterial fibromuscular dysplasia (HCC) 08/25/2011   Benign renovascular hypertension 06/23/2011    PCP: Malka So., MD   REFERRING PROVIDER: Beverely Low, MD   REFERRING DIAG: [phone] (ICD-10-CM) - Other specified arthritis, right knee   THERAPY DIAG:  Difficulty in walking, not elsewhere classified  Localized edema  Unsteadiness on feet  Muscle weakness (generalized)  Stiffness of right knee, not elsewhere classified  Rationale for Evaluation and Treatment: Rehabilitation  ONSET DATE: 06/13/23  SUBJECTIVE:   SUBJECTIVE STATEMENT: "Good"  PERTINENT HISTORY: PMHx: tremors, OA, HTN, R TKR 06/13/23 PAIN:  Are you having pain? Yes: NPRS scale: 2/10 Pain location: knee Pain description: Sharp, aching Aggravating factors: Being still and then trying to move Relieving  factors: ice, pain meds.  PRECAUTIONS: Knee  RED FLAGS: None   WEIGHT BEARING RESTRICTIONS: Yes WBAT  FALLS:  Has patient fallen in last 6 months? No  LIVING ENVIRONMENT: Lives with: lives with their family Lives in: House/apartment Stairs: Yes: Internal: 14 steps; on right going up and External: 1 steps; none Has following equipment at home: Dan Humphreys - 2 wheeled  OCCUPATION: Works in an office. Mostly seated.  PLOF: Independent  PATIENT GOALS: Patient would like to recover her ROM and strength. Get back to golf, swimming, diving.  NEXT MD VISIT: Next Thursday, 10/10  OBJECTIVE:  Note: Objective measures were completed at Evaluation unless otherwise noted.  DIAGNOSTIC FINDINGS: N/A  COGNITION: Overall cognitive status: Within functional limits for tasks assessed     SENSATION: WFL  EDEMA:  Normal edema noted.  POSTURE: slightly flexed over walker, decreased WB through RLE  PALPATION: R LE taught throughout foot to thigh, tender points where skin is drawn tight, improved with gentle retrograde massage.  INCISION: clean, dry, no redness noted. Waterproof dressing applied and ace wraps re-applied per patient request vs the TED hose.  LOWER EXTREMITY ROM: Grossly WNL except R knee  Active ROM Right eval Left eval R knee 06/29/23  Hip flexion     Hip extension     Hip abduction     Hip adduction     Hip internal rotation     Hip external rotation     Knee flexion   10  Knee extension 21 88 101  Ankle dorsiflexion     Ankle plantarflexion     Ankle inversion     Ankle eversion      (Blank rows = not tested)  LOWER EXTREMITY MMT: L 5/5  MMT Right eval Left eval  Hip flexion 3+   Hip extension 3+   Hip abduction 3+   Hip adduction    Hip internal rotation    Hip external rotation    Knee flexion 3   Knee extension 3   Ankle dorsiflexion 3+   Ankle plantarflexion    Ankle inversion    Ankle eversion     GAIT: Distance walked: In clinic  distances Assistive device utilized: Walker - 2 wheeled Level of assistance: Modified independence Comments: Patient walks with step through gait pattern decreased R stance and decreased WB trough RLE   TODAY'S TREATMENT:                                                                                                                              DATE:  07/12/23 NuStep L  5x 5 min LE only Bike  L1  x3 min  R knee PROM wih end range holds  Patella mobs  Tibiofemoral mobs S2S holding yellow ball 2x10 Leg press 40lb 2x10, RLE TKE 20lb 2x5 LAQ RLE 3lb 2x10 Vas R knee low pressure x 10 min  07/06/23 NuStep L 5x 5 min R knee PROM wih end range holds  Patella mobs  Tibiofemoral mobs 6in step ups x10 each Leg press 40lb 2x10, RLE TKE 20lb 2x5 HS curls 20lb 2x10 Leg Ext 5lb 2x10 Vas R knee low pressure x 10 min  07/04/23 NuStep L 4 x 4 min Bike partial & full Revolutions L1  x3 min  Resisted gait 20lb 4 way x3 each Heel raises black bar 2x15 4in step ups x10 R knee PROM wih end range holds  Patella mobs  Tibiofemoral mobs Vas R knee low pressure x 10 min  06/29/23 NuStep L 4 x 5 min Bike partial & full Revolutions L1  x5 min  Resisted gait 20lb 4 way x3 each Leg press 20lb 2x10 R knee PROM wih end range holds  Patella mobs  Tibiofemoral mobs Vas R knee low pressure x 10 min  06/27/23 Bike partial & full Revolutions L1  x5 min  Gait outside around widest part of back parking lot S2S 2x10 R knee PROM wih end range holds  Patella mobs  Tibiofemoral mobs  HS curls red 2x10 Vas R knee low pressure x 10 min  06/22/23 R knee PROM wih end range holds  Patella mobs  Tibiofemoral mobs NuStep L5 x5 min HS curls red 2x10 S2S 3x5 slightly elevated mat  LAQ 2lb 2x10 4in step ups RLE x 5 2 rails Vas R knee low pressure x 10 min   PATIENT EDUCATION:  Education details: POC, review HEP Person educated: Patient Education method: Explanation Education comprehension:  verbalized understanding  HOME EXERCISE PROGRAM:  [id number]  ASSESSMENT:  CLINICAL IMPRESSION: Patient is a 72 y.o. who was seen today for physical therapy  treatment for PT S/P R TKR 06/13/23. Pt enters doing well ambulating without AD.  Springy end feel with L knee Passive flexion and extension. Pt does have less pain with passive ext. Pt abe to make full revolution on bike for the entire duration of the interventions. Contract relax technique was helpful when gaining R knee extension. Cues for full TKE needed on leg press and LAQ. Pt reports improved function overall.She will benefit from PT to facilitate progression through the TKR rehab process to be able to return to her PLOF.   OBJECTIVE IMPAIRMENTS: Abnormal gait, decreased activity tolerance, decreased balance, decreased coordination, decreased endurance, difficulty walking, decreased ROM, decreased strength, increased muscle spasms, impaired flexibility, improper body mechanics, postural dysfunction, and pain.   ACTIVITY LIMITATIONS: lifting, bending, sitting, standing, squatting, stairs, transfers, bed mobility, and locomotion level  PARTICIPATION LIMITATIONS: meal prep, cleaning, laundry, driving, shopping, and community activity  PERSONAL FACTORS: Past/current experiences are also affecting patient's functional outcome.   REHAB POTENTIAL: Good  CLINICAL DECISION MAKING: Evolving/moderate complexity  EVALUATION COMPLEXITY: Moderate   GOALS: Goals reviewed with patient? Yes  SHORT TERM GOALS: Target date: 07/01/23 I with initial HEP Baseline: Goal status: Met 06/29/23  LONG TERM GOALS: Target date: 09/08/23  I with final HEP Baseline:  Goal status: INITIAL  2.  Increase R knee AROM to 0-120 Baseline:  Goal status: Ongoing 07/06/23  3.  Increase R knee strength to at least 4+/5 Baseline:  Goal status: INITIAL  4.  Patient will be able to walk x at least 400' MI, with LRAD, on level and unlevel  surfaces. Baseline:  Goal status: Progressing 06/29/23  5.  Patient will ambulate up and down at least 12 steps using U rail and step over step, MI Baseline:  Goal status: Progressing 07/06/23  6.  Patient will report initiation of return to golf with putting and practice swings at home, without pain or swelling. Baseline:  Goal status: Ongoing 07/06/23   PLAN:  PT FREQUENCY: 2-3x/week  PT DURATION: 12 weeks  PLANNED INTERVENTIONS: Therapeutic exercises, Therapeutic activity, Neuromuscular re-education, Balance training, Gait training, Patient/Family education, Self Care, Joint mobilization, Stair training, Electrical stimulation, Cryotherapy, Moist heat, scar mobilization, Taping, Vasopneumatic device, Ionotophoresis 4mg /ml Dexamethasone, and Manual therapy  PLAN FOR NEXT SESSION: Progress strength and ROM activities   Debroah Baller, PTA 07/12/2023, 2:31 PM

## 2023-07-13 ENCOUNTER — Ambulatory Visit: Payer: Medicare Other | Admitting: Physical Therapy

## 2023-07-14 ENCOUNTER — Encounter: Payer: Self-pay | Admitting: Physical Therapy

## 2023-07-14 ENCOUNTER — Ambulatory Visit: Payer: Medicare Other | Admitting: Physical Therapy

## 2023-07-14 DIAGNOSIS — M25661 Stiffness of right knee, not elsewhere classified: Secondary | ICD-10-CM

## 2023-07-14 DIAGNOSIS — R600 Localized edema: Secondary | ICD-10-CM

## 2023-07-14 DIAGNOSIS — R2681 Unsteadiness on feet: Secondary | ICD-10-CM

## 2023-07-14 DIAGNOSIS — R262 Difficulty in walking, not elsewhere classified: Secondary | ICD-10-CM | POA: Diagnosis not present

## 2023-07-14 DIAGNOSIS — M6281 Muscle weakness (generalized): Secondary | ICD-10-CM

## 2023-07-14 NOTE — Therapy (Signed)
OUTPATIENT PHYSICAL THERAPY LOWER EXTREMITY TREATMENT   Patient Name: Maridell Volmer MRN: [id number] DOB:09/27/1950, 72 y.o., female Today's Date: 07/14/2023  END OF SESSION:  PT End of Session - 07/14/23 1520     Visit Number 9    Date for PT Re-Evaluation 09/08/23    PT Start Time 1520    PT Stop Time 1600    PT Time Calculation (min) 40 min    Activity Tolerance Patient tolerated treatment well    Behavior During Therapy [facility] for tasks assessed/performed             Past Medical History:  Diagnosis Date   Absence of bladder continence 11/15/2014   Acquired hypothyroidism 04/12/2012   Adjustment disorder with anxiety 02/17/2013   patient states she has no known diagnosis of this, doesn't take medication for this, states her primary care doesn't even have it noted in their records   Arterial fibromuscular dysplasia (HCC) 08/25/2011   Arthralgia of both hands 11/22/2017   Benign renovascular hypertension 06/23/2011   Formatting of this note might be different from the original. right   Cephalalgia 11/15/2014   Chest pain 04/12/2012   Crohn's disease of ileum without complication (HCC) 03/30/2016   Elevated troponin    Esophageal dysphagia 01/13/2017   HTN (hypertension) 04/12/2012   Hyperlipemia 04/12/2012   Hyperlipidemia 04/12/2012   Hypothyroidism    IBS (irritable bowel syndrome)    Incontinence in female 11/12/2014   Left knee pain 10/14/2011   Parkinson's disease (HCC) 07/24/2019   Pes anserine bursitis 10/14/2011   Primary osteoarthritis involving multiple joints 11/25/2017   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(HCC)    RLS (restless legs syndrome) 10/10/2019   Thyroid nodule 04/12/2012   Tremor 10/10/2019   Past Surgical History:  Procedure Laterality Date   APPENDECTOMY     AUGMENTATION MAMMAPLASTY     saline - 2015   CHOLECYSTECTOMY     ECTOPIC PREGNANCY SURGERY     KNEE SURGERY     LEFT HEART CATH AND CORONARY ANGIOGRAPHY N/A 03/11/2021   Procedure: LEFT HEART  CATH AND CORONARY ANGIOGRAPHY;  Surgeon: Lennette Bihari, MD;  Location: [facility];  Service: Cardiovascular;  Laterality: N/A;   TONSILLECTOMY     Patient Active Problem List   Diagnosis Date Noted   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) 05/11/2021   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(HCC) 03/30/2021   IBS (irritable bowel syndrome) 03/12/2021   Elevated troponin    RLS (restless legs syndrome) 10/10/2019   Tremor 10/10/2019   Parkinson's disease (HCC) 07/24/2019   Primary osteoarthritis involving multiple joints 11/25/2017   Arthralgia of both hands 11/22/2017   Esophageal dysphagia 01/13/2017   Crohn's disease of ileum without complication (HCC) 03/30/2016   Absence of bladder continence 11/15/2014   Cephalalgia 11/15/2014   Incontinence in female 11/12/2014   Adjustment disorder with anxiety 02/17/2013   Chest pain 04/12/2012   HTN (hypertension) 04/12/2012   Hypothyroidism 04/12/2012   Hyperlipemia 04/12/2012   Thyroid nodule 04/12/2012   Hyperlipidemia 04/12/2012   Acquired hypothyroidism 04/12/2012   Left knee pain 10/14/2011   Pes anserine bursitis 10/14/2011   Arterial fibromuscular dysplasia (HCC) 08/25/2011   Benign renovascular hypertension 06/23/2011    PCP: Malka So., MD   REFERRING PROVIDER: Beverely Low, MD   REFERRING DIAG: [phone] (ICD-10-CM) - Other specified arthritis, right knee   THERAPY DIAG:  Difficulty in walking, not elsewhere classified  Localized edema  Unsteadiness on feet  Muscle weakness (generalized)  Stiffness of right knee, not elsewhere classified  Rationale for Evaluation and Treatment: Rehabilitation  ONSET DATE: 06/13/23  SUBJECTIVE:   SUBJECTIVE STATEMENT: "Good"  PERTINENT HISTORY: PMHx: tremors, OA, HTN, R TKR 06/13/23 PAIN:  Are you having pain? Yes: NPRS scale: 2/10 Pain location: knee Pain description: Sharp, aching Aggravating factors: Being still and then trying to move Relieving  factors: ice, pain meds.  PRECAUTIONS: Knee  RED FLAGS: None   WEIGHT BEARING RESTRICTIONS: Yes WBAT  FALLS:  Has patient fallen in last 6 months? No  LIVING ENVIRONMENT: Lives with: lives with their family Lives in: House/apartment Stairs: Yes: Internal: 14 steps; on right going up and External: 1 steps; none Has following equipment at home: Dan Humphreys - 2 wheeled  OCCUPATION: Works in an office. Mostly seated.  PLOF: Independent  PATIENT GOALS: Patient would like to recover her ROM and strength. Get back to golf, swimming, diving.  NEXT MD VISIT: Next Thursday, 10/10  OBJECTIVE:  Note: Objective measures were completed at Evaluation unless otherwise noted.  DIAGNOSTIC FINDINGS: N/A  COGNITION: Overall cognitive status: Within functional limits for tasks assessed     SENSATION: WFL  EDEMA:  Normal edema noted.  POSTURE: slightly flexed over walker, decreased WB through RLE  PALPATION: R LE taught throughout foot to thigh, tender points where skin is drawn tight, improved with gentle retrograde massage.  INCISION: clean, dry, no redness noted. Waterproof dressing applied and ace wraps re-applied per patient request vs the TED hose.  LOWER EXTREMITY ROM: Grossly WNL except R knee  Active ROM Right eval Left eval R knee 06/29/23 R Knee 07/14/23  Hip flexion      Hip extension      Hip abduction      Hip adduction      Hip internal rotation      Hip external rotation      Knee flexion   10 8  Knee extension 21 88 101 120  Ankle dorsiflexion      Ankle plantarflexion      Ankle inversion      Ankle eversion       (Blank rows = not tested)  LOWER EXTREMITY MMT: L 5/5  MMT Right eval Right 07/14/23  Hip flexion 3+   Hip extension 3+   Hip abduction 3+   Hip adduction    Hip internal rotation    Hip external rotation    Knee flexion 3 4+  Knee extension 3 4  Ankle dorsiflexion 3+   Ankle plantarflexion    Ankle inversion    Ankle eversion      GAIT: Distance walked: In clinic distances Assistive device utilized: Walker - 2 wheeled Level of assistance: Modified independence Comments: Patient walks with step through gait pattern decreased R stance and decreased WB trough RLE   TODAY'S TREATMENT:  DATE:  07/14/23 NuStep L 4x 4 min LE only Bike L1 x 3 min R knee PROM wih end range holds  Patella mobs  Tibiofemoral mobs RLE Step ups 6in 2x10 HS curls 20lb 2x10, RLE 15lb x10 RLE 5lb x5 Vas R knee low pressure x 10 min  07/12/23 NuStep L 5x 5 min LE only Bike  L1  x3 min  R knee PROM wih end range holds  Patella mobs  Tibiofemoral mobs S2S holding yellow ball 2x10 Leg press 40lb 2x10, RLE TKE 20lb 2x5 LAQ RLE 3lb 2x10 Vas R knee low pressure x 10 min  07/06/23 NuStep L 5x 5 min R knee PROM wih end range holds  Patella mobs  Tibiofemoral mobs 6in step ups x10 each Leg press 40lb 2x10, RLE TKE 20lb 2x5 HS curls 20lb 2x10 Leg Ext 5lb 2x10 Vas R knee low pressure x 10 min  07/04/23 NuStep L 4 x 4 min Bike partial & full Revolutions L1  x3 min  Resisted gait 20lb 4 way x3 each Heel raises black bar 2x15 4in step ups x10 R knee PROM wih end range holds  Patella mobs  Tibiofemoral mobs Vas R knee low pressure x 10 min  06/29/23 NuStep L 4 x 5 min Bike partial & full Revolutions L1  x5 min  Resisted gait 20lb 4 way x3 each Leg press 20lb 2x10 R knee PROM wih end range holds  Patella mobs  Tibiofemoral mobs Vas R knee low pressure x 10 min  06/27/23 Bike partial & full Revolutions L1  x5 min  Gait outside around widest part of back parking lot S2S 2x10 R knee PROM wih end range holds  Patella mobs  Tibiofemoral mobs  HS curls red 2x10 Vas R knee low pressure x 10 min  06/22/23 R knee PROM wih end range holds  Patella mobs  Tibiofemoral mobs NuStep L5 x5 min HS curls red  2x10 S2S 3x5 slightly elevated mat  LAQ 2lb 2x10 4in step ups RLE x 5 2 rails Vas R knee low pressure x 10 min   PATIENT EDUCATION:  Education details: POC, review HEP Person educated: Patient Education method: Explanation Education comprehension: verbalized understanding  HOME EXERCISE PROGRAM:  [id number]  ASSESSMENT:  CLINICAL IMPRESSION: Patient is a 72 y.o. who was seen today for physical therapy  treatment for PT S/P R TKR 06/13/23. Pt enters doing well ambulating without AD.She has progressed increasing her R knee AROM and strength.She is still limited with R knee Ext. Cue to prevent compensation with step ups. Contract relax technique was helpful when gaining R knee extension. Cues for full TKE needed when doing leg ext. Pt reports improved function overall.She will benefit from PT to facilitate progression through the TKR rehab process to be able to return to her PLOF.   OBJECTIVE IMPAIRMENTS: Abnormal gait, decreased activity tolerance, decreased balance, decreased coordination, decreased endurance, difficulty walking, decreased ROM, decreased strength, increased muscle spasms, impaired flexibility, improper body mechanics, postural dysfunction, and pain.   ACTIVITY LIMITATIONS: lifting, bending, sitting, standing, squatting, stairs, transfers, bed mobility, and locomotion level  PARTICIPATION LIMITATIONS: meal prep, cleaning, laundry, driving, shopping, and community activity  PERSONAL FACTORS: Past/current experiences are also affecting patient's functional outcome.   REHAB POTENTIAL: Good  CLINICAL DECISION MAKING: Evolving/moderate complexity  EVALUATION COMPLEXITY: Moderate   GOALS: Goals reviewed with patient? Yes  SHORT TERM GOALS: Target date: 07/01/23 I with initial HEP Baseline: Goal status: Met 06/29/23  LONG TERM GOALS: Target date: 09/08/23  I with final HEP Baseline:  Goal status: INITIAL  2.  Increase R knee AROM to 0-120 Baseline:  Goal  status: Ongoing 07/06/23  3.  Increase R knee strength to at least 4+/5 Baseline:  Goal status: INITIAL  4.  Patient will be able to walk x at least 400' MI, with LRAD, on level and unlevel surfaces. Baseline:  Goal status: Progressing 06/29/23  5.  Patient will ambulate up and down at least 12 steps using U rail and step over step, MI Baseline:  Goal status: Progressing 07/06/23  6.  Patient will report initiation of return to golf with putting and practice swings at home, without pain or swelling. Baseline:  Goal status: Ongoing 07/06/23   PLAN:  PT FREQUENCY: 2-3x/week  PT DURATION: 12 weeks  PLANNED INTERVENTIONS: Therapeutic exercises, Therapeutic activity, Neuromuscular re-education, Balance training, Gait training, Patient/Family education, Self Care, Joint mobilization, Stair training, Electrical stimulation, Cryotherapy, Moist heat, scar mobilization, Taping, Vasopneumatic device, Ionotophoresis 4mg /ml Dexamethasone, and Manual therapy  PLAN FOR NEXT SESSION: Progress strength and ROM activities   Debroah Baller, PTA 07/14/2023, 3:21 PM

## 2023-07-15 ENCOUNTER — Ambulatory Visit: Payer: Medicare Other | Admitting: Physical Therapy

## 2023-07-18 ENCOUNTER — Ambulatory Visit: Payer: Medicare Other | Attending: Orthopedic Surgery | Admitting: Physical Therapy

## 2023-07-18 ENCOUNTER — Encounter: Payer: Self-pay | Admitting: Physical Therapy

## 2023-07-18 DIAGNOSIS — M6281 Muscle weakness (generalized): Secondary | ICD-10-CM | POA: Diagnosis present

## 2023-07-18 DIAGNOSIS — R262 Difficulty in walking, not elsewhere classified: Secondary | ICD-10-CM | POA: Insufficient documentation

## 2023-07-18 DIAGNOSIS — R2681 Unsteadiness on feet: Secondary | ICD-10-CM | POA: Diagnosis present

## 2023-07-18 DIAGNOSIS — M25661 Stiffness of right knee, not elsewhere classified: Secondary | ICD-10-CM | POA: Insufficient documentation

## 2023-07-18 DIAGNOSIS — R600 Localized edema: Secondary | ICD-10-CM | POA: Insufficient documentation

## 2023-07-18 NOTE — Therapy (Signed)
OUTPATIENT PHYSICAL THERAPY LOWER EXTREMITY TREATMENT  Progress Note Reporting Period 06/16/23 to 07/18/23  See note below for Objective Data and Assessment of Progress/Goals.      Patient Name: Melinda Lane MRN: [id number] DOB:07/05/1951, 72 y.o., female Today's Date: 07/18/2023  END OF SESSION:  PT End of Session - 07/18/23 1559     Visit Number 10    Date for PT Re-Evaluation 09/08/23    PT Start Time 1600    PT Stop Time 1645    PT Time Calculation (min) 45 min    Activity Tolerance Patient tolerated treatment well    Behavior During Therapy [facility] for tasks assessed/performed             Past Medical History:  Diagnosis Date   Absence of bladder continence 11/15/2014   Acquired hypothyroidism 04/12/2012   Adjustment disorder with anxiety 02/17/2013   patient states she has no known diagnosis of this, doesn't take medication for this, states her primary care doesn't even have it noted in their records   Arterial fibromuscular dysplasia (HCC) 08/25/2011   Arthralgia of both hands 11/22/2017   Benign renovascular hypertension 06/23/2011   Formatting of this note might be different from the original. right   Cephalalgia 11/15/2014   Chest pain 04/12/2012   Crohn's disease of ileum without complication (HCC) 03/30/2016   Elevated troponin    Esophageal dysphagia 01/13/2017   HTN (hypertension) 04/12/2012   Hyperlipemia 04/12/2012   Hyperlipidemia 04/12/2012   Hypothyroidism    IBS (irritable bowel syndrome)    Incontinence in female 11/12/2014   Left knee pain 10/14/2011   Parkinson's disease (HCC) 07/24/2019   Pes anserine bursitis 10/14/2011   Primary osteoarthritis involving multiple joints 11/25/2017   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(HCC)    RLS (restless legs syndrome) 10/10/2019   Thyroid nodule 04/12/2012   Tremor 10/10/2019   Past Surgical History:  Procedure Laterality Date   APPENDECTOMY     AUGMENTATION MAMMAPLASTY     saline - 2015   CHOLECYSTECTOMY      ECTOPIC PREGNANCY SURGERY     KNEE SURGERY     LEFT HEART CATH AND CORONARY ANGIOGRAPHY N/A 03/11/2021   Procedure: LEFT HEART CATH AND CORONARY ANGIOGRAPHY;  Surgeon: Lennette Bihari, MD;  Location: [facility] INVASIVE [facility];  Service: Cardiovascular;  Laterality: N/A;   TONSILLECTOMY     Patient Active Problem List   Diagnosis Date Noted   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) 05/11/2021   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(HCC) 03/30/2021   IBS (irritable bowel syndrome) 03/12/2021   Elevated troponin    RLS (restless legs syndrome) 10/10/2019   Tremor 10/10/2019   Parkinson's disease (HCC) 07/24/2019   Primary osteoarthritis involving multiple joints 11/25/2017   Arthralgia of both hands 11/22/2017   Esophageal dysphagia 01/13/2017   Crohn's disease of ileum without complication (HCC) 03/30/2016   Absence of bladder continence 11/15/2014   Cephalalgia 11/15/2014   Incontinence in female 11/12/2014   Adjustment disorder with anxiety 02/17/2013   Chest pain 04/12/2012   HTN (hypertension) 04/12/2012   Hypothyroidism 04/12/2012   Hyperlipemia 04/12/2012   Thyroid nodule 04/12/2012   Hyperlipidemia 04/12/2012   Acquired hypothyroidism 04/12/2012   Left knee pain 10/14/2011   Pes anserine bursitis 10/14/2011   Arterial fibromuscular dysplasia (HCC) 08/25/2011   Benign renovascular hypertension 06/23/2011    PCP: Malka So., MD   REFERRING PROVIDER: Beverely Low, MD   REFERRING DIAG: [phone] (ICD-10-CM) - Other specified arthritis, right knee  THERAPY DIAG:  Difficulty in walking, not elsewhere classified  Unsteadiness on feet  Localized edema  Muscle weakness (generalized)  Stiffness of right knee, not elsewhere classified  Rationale for Evaluation and Treatment: Rehabilitation  ONSET DATE: 06/13/23  SUBJECTIVE:   SUBJECTIVE STATEMENT: "I am feeling good"  PERTINENT HISTORY: PMHx: tremors, OA, HTN, R TKR 06/13/23 PAIN:  Are you having pain? Yes:  NPRS scale: 1-2/10 Pain location: knee Pain description: Sharp, aching Aggravating factors: Being still and then trying to move Relieving factors: ice, pain meds.  PRECAUTIONS: Knee  RED FLAGS: None   WEIGHT BEARING RESTRICTIONS: Yes WBAT  FALLS:  Has patient fallen in last 6 months? No  LIVING ENVIRONMENT: Lives with: lives with their family Lives in: House/apartment Stairs: Yes: Internal: 14 steps; on right going up and External: 1 steps; none Has following equipment at home: Dan Humphreys - 2 wheeled  OCCUPATION: Works in an office. Mostly seated.  PLOF: Independent  PATIENT GOALS: Patient would like to recover her ROM and strength. Get back to golf, swimming, diving.  NEXT MD VISIT: Next Thursday, 10/10  OBJECTIVE:  Note: Objective measures were completed at Evaluation unless otherwise noted.  DIAGNOSTIC FINDINGS: N/A  COGNITION: Overall cognitive status: Within functional limits for tasks assessed     SENSATION: WFL  EDEMA:  Normal edema noted.  POSTURE: slightly flexed over walker, decreased WB through RLE  PALPATION: R LE taught throughout foot to thigh, tender points where skin is drawn tight, improved with gentle retrograde massage.  INCISION: clean, dry, no redness noted. Waterproof dressing applied and ace wraps re-applied per patient request vs the TED hose.  LOWER EXTREMITY ROM: Grossly WNL except R knee  Active ROM Right eval Left eval R knee 06/29/23 R Knee 07/14/23 R knee 07/18/23  Hip flexion       Hip extension       Hip abduction       Hip adduction       Hip internal rotation       Hip external rotation       Knee flexion   10 8 8   Knee extension 21 88 101 120 120  Ankle dorsiflexion       Ankle plantarflexion       Ankle inversion       Ankle eversion        (Blank rows = not tested)  LOWER EXTREMITY MMT: L 5/5  MMT Right eval Right 07/14/23 Right 07/18/23  Hip flexion 3+    Hip extension 3+    Hip abduction 3+    Hip adduction      Hip internal rotation     Hip external rotation     Knee flexion 3 4+ 4+  Knee extension 3 4 4   Ankle dorsiflexion 3+    Ankle plantarflexion     Ankle inversion     Ankle eversion      GAIT: Distance walked: In clinic distances Assistive device utilized: Walker - 2 wheeled Level of assistance: Modified independence Comments: Patient walks with step through gait pattern decreased R stance and decreased WB trough RLE   TODAY'S TREATMENT:  DATE:  07/18/23 Bike L 3.5 x6 min R knee PROM wih end range holds  Patella mobs  Tibiofemoral mobs Checked Goals  S2S holding yellow ball 2x10 HS curls 20lb 2x10, RLE 15lb x10 RLE 5lb x5, 10lb RLE eccentrics  6inch step ups  Leg press 40lb 2x15 Vas R knee low pressure x 10 min  07/14/23 NuStep L 4x 4 min LE only Bike L1 x 3 min R knee PROM wih end range holds  Patella mobs  Tibiofemoral mobs RLE Step ups 6in 2x10 HS curls 20lb 2x10, RLE 15lb x10 RLE 5lb x5 Vas R knee low pressure x 10 min  07/12/23 NuStep L 5x 5 min LE only Bike  L1  x3 min  R knee PROM wih end range holds  Patella mobs  Tibiofemoral mobs S2S holding yellow ball 2x10 Leg press 40lb 2x10, RLE TKE 20lb 2x5 LAQ RLE 3lb 2x10 Vas R knee low pressure x 10 min  07/06/23 NuStep L 5x 5 min R knee PROM wih end range holds  Patella mobs  Tibiofemoral mobs 6in step ups x10 each Leg press 40lb 2x10, RLE TKE 20lb 2x5 HS curls 20lb 2x10 Leg Ext 5lb 2x10 Vas R knee low pressure x 10 min  07/04/23 NuStep L 4 x 4 min Bike partial & full Revolutions L1  x3 min  Resisted gait 20lb 4 way x3 each Heel raises black bar 2x15 4in step ups x10 R knee PROM wih end range holds  Patella mobs  Tibiofemoral mobs Vas R knee low pressure x 10 min  06/29/23 NuStep L 4 x 5 min Bike partial & full Revolutions L1  x5 min  Resisted gait 20lb 4 way x3  each Leg press 20lb 2x10 R knee PROM wih end range holds  Patella mobs  Tibiofemoral mobs Vas R knee low pressure x 10 min  06/27/23 Bike partial & full Revolutions L1  x5 min  Gait outside around widest part of back parking lot S2S 2x10 R knee PROM wih end range holds  Patella mobs  Tibiofemoral mobs  HS curls red 2x10 Vas R knee low pressure x 10 min  06/22/23 R knee PROM wih end range holds  Patella mobs  Tibiofemoral mobs NuStep L5 x5 min HS curls red 2x10 S2S 3x5 slightly elevated mat  LAQ 2lb 2x10 4in step ups RLE x 5 2 rails Vas R knee low pressure x 10 min   PATIENT EDUCATION:  Education details: POC, review HEP Person educated: Patient Education method: Explanation Education comprehension: verbalized understanding  HOME EXERCISE PROGRAM:  [id number]  ASSESSMENT:  CLINICAL IMPRESSION: Patient is a 72 y.o. who was seen today for physical therapy  treatment for PT S/P R TKR 06/13/23. Pt enters doing well ambulating without AD. She has full R knee flexion but remains limited with extension. Cue to prevent compensation with step ups. Cues for full TKE needed when doing leg ext and step ups. She will benefit from PT to facilitate progression through the TKR rehab process to be able to return to her PLOF.   OBJECTIVE IMPAIRMENTS: Abnormal gait, decreased activity tolerance, decreased balance, decreased coordination, decreased endurance, difficulty walking, decreased ROM, decreased strength, increased muscle spasms, impaired flexibility, improper body mechanics, postural dysfunction, and pain.   ACTIVITY LIMITATIONS: lifting, bending, sitting, standing, squatting, stairs, transfers, bed mobility, and locomotion level  PARTICIPATION LIMITATIONS: meal prep, cleaning, laundry, driving, shopping, and community activity  PERSONAL FACTORS: Past/current experiences are also affecting patient's functional outcome.   REHAB POTENTIAL:  Good  CLINICAL DECISION MAKING:  Evolving/moderate complexity  EVALUATION COMPLEXITY: Moderate   GOALS: Goals reviewed with patient? Yes  SHORT TERM GOALS: Target date: 07/01/23 I with initial HEP Baseline: Goal status: Met 06/29/23  LONG TERM GOALS: Target date: 09/08/23  I with final HEP Baseline:  Goal status: INITIAL  2.  Increase R knee AROM to 0-120 Baseline:  Goal status: Partly Met 07/18/23  3.  Increase R knee strength to at least 4+/5 Baseline:  Goal status: Progressing 411/4/24  4.  Patient will be able to walk x at least 400' MI, with LRAD, on level and unlevel surfaces. Baseline:  Goal status: Progressing 06/29/23  5.  Patient will ambulate up and down at least 12 steps using U rail and step over step, MI Baseline:  Goal status: Progressing 07/06/23  6.  Patient will report initiation of return to golf with putting and practice swings at home, without pain or swelling. Baseline:  Goal status: Ongoing have not played 07/18/23   PLAN:  PT FREQUENCY: 2-3x/week  PT DURATION: 12 weeks  PLANNED INTERVENTIONS: Therapeutic exercises, Therapeutic activity, Neuromuscular re-education, Balance training, Gait training, Patient/Family education, Self Care, Joint mobilization, Stair training, Electrical stimulation, Cryotherapy, Moist heat, scar mobilization, Taping, Vasopneumatic device, Ionotophoresis 4mg /ml Dexamethasone, and Manual therapy  PLAN FOR NEXT SESSION: Progress strength and ROM activities  Oley Balm DPT 07/18/23 5:53 PM   Debroah Baller, PTA 07/18/2023, 4:00 PM

## 2023-07-20 ENCOUNTER — Encounter: Payer: Self-pay | Admitting: Physical Therapy

## 2023-07-20 ENCOUNTER — Ambulatory Visit: Payer: Medicare Other | Admitting: Physical Therapy

## 2023-07-20 DIAGNOSIS — R262 Difficulty in walking, not elsewhere classified: Secondary | ICD-10-CM

## 2023-07-20 DIAGNOSIS — M6281 Muscle weakness (generalized): Secondary | ICD-10-CM

## 2023-07-20 DIAGNOSIS — M25661 Stiffness of right knee, not elsewhere classified: Secondary | ICD-10-CM

## 2023-07-20 DIAGNOSIS — R600 Localized edema: Secondary | ICD-10-CM

## 2023-07-20 DIAGNOSIS — R2681 Unsteadiness on feet: Secondary | ICD-10-CM

## 2023-07-20 NOTE — Therapy (Signed)
OUTPATIENT PHYSICAL THERAPY LOWER EXTREMITY TREATMENT     Patient Name: Melinda Lane MRN: [id number] DOB:01-02-1951, 72 y.o., female Today's Date: 07/20/2023  END OF SESSION:  PT End of Session - 07/20/23 1518     Visit Number 11    Date for PT Re-Evaluation 09/08/23    PT Start Time 1518    PT Stop Time 1600    PT Time Calculation (min) 42 min    Activity Tolerance Patient tolerated treatment well    Behavior During Therapy [facility] for tasks assessed/performed             Past Medical History:  Diagnosis Date   Absence of bladder continence 11/15/2014   Acquired hypothyroidism 04/12/2012   Adjustment disorder with anxiety 02/17/2013   patient states she has no known diagnosis of this, doesn't take medication for this, states her primary care doesn't even have it noted in their records   Arterial fibromuscular dysplasia (HCC) 08/25/2011   Arthralgia of both hands 11/22/2017   Benign renovascular hypertension 06/23/2011   Formatting of this note might be different from the original. right   Cephalalgia 11/15/2014   Chest pain 04/12/2012   Crohn's disease of ileum without complication (HCC) 03/30/2016   Elevated troponin    Esophageal dysphagia 01/13/2017   HTN (hypertension) 04/12/2012   Hyperlipemia 04/12/2012   Hyperlipidemia 04/12/2012   Hypothyroidism    IBS (irritable bowel syndrome)    Incontinence in female 11/12/2014   Left knee pain 10/14/2011   Parkinson's disease (HCC) 07/24/2019   Pes anserine bursitis 10/14/2011   Primary osteoarthritis involving multiple joints 11/25/2017   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(HCC)    RLS (restless legs syndrome) 10/10/2019   Thyroid nodule 04/12/2012   Tremor 10/10/2019   Past Surgical History:  Procedure Laterality Date   APPENDECTOMY     AUGMENTATION MAMMAPLASTY     saline - 2015   CHOLECYSTECTOMY     ECTOPIC PREGNANCY SURGERY     KNEE SURGERY     LEFT HEART CATH AND CORONARY ANGIOGRAPHY N/A 03/11/2021   Procedure: LEFT  HEART CATH AND CORONARY ANGIOGRAPHY;  Surgeon: Lennette Bihari, MD;  Location: [facility] INVASIVE CV LAB;  Service: Cardiovascular;  Laterality: N/A;   TONSILLECTOMY     Patient Active Problem List   Diagnosis Date Noted   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) 05/11/2021   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(HCC) 03/30/2021   IBS (irritable bowel syndrome) 03/12/2021   Elevated troponin    RLS (restless legs syndrome) 10/10/2019   Tremor 10/10/2019   Parkinson's disease (HCC) 07/24/2019   Primary osteoarthritis involving multiple joints 11/25/2017   Arthralgia of both hands 11/22/2017   Esophageal dysphagia 01/13/2017   Crohn's disease of ileum without complication (HCC) 03/30/2016   Absence of bladder continence 11/15/2014   Cephalalgia 11/15/2014   Incontinence in female 11/12/2014   Adjustment disorder with anxiety 02/17/2013   Chest pain 04/12/2012   HTN (hypertension) 04/12/2012   Hypothyroidism 04/12/2012   Hyperlipemia 04/12/2012   Thyroid nodule 04/12/2012   Hyperlipidemia 04/12/2012   Acquired hypothyroidism 04/12/2012   Left knee pain 10/14/2011   Pes anserine bursitis 10/14/2011   Arterial fibromuscular dysplasia (HCC) 08/25/2011   Benign renovascular hypertension 06/23/2011    PCP: Malka So., MD   REFERRING PROVIDER: Beverely Low, MD   REFERRING DIAG: [phone] (ICD-10-CM) - Other specified arthritis, right knee   THERAPY DIAG:  Difficulty in walking, not elsewhere classified  Localized edema  Muscle weakness (generalized)  Stiffness of  right knee, not elsewhere classified  Unsteadiness on feet  Rationale for Evaluation and Treatment: Rehabilitation  ONSET DATE: 06/13/23  SUBJECTIVE:   SUBJECTIVE STATEMENT: Got a good report form the MD yesterday, doing better than most at this point  PERTINENT HISTORY: PMHx: tremors, OA, HTN, R TKR 06/13/23 PAIN:  Are you having pain? Yes: NPRS scale: 1/10 Pain location: knee Pain description: Sharp,  aching Aggravating factors: Being still and then trying to move Relieving factors: ice, pain meds.  PRECAUTIONS: Knee  RED FLAGS: None   WEIGHT BEARING RESTRICTIONS: Yes WBAT  FALLS:  Has patient fallen in last 6 months? No  LIVING ENVIRONMENT: Lives with: lives with their family Lives in: House/apartment Stairs: Yes: Internal: 14 steps; on right going up and External: 1 steps; none Has following equipment at home: Dan Humphreys - 2 wheeled  OCCUPATION: Works in an office. Mostly seated.  PLOF: Independent  PATIENT GOALS: Patient would like to recover her ROM and strength. Get back to golf, swimming, diving.  NEXT MD VISIT: Next Thursday, 10/10  OBJECTIVE:  Note: Objective measures were completed at Evaluation unless otherwise noted.  DIAGNOSTIC FINDINGS: N/A  COGNITION: Overall cognitive status: Within functional limits for tasks assessed     SENSATION: WFL  EDEMA:  Normal edema noted.  POSTURE: slightly flexed over walker, decreased WB through RLE  PALPATION: R LE taught throughout foot to thigh, tender points where skin is drawn tight, improved with gentle retrograde massage.  INCISION: clean, dry, no redness noted. Waterproof dressing applied and ace wraps re-applied per patient request vs the TED hose.  LOWER EXTREMITY ROM: Grossly WNL except R knee  Active ROM Right eval Left eval R knee 06/29/23 R Knee 07/14/23 R knee 07/18/23  Hip flexion       Hip extension       Hip abduction       Hip adduction       Hip internal rotation       Hip external rotation       Knee flexion   10 8 8   Knee extension 21 88 101 120 120  Ankle dorsiflexion       Ankle plantarflexion       Ankle inversion       Ankle eversion        (Blank rows = not tested)  LOWER EXTREMITY MMT: L 5/5  MMT Right eval Right 07/14/23 Right 07/18/23  Hip flexion 3+    Hip extension 3+    Hip abduction 3+    Hip adduction     Hip internal rotation     Hip external rotation     Knee  flexion 3 4+ 4+  Knee extension 3 4 4   Ankle dorsiflexion 3+    Ankle plantarflexion     Ankle inversion     Ankle eversion      GAIT: Distance walked: In clinic distances Assistive device utilized: Walker - 2 wheeled Level of assistance: Modified independence Comments: Patient walks with step through gait pattern decreased R stance and decreased WB trough RLE   TODAY'S TREATMENT:  DATE:  07/20/23 Bike L 2.5 x6 min Gait around back building widest part of lot brisk pace RLE lateral eccentric step downs 2x10 R knee PROM wih end range holds  Patella mobs  Tibiofemoral mobs Quad sets RLE x10 LAQ RLE 5lb 2x10 Leg press 30lb x20 Vas R knee low pressure x 10 min   07/18/23 Bike L 3.5 x6 min R knee PROM wih end range holds  Patella mobs  Tibiofemoral mobs Checked Goals  S2S holding yellow ball 2x10 HS curls 20lb 2x10, RLE 15lb x10 RLE 5lb x5, 10lb RLE eccentrics  6inch step ups  Leg press 40lb 2x15 Vas R knee low pressure x 10 min  07/14/23 NuStep L 4x 4 min LE only Bike L1 x 3 min R knee PROM wih end range holds  Patella mobs  Tibiofemoral mobs RLE Step ups 6in 2x10 HS curls 20lb 2x10, RLE 15lb x10 RLE 5lb x5 Vas R knee low pressure x 10 min  07/12/23 NuStep L 5x 5 min LE only Bike  L1  x3 min  R knee PROM wih end range holds  Patella mobs  Tibiofemoral mobs S2S holding yellow ball 2x10 Leg press 40lb 2x10, RLE TKE 20lb 2x5 LAQ RLE 3lb 2x10 Vas R knee low pressure x 10 min  07/06/23 NuStep L 5x 5 min R knee PROM wih end range holds  Patella mobs  Tibiofemoral mobs 6in step ups x10 each Leg press 40lb 2x10, RLE TKE 20lb 2x5 HS curls 20lb 2x10 Leg Ext 5lb 2x10 Vas R knee low pressure x 10 min  07/04/23 NuStep L 4 x 4 min Bike partial & full Revolutions L1  x3 min  Resisted gait 20lb 4 way x3 each Heel raises black bar  2x15 4in step ups x10 R knee PROM wih end range holds  Patella mobs  Tibiofemoral mobs Vas R knee low pressure x 10 min  06/29/23 NuStep L 4 x 5 min Bike partial & full Revolutions L1  x5 min  Resisted gait 20lb 4 way x3 each Leg press 20lb 2x10 R knee PROM wih end range holds  Patella mobs  Tibiofemoral mobs Vas R knee low pressure x 10 min  06/27/23 Bike partial & full Revolutions L1  x5 min  Gait outside around widest part of back parking lot S2S 2x10 R knee PROM wih end range holds  Patella mobs  Tibiofemoral mobs  HS curls red 2x10 Vas R knee low pressure x 10 min   PATIENT EDUCATION:  Education details: POC, review HEP Person educated: Patient Education method: Explanation Education comprehension: verbalized understanding  HOME EXERCISE PROGRAM:  [id number]  ASSESSMENT:  CLINICAL IMPRESSION: Patient is a 72 y.o. who was seen today for physical therapy  treatment for PT S/P R TKR 06/13/23. She enters with good news form MD. Continues to progress with R knee strength and ROM. Emphasize heel strike with outdoor ambulation. Eccentric load weakness with toe orf RLE. Instructed pt with LLLDS seated with R leg up and nothing under knee to allow gravity to aid with ext. She will benefit from PT to facilitate progression through the TKR rehab process to be able to return to her PLOF.   OBJECTIVE IMPAIRMENTS: Abnormal gait, decreased activity tolerance, decreased balance, decreased coordination, decreased endurance, difficulty walking, decreased ROM, decreased strength, increased muscle spasms, impaired flexibility, improper body mechanics, postural dysfunction, and pain.   ACTIVITY LIMITATIONS: lifting, bending, sitting, standing, squatting, stairs, transfers, bed mobility, and locomotion level  PARTICIPATION LIMITATIONS: meal prep, cleaning,  laundry, driving, shopping, and community activity  PERSONAL FACTORS: Past/current experiences are also affecting patient's functional  outcome.   REHAB POTENTIAL: Good  CLINICAL DECISION MAKING: Evolving/moderate complexity  EVALUATION COMPLEXITY: Moderate   GOALS: Goals reviewed with patient? Yes  SHORT TERM GOALS: Target date: 07/01/23 I with initial HEP Baseline: Goal status: Met 06/29/23  LONG TERM GOALS: Target date: 09/08/23  I with final HEP Baseline:  Goal status: INITIAL  2.  Increase R knee AROM to 0-120 Baseline:  Goal status: Partly Met 07/18/23  3.  Increase R knee strength to at least 4+/5 Baseline:  Goal status: Progressing 411/4/24  4.  Patient will be able to walk x at least 400' MI, with LRAD, on level and unlevel surfaces. Baseline:  Goal status: Progressing 06/29/23  5.  Patient will ambulate up and down at least 12 steps using U rail and step over step, MI Baseline:  Goal status: Progressing 07/06/23  6.  Patient will report initiation of return to golf with putting and practice swings at home, without pain or swelling. Baseline:  Goal status: Ongoing have not played 07/18/23   PLAN:  PT FREQUENCY: 2-3x/week  PT DURATION: 12 weeks  PLANNED INTERVENTIONS: Therapeutic exercises, Therapeutic activity, Neuromuscular re-education, Balance training, Gait training, Patient/Family education, Self Care, Joint mobilization, Stair training, Electrical stimulation, Cryotherapy, Moist heat, scar mobilization, Taping, Vasopneumatic device, Ionotophoresis 4mg /ml Dexamethasone, and Manual therapy  PLAN FOR NEXT SESSION: Progress strength and ROM activities  Oley Balm DPT 07/20/23 3:19 PM   Debroah Baller, PTA 07/20/2023, 3:19 PM

## 2023-07-21 ENCOUNTER — Encounter: Payer: Self-pay | Admitting: Neurology

## 2023-07-21 ENCOUNTER — Ambulatory Visit: Payer: Medicare Other | Admitting: Neurology

## 2023-07-21 VITALS — BP 149/81 | HR 79 | Ht 65.0 in | Wt 138.0 lb

## 2023-07-21 DIAGNOSIS — G20A2 Parkinson's disease without dyskinesia, with fluctuations: Secondary | ICD-10-CM | POA: Diagnosis not present

## 2023-07-21 DIAGNOSIS — G2581 Restless legs syndrome: Secondary | ICD-10-CM | POA: Diagnosis not present

## 2023-07-21 DIAGNOSIS — E079 Disorder of thyroid, unspecified: Secondary | ICD-10-CM | POA: Diagnosis not present

## 2023-07-21 NOTE — Patient Instructions (Addendum)
Continue with your levodopa 4 times a day, away from mealtimes.  Be cautious with your driving. Check your thyroid function with your primary care.

## 2023-07-21 NOTE — Progress Notes (Signed)
Subjective:    Patient ID: Melinda Lane is a 72 y.o. female.  HPI    Interim history:   Melinda Lane is a 72 year old right-handed woman with an underlying medical history of hypothyroidism, Crohn's disease, hypertension, hyperlipidemia, irritable bowel syndrome, arthritis, Parkinson's disease and anxiety, who presents for FU consultation of her PD and RLS. The patient is unaccompanied today and presents after a longer gap of over 1 year. I last saw her on 04/21/2022, at which time she reported restless leg symptoms for the past 3+ weeks.  She had been on iron supplementation over-the-counter for years.  She had increased her levodopa on her own to 4 pills a day and she had used her daughter's prescription to alleviate restless leg symptoms including gabapentin at night and hydroxyzine.  We did some blood work at the time.  She had tapered off her Mysoline.  She was advised to continue with levodopa 1 pill 4 times daily but discouraged from using her daughter's prescription or anyone else's prescriptions. Blood work from 04/22/2023 showed a below normal TSH at 0.04, CBC with differential and platelets was benign, iron studies showed benign findings, vitamin B12 552, folate about 20, ferritin level normal at 96.  She was advised to make a follow-up appointment with her primary care physician for thyroid dysfunction  Today, 07/21/2023: She reports feeling fairly stable with regards to her Parkinson's symptoms.  She has not had any recent falls.  She had right total knee replacement 5 weeks ago under Dr. Devonne Doughty.  She is doing fairly well, continues to take some pain medication, half a pill of hydrocodone 3 times a day and Robaxin 3 times a day.  She feels that the medication is not sedating to her and she drives.  She takes low-dose pramipexole per PCP at night.  She continues to take generic Sinemet 4 times a day, she reported initially that she takes it with her 3 meals and at bedtime and then reported that she  actually takes the medication about 30 to 60 minutes before her meals.  First dose of her levodopa is around 9 or 9:30 AM, second dose around 1:30 PM, third dose around 6 PM and last dose around 11:30 PM which is close to her bedtime.  She denies any significant issues with constipation and takes an over-the-counter stool softener.  She is due for her eye exam and has an appointment in January.  She wears contact lenses.  She reports that she hydrates well with water.  She is not sure when she had her last blood test for thyroid function.  She did have a reduction in her thyroid medication, NP thyroid, currently at 60 mg daily, reduced from 90 mg.  The patient's allergies, current medications, family history, past medical history, past social history, past surgical history and problem list were reviewed and updated as appropriate.    Previously:  I first met her on 12/07/2021, at which time I recommended that she taper off Mysoline as she had features of Parkinson's disease.  She was encouraged to continue with Sinemet 3 times a day and advised to proceed with a DaTscan.  She had an interim DaTscan on 01/29/2022 which showed:  IMPRESSION:  Significant decreased striatal Ioflupane activity as above. This pattern can be seen in Parkinsonian syndromes.   Of note, DaTSCAN is not diagnostic of Parkinsonian syndromes, which remains a clinical diagnosis. DaTscan is an adjuvant test to aid in the clinical diagnosis of Parkinsonian syndromes.  She was notified of the test results and advised to continue with Sinemet.       12/07/21 (SA): She has previously seen Dr. Anne Hahn in this office and was last seen by him on 04/14/2021.  I reviewed the note and copied the note below for reference.  She has been on low-dose Sinemet as well as Mysoline for her hand tremor.   She had previously seen Dr. Royston Sinner for her tremor.  I reviewed his office note through [facility] on 01/30/2020.  At that time  she reported that the Sinemet was not helping her tremor.  She was advised to stop Sinemet at the time.     She was also evaluated by Dr. Terrace Arabia office in March 2016 for headaches.  I reviewed her office note from 11/15/2014.   She had a head CT without contrast on 11/12/2014 and I reviewed the results: IMPRESSION: No intracranial mass, hemorrhage, or focal gray -white compartment lesion/acute appearing infarct. Stable pineal calcification. Stability of this calcification over several years is consistent with benign etiology.     04/14/21 (Dr. Anne Hahn): << Melinda Lane is a 72 year old right-handed white female with a history of a right upper extremity tremor.  The patient has been felt to have Parkinson's disease, she was placed on Sinemet taking 25/100 mg tablets, 1 tablet 3 times daily.  The patient has also been treated through Dr. Hyacinth Meeker in the past, he has treated her for essential tremor.  She is now on Mysoline taking 100 mg 3 times daily, she is tolerating the medication well and she does believe that this has helped her tremor.  She has noted some ongoing problems with micrographia and some recent troubles with getting up off the floor if she is playing with her grandchildren.  She has not had any falls.  She denies any leg tremor.  She has not had any problems with speech or swallowing.  She returns to this office for further evaluation.  She does take Benadryl at night which helps her tremors.>>        Her Past Medical History Is Significant For: Past Medical History:  Diagnosis Date   Absence of bladder continence 11/15/2014   Acquired hypothyroidism 04/12/2012   Adjustment disorder with anxiety 02/17/2013   patient states she has no known diagnosis of this, doesn't take medication for this, states her primary care doesn't even have it noted in their records   Arterial fibromuscular dysplasia (HCC) 08/25/2011   Arthralgia of both hands 11/22/2017   Benign renovascular hypertension 06/23/2011    Formatting of this note might be different from the original. right   Cephalalgia 11/15/2014   Chest pain 04/12/2012   Crohn's disease of ileum without complication (HCC) 03/30/2016   Elevated troponin    Esophageal dysphagia 01/13/2017   HTN (hypertension) 04/12/2012   Hyperlipemia 04/12/2012   Hyperlipidemia 04/12/2012   Hypothyroidism    IBS (irritable bowel syndrome)    Incontinence in female 11/12/2014   Left knee pain 10/14/2011   Parkinson's disease (HCC) 07/24/2019   Pes anserine bursitis 10/14/2011   Primary osteoarthritis involving multiple joints 11/25/2017   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(HCC)    RLS (restless legs syndrome) 10/10/2019   Thyroid nodule 04/12/2012   Tremor 10/10/2019    Her Past Surgical History Is Significant For: Past Surgical History:  Procedure Laterality Date   APPENDECTOMY     AUGMENTATION MAMMAPLASTY     saline - 2015   CHOLECYSTECTOMY     ECTOPIC  PREGNANCY SURGERY     KNEE SURGERY     LEFT HEART CATH AND CORONARY ANGIOGRAPHY N/A 03/11/2021   Procedure: LEFT HEART CATH AND CORONARY ANGIOGRAPHY;  Surgeon: Lennette Bihari, MD;  Location: [facility];  Service: Cardiovascular;  Laterality: N/A;   TONSILLECTOMY      Her Family History Is Significant For: Family History  Problem Relation Age of Onset   Hypertension Mother    Heart disease Mother    Restless legs syndrome Mother    Parkinson's disease Mother    Hypertension Father    Heart disease Father     Her Social History Is Significant For: Social History   Socioeconomic History   Marital status: Married    Spouse name: Not on file   Number of children: 2   Years of education: HS   Highest education level: Not on file  Occupational History   Occupation: Accounting  Tobacco Use   Smoking status: Never   Smokeless tobacco: Never  Vaping Use   Vaping status: Never Used  Substance and Sexual Activity   Alcohol use: Yes    Alcohol/week: 4.0 standard drinks of alcohol     Types: 4 Glasses of wine per week    Comment: One weekly - 2 glasses of wine   Drug use: No   Sexual activity: Yes    Birth control/protection: Post-menopausal    Comment: 1st intercourse 72 yo-Fewer than 5 partners  Other Topics Concern   Not on file  Social History Narrative   Lives at home with her husband.   Right-handed.   1 cup/day of tea    Social Determinants of Health   Financial Resource Strain: Low Risk  (10/19/2022)   Received from [facility], [facility]   Overall Financial Resource Strain (CARDIA)    Difficulty of Paying Living Expenses: Not hard at all  Food Insecurity: No Food Insecurity (10/19/2022)   Received from [facility], [facility]   Hunger Vital Sign    Worried About Running Out of Food in the Last Year: Never true    Ran Out of Food in the Last Year: Never true  Transportation Needs: No Transportation Needs (10/19/2022)   Received from [facility], [facility]   PRAPARE - Transportation    Lack of Transportation (Medical): No    Lack of Transportation (Non-Medical): No  Physical Activity: Not on file  Stress: Not on file  Social Connections: Unknown (01/16/2022)   Received from [facility], [facility]   Social Network    Social Network: Not on file    Her Allergies Are:  Allergies  Allergen Reactions   Zithromax [Azithromycin] Rash  :   Her Current Medications Are:  Outpatient Encounter Medications as of 07/21/2023  Medication Sig   amLODipine (NORVASC) 5 MG tablet Take by mouth 2 (two) times daily.   carbidopa-levodopa (SINEMET IR) 25-100 MG tablet TAKE 1 TABLET BY MOUTH FOUR TIMES DAILY   celecoxib (CELEBREX) 200 MG capsule Take 200 mg by mouth daily.   cholecalciferol (VITAMIN D) 1000 UNITS tablet Take 1,000 Units by mouth daily.   ferrous sulfate 325 (65 FE) MG tablet Take 325 mg by mouth daily with breakfast.   HYDROcodone-acetaminophen (NORCO) 10-325 MG tablet Take 1 tablet by mouth every 8 (eight) hours as needed. 1/2  tablet 3x daily   ibuprofen (ADVIL) 800 MG tablet Take 800 mg by mouth 3 (three) times daily.   methocarbamol (ROBAXIN) 500 MG tablet Take 1 tablet every  6-8 hours by oral route as needed for spasm for 10 days.   Multiple Vitamin (MULTIVITAMIN) tablet Take 1 tablet by mouth daily.   NONFORMULARY OR COMPOUNDED ITEM Apply 4-6 tablets topically every 4 (four) months. Estrogen and Testosterone pellets put under the skin   NP THYROID 90 MG tablet Take 60 mg by mouth every morning.   Omega-3 Fatty Acids (FISH OIL) 1000 MG CAPS Take 1,000 mg by mouth daily.   pramipexole (MIRAPEX) 0.25 MG tablet Take by mouth.   progesterone (PROMETRIUM) 200 MG capsule Take 200 mg by mouth daily. Compound Rx   SKYRIZI 360 MG/2.4ML SOCT Inject into the skin.   telmisartan (MICARDIS) 80 MG tablet Take 80 mg by mouth daily.   amLODipine (NORVASC) 5 MG tablet Take 1 tablet (5 mg total) by mouth daily. (Patient taking differently: Take 5 mg by mouth 2 (two) times daily.)   dicyclomine (BENTYL) 20 MG tablet Take 20 mg by mouth 4 (four) times daily.   mesalamine (PENTASA) 500 MG CR capsule Take 1,000 mg by mouth 2 (two) times daily.    spironolactone (ALDACTONE) 25 MG tablet Take 25 mg by mouth daily.   No facility-administered encounter medications on file as of 07/21/2023.  :  Review of Systems:  Out of a complete 14 point review of systems, all are reviewed and negative with the exception of these symptoms as listed below:   Review of Systems  Neurological:        Pt here for Parkinson and RLS f/u Pt states  no change for parkinson since last visit Pt states RLS worse . Pt states very painful at bedtime      Objective:  Neurological Exam  Physical Exam  Physical Examination:   Vitals:   07/21/23 1320  BP: (!) 149/81  Pulse: 79    General Examination: The patient is a very pleasant 72 y.o. female in no acute distress. She appears well-developed and well-nourished and well groomed.   HEENT:  Normocephalic, atraumatic, pupils are equal, round and reactive to light, extraocular tracking well-preserved.  Mild decrease in eye blink rate, mild facial masking. Mild nuchal rigidity noted, stable.  Speech is clear without dysarthria, hypophonia or voice tremor, hearing grossly intact.  Airway examination reveals benign findings, mild mouth dryness noted.  Tongue protrudes centrally and palate elevates symmetrically, no carotid bruits.     Chest: Clear to auscultation without wheezing, rhonchi or crackles noted.   Heart: S1+S2+0, mild systolic murmur noted, not new per patient.     Abdomen: Soft, non-tender and non-distended.   Extremities: There is trace pitting edema in the right ankle area.    Skin: Warm and dry without trophic changes noted.    Musculoskeletal: exam reveals no obvious joint deformities, mild R knee swelling.    Neurologically:  Mental status: The patient is awake, alert and oriented in all 4 spheres. Her immediate and remote memory, attention, language skills and fund of knowledge are appropriate. There is no evidence of aphasia, agnosia, apraxia or anomia. Speech is clear with normal prosody and enunciation. Thought process is linear. Mood is normal and affect is normal.  Cranial nerves II - XII are as described above under HEENT exam.  Motor exam: Normal bulk, and normal strength noted, mild increase in tone in right upper extremity, intermittent very mild resting tremor in the right upper extremity noted, no other resting tremor.  Fine motor skills and coordination: Mild to moderate difficulty with finger taps, hand movements, and foot taps  bilaterally, with lateralization to the right.  She has no obvious postural or action tremor in the upper extremities bilaterally.   Cerebellar testing: No dysmetria or intention tremor. There is no truncal or gait ataxia.  Sensory exam: intact to light touch in the upper and lower extremities.  Gait, station and balance: She stands  without difficulty, posture is mildly stooped for age, she walks with a decrease in arm swing on the right more than left, turns well, balance fairly well-preserved.  Slight limp on the right, no walking aid.   Assessment and Plan:    In summary, Melinda Lane is a 72 year old right-handed woman with an underlying medical history of hypothyroidism, Crohn's disease, hypertension, hyperlipidemia, irritable bowel syndrome, arthritis with status post right total knee replacement, Parkinson's disease and anxiety, who presents for FU consultation of her PD and RLS. She has been on thyroid medication and has had a fluctuation in her TSH.  She is followed by [facility] for her thyroid function and thyroid medicine.  She is encouraged to let us know how her last thyroid function test looked.  She is encouraged to call us or email [place] through MyChart.  She has not signed up for MyChart as yet. She is advised to continue with levodopa 1 pill 4 times a day as she is tolerating it, she has benefited from levodopa therapy and is reminded to take it away from her mealtimes.  She is cautioned regarding the sedated properties of hydrocodone and Robaxin.  Even pramipexole can be sedating.  She is advised to stay well-hydrated and be proactive about constipation issues.  Her pramipexole is through her PCP.  She is advised to follow-up routinely in this clinic in about 6 to 8 months to see one of our nurse practitioners.  She is currently in physical therapy twice a week for her knee.  She is benefiting from therapy. Of note, she had a DaTscan on 01/29/22, which showed significantly decreased striatal Ioflupane activity. I answered all her questions today and she was in agreement.  I spent 30 minutes in total face-to-face time and in reviewing records during pre-charting, more than 50% of which was spent in counseling and coordination of care, reviewing test results, reviewing medications and treatment regimen and/or in  discussing or reviewing the diagnosis of PD, RLS, the prognosis and treatment options. Pertinent laboratory and imaging test results that were available during this visit with the patient were reviewed by me and considered in my medical decision making (see chart for details).

## 2023-07-25 ENCOUNTER — Encounter: Payer: Self-pay | Admitting: Physical Therapy

## 2023-07-25 ENCOUNTER — Ambulatory Visit: Payer: Medicare Other | Admitting: Physical Therapy

## 2023-07-25 DIAGNOSIS — R262 Difficulty in walking, not elsewhere classified: Secondary | ICD-10-CM | POA: Diagnosis not present

## 2023-07-25 DIAGNOSIS — M6281 Muscle weakness (generalized): Secondary | ICD-10-CM

## 2023-07-25 DIAGNOSIS — R600 Localized edema: Secondary | ICD-10-CM

## 2023-07-25 DIAGNOSIS — M25661 Stiffness of right knee, not elsewhere classified: Secondary | ICD-10-CM

## 2023-07-25 DIAGNOSIS — R2681 Unsteadiness on feet: Secondary | ICD-10-CM

## 2023-07-25 NOTE — Therapy (Signed)
OUTPATIENT PHYSICAL THERAPY LOWER EXTREMITY TREATMENT     Patient Name: Melinda Lane MRN: [id number] DOB:1951/07/27, 72 y.o., female Today's Date: 07/25/2023  END OF SESSION:  PT End of Session - 07/25/23 1601     Visit Number 12    Date for PT Re-Evaluation 09/08/23    PT Start Time 1600    PT Stop Time 1645    PT Time Calculation (min) 45 min    Activity Tolerance Patient tolerated treatment well    Behavior During Therapy [facility] for tasks assessed/performed             Past Medical History:  Diagnosis Date   Absence of bladder continence 11/15/2014   Acquired hypothyroidism 04/12/2012   Adjustment disorder with anxiety 02/17/2013   patient states she has no known diagnosis of this, doesn't take medication for this, states her primary care doesn't even have it noted in their records   Arterial fibromuscular dysplasia (HCC) 08/25/2011   Arthralgia of both hands 11/22/2017   Benign renovascular hypertension 06/23/2011   Formatting of this note might be different from the original. right   Cephalalgia 11/15/2014   Chest pain 04/12/2012   Crohn's disease of ileum without complication (HCC) 03/30/2016   Elevated troponin    Esophageal dysphagia 01/13/2017   HTN (hypertension) 04/12/2012   Hyperlipemia 04/12/2012   Hyperlipidemia 04/12/2012   Hypothyroidism    IBS (irritable bowel syndrome)    Incontinence in female 11/12/2014   Left knee pain 10/14/2011   Parkinson's disease (HCC) 07/24/2019   Pes anserine bursitis 10/14/2011   Primary osteoarthritis involving multiple joints 11/25/2017   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(HCC)    RLS (restless legs syndrome) 10/10/2019   Thyroid nodule 04/12/2012   Tremor 10/10/2019   Past Surgical History:  Procedure Laterality Date   APPENDECTOMY     AUGMENTATION MAMMAPLASTY     saline - 2015   CHOLECYSTECTOMY     ECTOPIC PREGNANCY SURGERY     KNEE SURGERY     LEFT HEART CATH AND CORONARY ANGIOGRAPHY N/A 03/11/2021   Procedure: LEFT  HEART CATH AND CORONARY ANGIOGRAPHY;  Surgeon: Lennette Bihari, MD;  Location: [facility];  Service: Cardiovascular;  Laterality: N/A;   TONSILLECTOMY     Patient Active Problem List   Diagnosis Date Noted   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) 05/11/2021   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(HCC) 03/30/2021   IBS (irritable bowel syndrome) 03/12/2021   Elevated troponin    RLS (restless legs syndrome) 10/10/2019   Tremor 10/10/2019   Parkinson's disease (HCC) 07/24/2019   Primary osteoarthritis involving multiple joints 11/25/2017   Arthralgia of both hands 11/22/2017   Esophageal dysphagia 01/13/2017   Crohn's disease of ileum without complication (HCC) 03/30/2016   Absence of bladder continence 11/15/2014   Cephalalgia 11/15/2014   Incontinence in female 11/12/2014   Adjustment disorder with anxiety 02/17/2013   Chest pain 04/12/2012   HTN (hypertension) 04/12/2012   Hypothyroidism 04/12/2012   Hyperlipemia 04/12/2012   Thyroid nodule 04/12/2012   Hyperlipidemia 04/12/2012   Acquired hypothyroidism 04/12/2012   Left knee pain 10/14/2011   Pes anserine bursitis 10/14/2011   Arterial fibromuscular dysplasia (HCC) 08/25/2011   Benign renovascular hypertension 06/23/2011    PCP: Malka So., MD   REFERRING PROVIDER: Beverely Low, MD   REFERRING DIAG: [phone] (ICD-10-CM) - Other specified arthritis, right knee   THERAPY DIAG:  Difficulty in walking, not elsewhere classified  Localized edema  Muscle weakness (generalized)  Unsteadiness on  feet  Stiffness of right knee, not elsewhere classified  Rationale for Evaluation and Treatment: Rehabilitation  ONSET DATE: 06/13/23  SUBJECTIVE:   SUBJECTIVE STATEMENT: "Im good"  PERTINENT HISTORY: PMHx: tremors, OA, HTN, R TKR 06/13/23 PAIN:  Are you having pain? Yes: NPRS scale: 2/10 Pain location: knee Pain description: Sharp, aching Aggravating factors: Being still and then trying to  move Relieving factors: ice, pain meds.  PRECAUTIONS: Knee  RED FLAGS: None   WEIGHT BEARING RESTRICTIONS: Yes WBAT  FALLS:  Has patient fallen in last 6 months? No  LIVING ENVIRONMENT: Lives with: lives with their family Lives in: House/apartment Stairs: Yes: Internal: 14 steps; on right going up and External: 1 steps; none Has following equipment at home: Dan Humphreys - 2 wheeled  OCCUPATION: Works in an office. Mostly seated.  PLOF: Independent  PATIENT GOALS: Patient would like to recover her ROM and strength. Get back to golf, swimming, diving.  NEXT MD VISIT: Next Thursday, 10/10  OBJECTIVE:  Note: Objective measures were completed at Evaluation unless otherwise noted.  DIAGNOSTIC FINDINGS: N/A  COGNITION: Overall cognitive status: Within functional limits for tasks assessed     SENSATION: WFL  EDEMA:  Normal edema noted.  POSTURE: slightly flexed over walker, decreased WB through RLE  PALPATION: R LE taught throughout foot to thigh, tender points where skin is drawn tight, improved with gentle retrograde massage.  INCISION: clean, dry, no redness noted. Waterproof dressing applied and ace wraps re-applied per patient request vs the TED hose.  LOWER EXTREMITY ROM: Grossly WNL except R knee  Active ROM Right eval Left eval R knee 06/29/23 R Knee 07/14/23 R knee 07/18/23  Hip flexion       Hip extension       Hip abduction       Hip adduction       Hip internal rotation       Hip external rotation       Knee flexion   10 8 8   Knee extension 21 88 101 120 120  Ankle dorsiflexion       Ankle plantarflexion       Ankle inversion       Ankle eversion        (Blank rows = not tested)  LOWER EXTREMITY MMT: L 5/5  MMT Right eval Right 07/14/23 Right 07/18/23  Hip flexion 3+    Hip extension 3+    Hip abduction 3+    Hip adduction     Hip internal rotation     Hip external rotation     Knee flexion 3 4+ 4+  Knee extension 3 4 4   Ankle dorsiflexion  3+    Ankle plantarflexion     Ankle inversion     Ankle eversion      GAIT: Distance walked: In clinic distances Assistive device utilized: Walker - 2 wheeled Level of assistance: Modified independence Comments: Patient walks with step through gait pattern decreased R stance and decreased WB trough RLE   TODAY'S TREATMENT:  DATE:  07/25/23 NuStep L 5 x 5 min  Bike L 2.5 x4 mins R knee PROM wih end range holds  Patella mo  Tibiofemoral mobs S2S holding yellow ball 2x10 HS curls 25lb 2x10, RLE 15lb x10  Rle Leg Ext 5lb x10 Vas R knee med pressure x 10 min  07/20/23 Gait around back building widest part of lot brisk pace RLE lateral eccentric step downs 2x10 R knee PROM wih end range holds  Patella mo  Tibiofemoral mobs Quad sets RLE x10 LAQ RLE 5lb 2x10 Leg press 30lb x20 Vas R knee low pressure x 10 min   07/18/23 Bike L 3.5 x6 min R knee PROM wih end range holds  Patella mobs  Tibiofemoral mobs Checked Goals  S2S holding yellow ball 2x10 HS curls 20lb 2x10, RLE 15lb x10 RLE 5lb x5, 10lb RLE eccentrics  6inch step ups  Leg press 40lb 2x15 Vas R knee low pressure x 10 min  07/14/23 NuStep L 4x 4 min LE only Bike L1 x 3 min R knee PROM wih end range holds  Patella mobs  Tibiofemoral mobs RLE Step ups 6in 2x10 HS curls 20lb 2x10, RLE 15lb x10 RLE 5lb x5 Vas R knee low pressure x 10 min  07/12/23 NuStep L 5x 5 min LE only Bike  L1  x3 min  R knee PROM wih end range holds  Patella mobs  Tibiofemoral mobs S2S holding yellow ball 2x10 Leg press 40lb 2x10, RLE TKE 20lb 2x5 LAQ RLE 3lb 2x10 Vas R knee low pressure x 10 min  07/06/23 NuStep L 5x 5 min R knee PROM wih end range holds  Patella mobs  Tibiofemoral mobs 6in step ups x10 each Leg press 40lb 2x10, RLE TKE 20lb 2x5 HS curls 20lb 2x10 Leg Ext 5lb 2x10 Vas R knee low  pressure x 10 min  07/04/23 NuStep L 4 x 4 min Bike partial & full Revolutions L1  x3 min  Resisted gait 20lb 4 way x3 each Heel raises black bar 2x15 4in step ups x10 R knee PROM wih end range holds  Patella mobs  Tibiofemoral mobs Vas R knee low pressure x 10 min  06/29/23 NuStep L 4 x 5 min Bike partial & full Revolutions L1  x5 min  Resisted gait 20lb 4 way x3 each Leg press 20lb 2x10 R knee PROM wih end range holds  Patella mobs  Tibiofemoral mobs Vas R knee low pressure x 10 min  06/27/23 Bike partial & full Revolutions L1  x5 min  Gait outside around widest part of back parking lot S2S 2x10 R knee PROM wih end range holds  Patella mobs  Tibiofemoral mobs  HS curls red 2x10 Vas R knee low pressure x 10 min   PATIENT EDUCATION:  Education details: POC, review HEP Person educated: Patient Education method: Explanation Education comprehension: verbalized understanding  HOME EXERCISE PROGRAM:  [id number]  ASSESSMENT:  CLINICAL IMPRESSION: Patient is a 72 y.o. who was seen today for physical therapy  treatment for PT S/P R TKR 06/13/23. She Continues to progress with R knee strength and ROM. Eccentric load weakness with toe off RLE noticed with gait. Cues to hold quad contraction with leg extensions. Pt still lacks full R knee Ext. She will benefit from PT to facilitate progression through the TKR rehab process to be able to return to her PLOF.   OBJECTIVE IMPAIRMENTS: Abnormal gait, decreased activity tolerance, decreased balance, decreased coordination, decreased endurance, difficulty walking, decreased ROM, decreased strength,  increased muscle spasms, impaired flexibility, improper body mechanics, postural dysfunction, and pain.   ACTIVITY LIMITATIONS: lifting, bending, sitting, standing, squatting, stairs, transfers, bed mobility, and locomotion level  PARTICIPATION LIMITATIONS: meal prep, cleaning, laundry, driving, shopping, and community activity  PERSONAL  FACTORS: Past/current experiences are also affecting patient's functional outcome.   REHAB POTENTIAL: Good  CLINICAL DECISION MAKING: Evolving/moderate complexity  EVALUATION COMPLEXITY: Moderate   GOALS: Goals reviewed with patient? Yes  SHORT TERM GOALS: Target date: 07/01/23 I with initial HEP Baseline: Goal status: Met 06/29/23  LONG TERM GOALS: Target date: 09/08/23  I with final HEP Baseline:  Goal status: INITIAL  2.  Increase R knee AROM to 0-120 Baseline:  Goal status: Partly Met 07/18/23  3.  Increase R knee strength to at least 4+/5 Baseline:  Goal status: Progressing 411/4/24  4.  Patient will be able to walk x at least 400' MI, with LRAD, on level and unlevel surfaces. Baseline:  Goal status: Progressing 06/29/23  5.  Patient will ambulate up and down at least 12 steps using U rail and step over step, MI Baseline:  Goal status: Progressing 07/06/23  6.  Patient will report initiation of return to golf with putting and practice swings at home, without pain or swelling. Baseline:  Goal status: Ongoing have not played 07/18/23   PLAN:  PT FREQUENCY: 2-3x/week  PT DURATION: 12 weeks  PLANNED INTERVENTIONS: Therapeutic exercises, Therapeutic activity, Neuromuscular re-education, Balance training, Gait training, Patient/Family education, Self Care, Joint mobilization, Stair training, Electrical stimulation, Cryotherapy, Moist heat, scar mobilization, Taping, Vasopneumatic device, Ionotophoresis 4mg /ml Dexamethasone, and Manual therapy  PLAN FOR NEXT SESSION: Progress strength and ROM activities  Oley Balm DPT 07/25/23 4:02 PM   Debroah Baller, PTA 07/25/2023, 4:02 PM

## 2023-07-28 ENCOUNTER — Encounter: Payer: Self-pay | Admitting: Physical Therapy

## 2023-07-28 ENCOUNTER — Ambulatory Visit: Payer: Medicare Other | Admitting: Physical Therapy

## 2023-07-28 DIAGNOSIS — R600 Localized edema: Secondary | ICD-10-CM

## 2023-07-28 DIAGNOSIS — R262 Difficulty in walking, not elsewhere classified: Secondary | ICD-10-CM | POA: Diagnosis not present

## 2023-07-28 DIAGNOSIS — M6281 Muscle weakness (generalized): Secondary | ICD-10-CM

## 2023-07-28 DIAGNOSIS — M25661 Stiffness of right knee, not elsewhere classified: Secondary | ICD-10-CM

## 2023-07-28 DIAGNOSIS — R2681 Unsteadiness on feet: Secondary | ICD-10-CM

## 2023-07-28 NOTE — Therapy (Signed)
OUTPATIENT PHYSICAL THERAPY LOWER EXTREMITY TREATMENT     Patient Name: Melinda Lane MRN: [id number] DOB:11-Aug-1951, 72 y.o., female Today's Date: 07/28/2023  END OF SESSION:  PT End of Session - 07/28/23 1523     Visit Number 13    Date for PT Re-Evaluation 09/08/23    PT Start Time 1523    PT Stop Time 1610    PT Time Calculation (min) 47 min    Activity Tolerance Patient tolerated treatment well    Behavior During Therapy [facility] for tasks assessed/performed             Past Medical History:  Diagnosis Date   Absence of bladder continence 11/15/2014   Acquired hypothyroidism 04/12/2012   Adjustment disorder with anxiety 02/17/2013   patient states she has no known diagnosis of this, doesn't take medication for this, states her primary care doesn't even have it noted in their records   Arterial fibromuscular dysplasia (HCC) 08/25/2011   Arthralgia of both hands 11/22/2017   Benign renovascular hypertension 06/23/2011   Formatting of this note might be different from the original. right   Cephalalgia 11/15/2014   Chest pain 04/12/2012   Crohn's disease of ileum without complication (HCC) 03/30/2016   Elevated troponin    Esophageal dysphagia 01/13/2017   HTN (hypertension) 04/12/2012   Hyperlipemia 04/12/2012   Hyperlipidemia 04/12/2012   Hypothyroidism    IBS (irritable bowel syndrome)    Incontinence in female 11/12/2014   Left knee pain 10/14/2011   Parkinson's disease (HCC) 07/24/2019   Pes anserine bursitis 10/14/2011   Primary osteoarthritis involving multiple joints 11/25/2017   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(HCC)    RLS (restless legs syndrome) 10/10/2019   Thyroid nodule 04/12/2012   Tremor 10/10/2019   Past Surgical History:  Procedure Laterality Date   APPENDECTOMY     AUGMENTATION MAMMAPLASTY     saline - 2015   CHOLECYSTECTOMY     ECTOPIC PREGNANCY SURGERY     KNEE SURGERY     LEFT HEART CATH AND CORONARY ANGIOGRAPHY N/A 03/11/2021   Procedure: LEFT  HEART CATH AND CORONARY ANGIOGRAPHY;  Surgeon: Lennette Bihari, MD;  Location: [facility];  Service: Cardiovascular;  Laterality: N/A;   TONSILLECTOMY     Patient Active Problem List   Diagnosis Date Noted   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) 05/11/2021   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(HCC) 03/30/2021   IBS (irritable bowel syndrome) 03/12/2021   Elevated troponin    RLS (restless legs syndrome) 10/10/2019   Tremor 10/10/2019   Parkinson's disease (HCC) 07/24/2019   Primary osteoarthritis involving multiple joints 11/25/2017   Arthralgia of both hands 11/22/2017   Esophageal dysphagia 01/13/2017   Crohn's disease of ileum without complication (HCC) 03/30/2016   Absence of bladder continence 11/15/2014   Cephalalgia 11/15/2014   Incontinence in female 11/12/2014   Adjustment disorder with anxiety 02/17/2013   Chest pain 04/12/2012   HTN (hypertension) 04/12/2012   Hypothyroidism 04/12/2012   Hyperlipemia 04/12/2012   Thyroid nodule 04/12/2012   Hyperlipidemia 04/12/2012   Acquired hypothyroidism 04/12/2012   Left knee pain 10/14/2011   Pes anserine bursitis 10/14/2011   Arterial fibromuscular dysplasia (HCC) 08/25/2011   Benign renovascular hypertension 06/23/2011    PCP: Malka So., MD   REFERRING PROVIDER: Beverely Low, MD   REFERRING DIAG: [phone] (ICD-10-CM) - Other specified arthritis, right knee   THERAPY DIAG:  Difficulty in walking, not elsewhere classified  Muscle weakness (generalized)  Unsteadiness on feet  Stiffness  of right knee, not elsewhere classified  Localized edema  Rationale for Evaluation and Treatment: Rehabilitation  ONSET DATE: 06/13/23  SUBJECTIVE:   SUBJECTIVE STATEMENT: Having a lot pf pain, not sure, cause of the rain  PERTINENT HISTORY: PMHx: tremors, OA, HTN, R TKR 06/13/23 PAIN:  Are you having pain? Yes: NPRS scale: 4/10 Pain location: knee Pain description: Sharp, aching Aggravating factors:  Being still and then trying to move Relieving factors: ice, pain meds.  PRECAUTIONS: Knee  RED FLAGS: None   WEIGHT BEARING RESTRICTIONS: Yes WBAT  FALLS:  Has patient fallen in last 6 months? No  LIVING ENVIRONMENT: Lives with: lives with their family Lives in: House/apartment Stairs: Yes: Internal: 14 steps; on right going up and External: 1 steps; none Has following equipment at home: Dan Humphreys - 2 wheeled  OCCUPATION: Works in an office. Mostly seated.  PLOF: Independent  PATIENT GOALS: Patient would like to recover her ROM and strength. Get back to golf, swimming, diving.  NEXT MD VISIT: Next Thursday, 10/10  OBJECTIVE:  Note: Objective measures were completed at Evaluation unless otherwise noted.  DIAGNOSTIC FINDINGS: N/A  COGNITION: Overall cognitive status: Within functional limits for tasks assessed     SENSATION: WFL  EDEMA:  Normal edema noted.  POSTURE: slightly flexed over walker, decreased WB through RLE  PALPATION: R LE taught throughout foot to thigh, tender points where skin is drawn tight, improved with gentle retrograde massage.  INCISION: clean, dry, no redness noted. Waterproof dressing applied and ace wraps re-applied per patient request vs the TED hose.  LOWER EXTREMITY ROM: Grossly WNL except R knee  Active ROM Right eval Left eval R knee 06/29/23 R Knee 07/14/23 R knee 07/18/23  Hip flexion       Hip extension       Hip abduction       Hip adduction       Hip internal rotation       Hip external rotation       Knee flexion   10 8 8   Knee extension 21 88 101 120 120  Ankle dorsiflexion       Ankle plantarflexion       Ankle inversion       Ankle eversion        (Blank rows = not tested)  LOWER EXTREMITY MMT: L 5/5  MMT Right eval Right 07/14/23 Right 07/18/23  Hip flexion 3+    Hip extension 3+    Hip abduction 3+    Hip adduction     Hip internal rotation     Hip external rotation     Knee flexion 3 4+ 4+  Knee  extension 3 4 4   Ankle dorsiflexion 3+    Ankle plantarflexion     Ankle inversion     Ankle eversion      GAIT: Distance walked: In clinic distances Assistive device utilized: Walker - 2 wheeled Level of assistance: Modified independence Comments: Patient walks with step through gait pattern decreased R stance and decreased WB trough RLE   TODAY'S TREATMENT:  DATE:  07/28/23 Bike L2 x6 min Step ups airex on 6in box x10 each Lateral 8in step ups from airex x 10 each  Heel raises 2x10 R knee PROM wih end range holds  Patella mobs  Tibiofemoral mobs Vas R knee med pressure x 10 min  07/25/23 NuStep L 5 x 5 min  Bike L 2.5 x4 mins R knee PROM wih end range holds  Patella mo  Tibiofemoral mobs S2S holding yellow ball 2x10 HS curls 25lb 2x10, RLE 15lb x10  Rle Leg Ext 5lb x10 Vas R knee med pressure x 10 min  07/20/23 Gait around back building widest part of lot brisk pace RLE lateral eccentric step downs 2x10 R knee PROM wih end range holds  Patella mo  Tibiofemoral mobs Quad sets RLE x10 LAQ RLE 5lb 2x10 Leg press 30lb x20 Vas R knee low pressure x 10 min   07/18/23 Bike L 3.5 x6 min R knee PROM wih end range holds  Patella mobs  Tibiofemoral mobs Checked Goals  S2S holding yellow ball 2x10 HS curls 20lb 2x10, RLE 15lb x10 RLE 5lb x5, 10lb RLE eccentrics  6inch step ups  Leg press 40lb 2x15 Vas R knee low pressure x 10 min  07/14/23 NuStep L 4x 4 min LE only Bike L1 x 3 min R knee PROM wih end range holds  Patella mobs  Tibiofemoral mobs RLE Step ups 6in 2x10 HS curls 20lb 2x10, RLE 15lb x10 RLE 5lb x5 Vas R knee low pressure x 10 min  07/12/23 NuStep L 5x 5 min LE only Bike  L1  x3 min  R knee PROM wih end range holds  Patella mobs  Tibiofemoral mobs S2S holding yellow ball 2x10 Leg press 40lb 2x10, RLE TKE 20lb 2x5 LAQ  RLE 3lb 2x10 Vas R knee low pressure x 10 min  PATIENT EDUCATION:  Education details: POC, review HEP Person educated: Patient Education method: Explanation Education comprehension: verbalized understanding  HOME EXERCISE PROGRAM:  [id number]  ASSESSMENT:  CLINICAL IMPRESSION: Patient is a 72 y.o. who was seen today for physical therapy  treatment for PT S/P R TKR 06/13/23. She enters ~ 8 minted late for today's session. She Continues to progress with R knee strength and ROM. Instability present today with step ups with the use of airex pad, min to mod assist needed at times to maintain stability. Pt still lacks full R knee Ext. She will benefit from PT to facilitate progression through the TKR rehab process to be able to return to her PLOF.   OBJECTIVE IMPAIRMENTS: Abnormal gait, decreased activity tolerance, decreased balance, decreased coordination, decreased endurance, difficulty walking, decreased ROM, decreased strength, increased muscle spasms, impaired flexibility, improper body mechanics, postural dysfunction, and pain.   ACTIVITY LIMITATIONS: lifting, bending, sitting, standing, squatting, stairs, transfers, bed mobility, and locomotion level  PARTICIPATION LIMITATIONS: meal prep, cleaning, laundry, driving, shopping, and community activity  PERSONAL FACTORS: Past/current experiences are also affecting patient's functional outcome.   REHAB POTENTIAL: Good  CLINICAL DECISION MAKING: Evolving/moderate complexity  EVALUATION COMPLEXITY: Moderate   GOALS: Goals reviewed with patient? Yes  SHORT TERM GOALS: Target date: 07/01/23 I with initial HEP Baseline: Goal status: Met 06/29/23  LONG TERM GOALS: Target date: 09/08/23  I with final HEP Baseline:  Goal status: INITIAL  2.  Increase R knee AROM to 0-120 Baseline:  Goal status: Partly Met 07/18/23  3.  Increase R knee strength to at least 4+/5 Baseline:  Goal status: Progressing 07/18/23  4.  Patient will be  able to walk x at least 400' MI, with LRAD, on level and unlevel surfaces. Baseline:  Goal status: Met  07/28/23  5.  Patient will ambulate up and down at least 12 steps using U rail and step over step, MI Baseline:  Goal status: Progressing 07/06/23  6.  Patient will report initiation of return to golf with putting and practice swings at home, without pain or swelling. Baseline:  Goal status: Ongoing have not played 07/18/23   PLAN:  PT FREQUENCY: 2-3x/week  PT DURATION: 12 weeks  PLANNED INTERVENTIONS: Therapeutic exercises, Therapeutic activity, Neuromuscular re-education, Balance training, Gait training, Patient/Family education, Self Care, Joint mobilization, Stair training, Electrical stimulation, Cryotherapy, Moist heat, scar mobilization, Taping, Vasopneumatic device, Ionotophoresis 4mg /ml Dexamethasone, and Manual therapy  PLAN FOR NEXT SESSION: Progress strength and ROM activities  Oley Balm DPT 07/28/23 3:59 PM   Debroah Baller, PTA 07/28/2023, 3:59 PM

## 2023-08-01 ENCOUNTER — Ambulatory Visit: Payer: Medicare Other | Admitting: Physical Therapy

## 2023-08-01 ENCOUNTER — Encounter: Payer: Self-pay | Admitting: Physical Therapy

## 2023-08-01 DIAGNOSIS — M25661 Stiffness of right knee, not elsewhere classified: Secondary | ICD-10-CM

## 2023-08-01 DIAGNOSIS — R262 Difficulty in walking, not elsewhere classified: Secondary | ICD-10-CM | POA: Diagnosis not present

## 2023-08-01 DIAGNOSIS — R600 Localized edema: Secondary | ICD-10-CM

## 2023-08-01 DIAGNOSIS — R2681 Unsteadiness on feet: Secondary | ICD-10-CM

## 2023-08-01 DIAGNOSIS — M6281 Muscle weakness (generalized): Secondary | ICD-10-CM

## 2023-08-01 NOTE — Therapy (Signed)
OUTPATIENT PHYSICAL THERAPY LOWER EXTREMITY TREATMENT     Patient Name: Melinda Lane MRN: [id number] DOB:[birth year], 72 y.o., female Today's Date: 08/01/2023  END OF SESSION:  PT End of Session - 08/01/23 1600     Visit Number 14    Date for PT Re-Evaluation 09/08/23    PT Start Time 1600    PT Stop Time 1645    PT Time Calculation (min) 45 min    Activity Tolerance Patient tolerated treatment well    Behavior During Therapy [facility] for tasks assessed/performed             Past Medical History:  Diagnosis Date   Absence of bladder continence 11/15/2014   Acquired hypothyroidism 04/12/2012   Adjustment disorder with anxiety 02/17/2013   patient states she has no known diagnosis of this, doesn't take medication for this, states her primary care doesn't even have it noted in their records   Arterial fibromuscular dysplasia (HCC) 08/25/2011   Arthralgia of both hands 11/22/2017   Benign renovascular hypertension 06/23/2011   Formatting of this note might be different from the original. right   Cephalalgia 11/15/2014   Chest pain 04/12/2012   Crohn's disease of ileum without complication (HCC) 03/30/2016   Elevated troponin    Esophageal dysphagia 01/13/2017   HTN (hypertension) 04/12/2012   Hyperlipemia 04/12/2012   Hyperlipidemia 04/12/2012   Hypothyroidism    IBS (irritable bowel syndrome)    Incontinence in female 11/12/2014   Left knee pain 10/14/2011   Parkinson's disease (HCC) 07/24/2019   Pes anserine bursitis 10/14/2011   Primary osteoarthritis involving multiple joints 11/25/2017   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(HCC)    RLS (restless legs syndrome) 10/10/2019   Thyroid nodule 04/12/2012   Tremor 10/10/2019   Past Surgical History:  Procedure Laterality Date   APPENDECTOMY     AUGMENTATION MAMMAPLASTY     saline - 2015   CHOLECYSTECTOMY     ECTOPIC PREGNANCY SURGERY     KNEE SURGERY     LEFT HEART CATH AND CORONARY ANGIOGRAPHY N/A 03/11/2021   Procedure: LEFT  HEART CATH AND CORONARY ANGIOGRAPHY;  Surgeon: Lennette Bihari, MD;  Location: [facility];  Service: Cardiovascular;  Laterality: N/A;   TONSILLECTOMY     Patient Active Problem List   Diagnosis Date Noted   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) 05/11/2021   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(HCC) 03/30/2021   IBS (irritable bowel syndrome) 03/12/2021   Elevated troponin    RLS (restless legs syndrome) 10/10/2019   Tremor 10/10/2019   Parkinson's disease (HCC) 07/24/2019   Primary osteoarthritis involving multiple joints 11/25/2017   Arthralgia of both hands 11/22/2017   Esophageal dysphagia 01/13/2017   Crohn's disease of ileum without complication (HCC) 03/30/2016   Absence of bladder continence 11/15/2014   Cephalalgia 11/15/2014   Incontinence in female 11/12/2014   Adjustment disorder with anxiety 02/17/2013   Chest pain 04/12/2012   HTN (hypertension) 04/12/2012   Hypothyroidism 04/12/2012   Hyperlipemia 04/12/2012   Thyroid nodule 04/12/2012   Hyperlipidemia 04/12/2012   Acquired hypothyroidism 04/12/2012   Left knee pain 10/14/2011   Pes anserine bursitis 10/14/2011   Arterial fibromuscular dysplasia (HCC) 08/25/2011   Benign renovascular hypertension 06/23/2011    PCP: Malka So., MD   REFERRING PROVIDER: Beverely Low, MD   REFERRING DIAG: [phone] (ICD-10-CM) - Other specified arthritis, right knee   THERAPY DIAG:  Difficulty in walking, not elsewhere classified  Muscle weakness (generalized)  Stiffness of right knee, not  elsewhere classified  Localized edema  Unsteadiness on feet  Rationale for Evaluation and Treatment: Rehabilitation  ONSET DATE: 06/13/23  SUBJECTIVE:   SUBJECTIVE STATEMENT: "Im good"  PERTINENT HISTORY: PMHx: tremors, OA, HTN, R TKR 06/13/23 PAIN:  Are you having pain? Yes: NPRS scale: 2/10 Pain location: knee Pain description: Sharp, aching Aggravating factors: Being still and then trying to  move Relieving factors: ice, pain meds.  PRECAUTIONS: Knee  RED FLAGS: None   WEIGHT BEARING RESTRICTIONS: Yes WBAT  FALLS:  Has patient fallen in last 6 months? No  LIVING ENVIRONMENT: Lives with: lives with their family Lives in: House/apartment Stairs: Yes: Internal: 14 steps; on right going up and External: 1 steps; none Has following equipment at home: Dan Humphreys - 2 wheeled  OCCUPATION: Works in an office. Mostly seated.  PLOF: Independent  PATIENT GOALS: Patient would like to recover her ROM and strength. Get back to golf, swimming, diving.  NEXT MD VISIT: Next Thursday, 10/10  OBJECTIVE:  Note: Objective measures were completed at Evaluation unless otherwise noted.  DIAGNOSTIC FINDINGS: N/A  COGNITION: Overall cognitive status: Within functional limits for tasks assessed     SENSATION: WFL  EDEMA:  Normal edema noted.  POSTURE: slightly flexed over walker, decreased WB through RLE  PALPATION: R LE taught throughout foot to thigh, tender points where skin is drawn tight, improved with gentle retrograde massage.  INCISION: clean, dry, no redness noted. Waterproof dressing applied and ace wraps re-applied per patient request vs the TED hose.  LOWER EXTREMITY ROM: Grossly WNL except R knee  Active ROM Right eval Left eval R knee 06/29/23 R Knee 07/14/23 R knee 07/18/23  Hip flexion       Hip extension       Hip abduction       Hip adduction       Hip internal rotation       Hip external rotation       Knee flexion   10 8 8   Knee extension 21 88 101 120 120  Ankle dorsiflexion       Ankle plantarflexion       Ankle inversion       Ankle eversion        (Blank rows = not tested)  LOWER EXTREMITY MMT: L 5/5  MMT Right eval Right 07/14/23 Right 07/18/23  Hip flexion 3+    Hip extension 3+    Hip abduction 3+    Hip adduction     Hip internal rotation     Hip external rotation     Knee flexion 3 4+ 4+  Knee extension 3 4 4   Ankle dorsiflexion  3+    Ankle plantarflexion     Ankle inversion     Ankle eversion      GAIT: Distance walked: In clinic distances Assistive device utilized: Walker - 2 wheeled Level of assistance: Modified independence Comments: Patient walks with step through gait pattern decreased R stance and decreased WB trough RLE   TODAY'S TREATMENT:  DATE:  08/01/23 NuStep L5 x4 min Bike L3 x 4 min Lateral 8in step ups from airex x 10 each  Step ups airex on 6in box x10 each Heel raises 2x15 Leg press 50lb 2x12 R knee PROM wih end range holds  Patella mobs  Tibiofemoral mobs Vas R knee med pressure x 10 min  07/28/23 Bike L2 x6 min Step ups airex on 6in box x10 each Lateral 8in step ups from airex x 10 each  Heel raises 2x10 R knee PROM wih end range holds  Patella mobs  Tibiofemoral mobs Vas R knee med pressure x 10 min  07/25/23 NuStep L 5 x 5 min  Bike L 2.5 x4 mins R knee PROM wih end range holds  Patella mo  Tibiofemoral mobs S2S holding yellow ball 2x10 HS curls 25lb 2x10, RLE 15lb x10  Rle Leg Ext 5lb x10 Vas R knee med pressure x 10 min  07/20/23 Gait around back building widest part of lot brisk pace RLE lateral eccentric step downs 2x10 R knee PROM wih end range holds  Patella mo  Tibiofemoral mobs Quad sets RLE x10 LAQ RLE 5lb 2x10 Leg press 30lb x20 Vas R knee low pressure x 10 min   07/18/23 Bike L 3.5 x6 min R knee PROM wih end range holds  Patella mobs  Tibiofemoral mobs Checked Goals  S2S holding yellow ball 2x10 HS curls 20lb 2x10, RLE 15lb x10 RLE 5lb x5, 10lb RLE eccentrics  6inch step ups  Leg press 40lb 2x15 Vas R knee low pressure x 10 min   PATIENT EDUCATION:  Education details: POC, review HEP Person educated: Patient Education method: Explanation Education comprehension: verbalized understanding  HOME EXERCISE  PROGRAM:  [id number]  ASSESSMENT:  CLINICAL IMPRESSION: Patient is a 72 y.o. who was seen today for physical therapy  treatment for PT S/P R TKR 06/13/23. She Continues to progress with R knee strength and ROM. Instability remains with step ups with the use of airex pad, min to mod assist needed at times to maintain stability. Pt still lacks full R knee Ext. Compensatory strategies noted with step ups when stepping up with RLE.  She will benefit from PT to facilitate progression through the TKR rehab process to be able to return to her PLOF.   OBJECTIVE IMPAIRMENTS: Abnormal gait, decreased activity tolerance, decreased balance, decreased coordination, decreased endurance, difficulty walking, decreased ROM, decreased strength, increased muscle spasms, impaired flexibility, improper body mechanics, postural dysfunction, and pain.   ACTIVITY LIMITATIONS: lifting, bending, sitting, standing, squatting, stairs, transfers, bed mobility, and locomotion level  PARTICIPATION LIMITATIONS: meal prep, cleaning, laundry, driving, shopping, and community activity  PERSONAL FACTORS: Past/current experiences are also affecting patient's functional outcome.   REHAB POTENTIAL: Good  CLINICAL DECISION MAKING: Evolving/moderate complexity  EVALUATION COMPLEXITY: Moderate   GOALS: Goals reviewed with patient? Yes  SHORT TERM GOALS: Target date: 07/01/23 I with initial HEP Baseline: Goal status: Met 06/29/23  LONG TERM GOALS: Target date: 09/08/23  I with final HEP Baseline:  Goal status: INITIAL  2.  Increase R knee AROM to 0-120 Baseline:  Goal status: Partly Met 07/18/23  3.  Increase R knee strength to at least 4+/5 Baseline:  Goal status: Progressing 07/18/23  4.  Patient will be able to walk x at least 400' MI, with LRAD, on level and unlevel surfaces. Baseline:  Goal status: Met  07/28/23  5.  Patient will ambulate up and down at least 12 steps using U rail and step over  step,  MI Baseline:  Goal status: Progressing 07/06/23  6.  Patient will report initiation of return to golf with putting and practice swings at home, without pain or swelling. Baseline:  Goal status: Ongoing have not played 07/18/23   PLAN:  PT FREQUENCY: 2-3x/week  PT DURATION: 12 weeks  PLANNED INTERVENTIONS: Therapeutic exercises, Therapeutic activity, Neuromuscular re-education, Balance training, Gait training, Patient/Family education, Self Care, Joint mobilization, Stair training, Electrical stimulation, Cryotherapy, Moist heat, scar mobilization, Taping, Vasopneumatic device, Ionotophoresis 4mg /ml Dexamethasone, and Manual therapy  PLAN FOR NEXT SESSION: Progress strength and ROM activities  Oley Balm DPT 08/01/23 4:00 PM   Debroah Baller, PTA 08/01/2023, 4:00 PM

## 2023-08-03 ENCOUNTER — Ambulatory Visit: Payer: Medicare Other | Admitting: Physical Therapy

## 2023-08-03 ENCOUNTER — Encounter: Payer: Self-pay | Admitting: Physical Therapy

## 2023-08-03 DIAGNOSIS — R2681 Unsteadiness on feet: Secondary | ICD-10-CM

## 2023-08-03 DIAGNOSIS — R600 Localized edema: Secondary | ICD-10-CM

## 2023-08-03 DIAGNOSIS — M6281 Muscle weakness (generalized): Secondary | ICD-10-CM

## 2023-08-03 DIAGNOSIS — R262 Difficulty in walking, not elsewhere classified: Secondary | ICD-10-CM | POA: Diagnosis not present

## 2023-08-03 DIAGNOSIS — M25661 Stiffness of right knee, not elsewhere classified: Secondary | ICD-10-CM

## 2023-08-03 NOTE — Therapy (Signed)
OUTPATIENT PHYSICAL THERAPY LOWER EXTREMITY TREATMENT     Patient Name: Melinda Lane MRN: [id number] DOB:03-17-1951, 72 y.o., female Today's Date: 08/03/2023  END OF SESSION:  PT End of Session - 08/03/23 1346     Visit Number 15    Date for PT Re-Evaluation 09/08/23    PT Start Time 1345    PT Stop Time 1430    PT Time Calculation (min) 45 min    Activity Tolerance Patient tolerated treatment well    Behavior During Therapy [facility] for tasks assessed/performed             Past Medical History:  Diagnosis Date   Absence of bladder continence 11/15/2014   Acquired hypothyroidism 04/12/2012   Adjustment disorder with anxiety 02/17/2013   patient states she has no known diagnosis of this, doesn't take medication for this, states her primary care doesn't even have it noted in their records   Arterial fibromuscular dysplasia (HCC) 08/25/2011   Arthralgia of both hands 11/22/2017   Benign renovascular hypertension 06/23/2011   Formatting of this note might be different from the original. right   Cephalalgia 11/15/2014   Chest pain 04/12/2012   Crohn's disease of ileum without complication (HCC) 03/30/2016   Elevated troponin    Esophageal dysphagia 01/13/2017   HTN (hypertension) 04/12/2012   Hyperlipemia 04/12/2012   Hyperlipidemia 04/12/2012   Hypothyroidism    IBS (irritable bowel syndrome)    Incontinence in female 11/12/2014   Left knee pain 10/14/2011   Parkinson's disease (HCC) 07/24/2019   Pes anserine bursitis 10/14/2011   Primary osteoarthritis involving multiple joints 11/25/2017   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(HCC)    RLS (restless legs syndrome) 10/10/2019   Thyroid nodule 04/12/2012   Tremor 10/10/2019   Past Surgical History:  Procedure Laterality Date   APPENDECTOMY     AUGMENTATION MAMMAPLASTY     saline - 2015   CHOLECYSTECTOMY     ECTOPIC PREGNANCY SURGERY     KNEE SURGERY     LEFT HEART CATH AND CORONARY ANGIOGRAPHY N/A 03/11/2021   Procedure: LEFT  HEART CATH AND CORONARY ANGIOGRAPHY;  Surgeon: Lennette Bihari, MD;  Location: [facility];  Service: Cardiovascular;  Laterality: N/A;   TONSILLECTOMY     Patient Active Problem List   Diagnosis Date Noted   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) 05/11/2021   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(HCC) 03/30/2021   IBS (irritable bowel syndrome) 03/12/2021   Elevated troponin    RLS (restless legs syndrome) 10/10/2019   Tremor 10/10/2019   Parkinson's disease (HCC) 07/24/2019   Primary osteoarthritis involving multiple joints 11/25/2017   Arthralgia of both hands 11/22/2017   Esophageal dysphagia 01/13/2017   Crohn's disease of ileum without complication (HCC) 03/30/2016   Absence of bladder continence 11/15/2014   Cephalalgia 11/15/2014   Incontinence in female 11/12/2014   Adjustment disorder with anxiety 02/17/2013   Chest pain 04/12/2012   HTN (hypertension) 04/12/2012   Hypothyroidism 04/12/2012   Hyperlipemia 04/12/2012   Thyroid nodule 04/12/2012   Hyperlipidemia 04/12/2012   Acquired hypothyroidism 04/12/2012   Left knee pain 10/14/2011   Pes anserine bursitis 10/14/2011   Arterial fibromuscular dysplasia (HCC) 08/25/2011   Benign renovascular hypertension 06/23/2011    PCP: Malka So., MD   REFERRING PROVIDER: Beverely Low, MD   REFERRING DIAG: [phone] (ICD-10-CM) - Other specified arthritis, right knee   THERAPY DIAG:  Difficulty in walking, not elsewhere classified  Muscle weakness (generalized)  Stiffness of right knee, not  elsewhere classified  Unsteadiness on feet  Localized edema  Rationale for Evaluation and Treatment: Rehabilitation  ONSET DATE: 06/13/23  SUBJECTIVE:   SUBJECTIVE STATEMENT: "Good"  PERTINENT HISTORY: PMHx: tremors, OA, HTN, R TKR 06/13/23 PAIN:  Are you having pain? Yes: NPRS scale: 2/10 Pain location: knee Pain description: Sharp, aching Aggravating factors: Being still and then trying to move Relieving  factors: ice, pain meds.  PRECAUTIONS: Knee  RED FLAGS: None   WEIGHT BEARING RESTRICTIONS: Yes WBAT  FALLS:  Has patient fallen in last 6 months? No  LIVING ENVIRONMENT: Lives with: lives with their family Lives in: House/apartment Stairs: Yes: Internal: 14 steps; on right going up and External: 1 steps; none Has following equipment at home: Dan Humphreys - 2 wheeled  OCCUPATION: Works in an office. Mostly seated.  PLOF: Independent  PATIENT GOALS: Patient would like to recover her ROM and strength. Get back to golf, swimming, diving.  NEXT MD VISIT: Next Thursday, 10/10  OBJECTIVE:  Note: Objective measures were completed at Evaluation unless otherwise noted.  DIAGNOSTIC FINDINGS: N/A  COGNITION: Overall cognitive status: Within functional limits for tasks assessed     SENSATION: WFL  EDEMA:  Normal edema noted.  POSTURE: slightly flexed over walker, decreased WB through RLE  PALPATION: R LE taught throughout foot to thigh, tender points where skin is drawn tight, improved with gentle retrograde massage.  INCISION: clean, dry, no redness noted. Waterproof dressing applied and ace wraps re-applied per patient request vs the TED hose.  LOWER EXTREMITY ROM: Grossly WNL except R knee  Active ROM Right eval Left eval R knee 06/29/23 R Knee 07/14/23 R knee 07/18/23 R knee 08/03/23  Hip flexion        Hip extension        Hip abduction        Hip adduction        Hip internal rotation        Hip external rotation        Knee flexion   10 8 8 8   Knee extension 21 88 101 120 120 122  Ankle dorsiflexion        Ankle plantarflexion        Ankle inversion        Ankle eversion         (Blank rows = not tested)  LOWER EXTREMITY MMT: L 5/5  MMT Right eval Right 07/14/23 Right 07/18/23 Right 08/03/23  Hip flexion 3+     Hip extension 3+     Hip abduction 3+     Hip adduction      Hip internal rotation      Hip external rotation      Knee flexion 3 4+ 4+ 5  Knee  extension 3 4 4  4+  Ankle dorsiflexion 3+     Ankle plantarflexion      Ankle inversion      Ankle eversion       GAIT: Distance walked: In clinic distances Assistive device utilized: Walker - 2 wheeled Level of assistance: Modified independence Comments: Patient walks with step through gait pattern decreased R stance and decreased WB trough RLE   TODAY'S TREATMENT:  DATE:  08/03/23 Bike L3 x 6 min Resisted gait 30lb 4 way x 3 each 6in box on airex step ups x10 each Leg press 50lb 2x12  Vas R knee med pressure x 10 min  08/01/23 NuStep L5 x4 min Bike L3 x 4 min Lateral 8in step ups from airex x 10 each  Step ups airex on 6in box x10 each Heel raises 2x15 Leg press 50lb 2x12 R knee PROM wih end range holds  Patella mobs  Tibiofemoral mobs Vas R knee med pressure x 10 min  07/28/23 Bike L2 x6 min Step ups airex on 6in box x10 each Lateral 8in step ups from airex x 10 each  Heel raises 2x10 R knee PROM wih end range holds  Patella mobs  Tibiofemoral mobs Vas R knee med pressure x 10 min  07/25/23 NuStep L 5 x 5 min  Bike L 2.5 x4 mins R knee PROM wih end range holds  Patella mo  Tibiofemoral mobs S2S holding yellow ball 2x10 HS curls 25lb 2x10, RLE 15lb x10  Rle Leg Ext 5lb x10 Vas R knee med pressure x 10 min  07/20/23 Gait around back building widest part of lot brisk pace RLE lateral eccentric step downs 2x10 R knee PROM wih end range holds  Patella mo  Tibiofemoral mobs Quad sets RLE x10 LAQ RLE 5lb 2x10 Leg press 30lb x20 Vas R knee low pressure x 10 min     PATIENT EDUCATION:  Education details: POC, review HEP Person educated: Patient Education method: Explanation Education comprehension: verbalized understanding  HOME EXERCISE PROGRAM:  [id number]  ASSESSMENT:  CLINICAL IMPRESSION: Patient is a 72 y.o. who was  seen today for physical therapy  treatment for PT S/P R TKR 06/13/23. She Continues to progress with R knee strength and ROM.  Pt still lacks full R knee Ext. Compensatory strategies noted with step ups when stepping up with RLE. Instability present with resisted gait, could be due to the increase in weight, but was able to complete well.  She will benefit from PT to facilitate progression through the TKR rehab process to be able to return to her PLOF.   OBJECTIVE IMPAIRMENTS: Abnormal gait, decreased activity tolerance, decreased balance, decreased coordination, decreased endurance, difficulty walking, decreased ROM, decreased strength, increased muscle spasms, impaired flexibility, improper body mechanics, postural dysfunction, and pain.   ACTIVITY LIMITATIONS: lifting, bending, sitting, standing, squatting, stairs, transfers, bed mobility, and locomotion level  PARTICIPATION LIMITATIONS: meal prep, cleaning, laundry, driving, shopping, and community activity  PERSONAL FACTORS: Past/current experiences are also affecting patient's functional outcome.   REHAB POTENTIAL: Good  CLINICAL DECISION MAKING: Evolving/moderate complexity  EVALUATION COMPLEXITY: Moderate   GOALS: Goals reviewed with patient? Yes  SHORT TERM GOALS: Target date: 07/01/23 I with initial HEP Baseline: Goal status: Met 06/29/23  LONG TERM GOALS: Target date: 09/08/23  I with final HEP Baseline:  Goal status: INITIAL  2.  Increase R knee AROM to 0-120 Baseline:  Goal status: Partly Met 07/18/23  3.  Increase R knee strength to at least 4+/5 Baseline:  Goal status: Met 08/03/23  4.  Patient will be able to walk x at least 400' MI, with LRAD, on level and unlevel surfaces. Baseline:  Goal status: Met  07/28/23  5.  Patient will ambulate up and down at least 12 steps using U rail and step over step, MI Baseline:  Goal status: Progressing 07/06/23  6.  Patient will report initiation of return to golf with  putting  and practice swings at home, without pain or swelling. Baseline:  Goal status: Ongoing have not played 08/03/23   PLAN:  PT FREQUENCY: 2-3x/week  PT DURATION: 12 weeks  PLANNED INTERVENTIONS: Therapeutic exercises, Therapeutic activity, Neuromuscular re-education, Balance training, Gait training, Patient/Family education, Self Care, Joint mobilization, Stair training, Electrical stimulation, Cryotherapy, Moist heat, scar mobilization, Taping, Vasopneumatic device, Ionotophoresis 4mg /ml Dexamethasone, and Manual therapy  PLAN FOR NEXT SESSION: Progress strength and ROM activities  Debroah Baller, PTA 08/03/2023, 1:46 PM

## 2023-08-04 ENCOUNTER — Ambulatory Visit: Payer: Medicare Other | Admitting: Physical Therapy

## 2023-08-08 ENCOUNTER — Ambulatory Visit: Payer: Medicare Other | Admitting: Physical Therapy

## 2023-08-08 ENCOUNTER — Encounter: Payer: Self-pay | Admitting: Physical Therapy

## 2023-08-08 DIAGNOSIS — R262 Difficulty in walking, not elsewhere classified: Secondary | ICD-10-CM

## 2023-08-08 DIAGNOSIS — R600 Localized edema: Secondary | ICD-10-CM

## 2023-08-08 DIAGNOSIS — M25661 Stiffness of right knee, not elsewhere classified: Secondary | ICD-10-CM

## 2023-08-08 DIAGNOSIS — M6281 Muscle weakness (generalized): Secondary | ICD-10-CM

## 2023-08-08 DIAGNOSIS — R2681 Unsteadiness on feet: Secondary | ICD-10-CM

## 2023-08-08 NOTE — Therapy (Signed)
OUTPATIENT PHYSICAL THERAPY LOWER EXTREMITY TREATMENT     Patient Name: Melinda Lane MRN: [id number] DOB:Nov 18, 1950, 72 y.o., female Today's Date: 08/08/2023  END OF SESSION:  PT End of Session - 08/08/23 1308     Visit Number 16    Date for PT Re-Evaluation 09/08/23    PT Start Time 1305    PT Stop Time 1345    PT Time Calculation (min) 40 min    Activity Tolerance Patient tolerated treatment well    Behavior During Therapy [facility] for tasks assessed/performed             Past Medical History:  Diagnosis Date   Absence of bladder continence 11/15/2014   Acquired hypothyroidism 04/12/2012   Adjustment disorder with anxiety 02/17/2013   patient states she has no known diagnosis of this, doesn't take medication for this, states her primary care doesn't even have it noted in their records   Arterial fibromuscular dysplasia (HCC) 08/25/2011   Arthralgia of both hands 11/22/2017   Benign renovascular hypertension 06/23/2011   Formatting of this note might be different from the original. right   Cephalalgia 11/15/2014   Chest pain 04/12/2012   Crohn's disease of ileum without complication (HCC) 03/30/2016   Elevated troponin    Esophageal dysphagia 01/13/2017   HTN (hypertension) 04/12/2012   Hyperlipemia 04/12/2012   Hyperlipidemia 04/12/2012   Hypothyroidism    IBS (irritable bowel syndrome)    Incontinence in female 11/12/2014   Left knee pain 10/14/2011   Parkinson's disease (HCC) 07/24/2019   Pes anserine bursitis 10/14/2011   Primary osteoarthritis involving multiple joints 11/25/2017   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(HCC)    RLS (restless legs syndrome) 10/10/2019   Thyroid nodule 04/12/2012   Tremor 10/10/2019   Past Surgical History:  Procedure Laterality Date   APPENDECTOMY     AUGMENTATION MAMMAPLASTY     saline - 2015   CHOLECYSTECTOMY     ECTOPIC PREGNANCY SURGERY     KNEE SURGERY     LEFT HEART CATH AND CORONARY ANGIOGRAPHY N/A 03/11/2021   Procedure: LEFT  HEART CATH AND CORONARY ANGIOGRAPHY;  Surgeon: Lennette Bihari, MD;  Location: [facility];  Service: Cardiovascular;  Laterality: N/A;   TONSILLECTOMY     Patient Active Problem List   Diagnosis Date Noted   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) 05/11/2021   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(HCC) 03/30/2021   IBS (irritable bowel syndrome) 03/12/2021   Elevated troponin    RLS (restless legs syndrome) 10/10/2019   Tremor 10/10/2019   Parkinson's disease (HCC) 07/24/2019   Primary osteoarthritis involving multiple joints 11/25/2017   Arthralgia of both hands 11/22/2017   Esophageal dysphagia 01/13/2017   Crohn's disease of ileum without complication (HCC) 03/30/2016   Absence of bladder continence 11/15/2014   Cephalalgia 11/15/2014   Incontinence in female 11/12/2014   Adjustment disorder with anxiety 02/17/2013   Chest pain 04/12/2012   HTN (hypertension) 04/12/2012   Hypothyroidism 04/12/2012   Hyperlipemia 04/12/2012   Thyroid nodule 04/12/2012   Hyperlipidemia 04/12/2012   Acquired hypothyroidism 04/12/2012   Left knee pain 10/14/2011   Pes anserine bursitis 10/14/2011   Arterial fibromuscular dysplasia (HCC) 08/25/2011   Benign renovascular hypertension 06/23/2011    PCP: Malka So., MD   REFERRING PROVIDER: Beverely Low, MD   REFERRING DIAG: [phone] (ICD-10-CM) - Other specified arthritis, right knee   THERAPY DIAG:  Difficulty in walking, not elsewhere classified  Muscle weakness (generalized)  Unsteadiness on feet  Localized  edema  Stiffness of right knee, not elsewhere classified  Rationale for Evaluation and Treatment: Rehabilitation  ONSET DATE: 06/13/23  SUBJECTIVE:   SUBJECTIVE STATEMENT: "Good" did a lot over the weekend  PERTINENT HISTORY: PMHx: tremors, OA, HTN, R TKR 06/13/23 PAIN:  Are you having pain? Yes: NPRS scale: 2/10 Pain location: knee Pain description: Sharp, aching Aggravating factors: Being still and  then trying to move Relieving factors: ice, pain meds.  PRECAUTIONS: Knee  RED FLAGS: None   WEIGHT BEARING RESTRICTIONS: Yes WBAT  FALLS:  Has patient fallen in last 6 months? No  LIVING ENVIRONMENT: Lives with: lives with their family Lives in: House/apartment Stairs: Yes: Internal: 14 steps; on right going up and External: 1 steps; none Has following equipment at home: Dan Humphreys - 2 wheeled  OCCUPATION: Works in an office. Mostly seated.  PLOF: Independent  PATIENT GOALS: Patient would like to recover her ROM and strength. Get back to golf, swimming, diving.  NEXT MD VISIT: Next Thursday, 10/10  OBJECTIVE:  Note: Objective measures were completed at Evaluation unless otherwise noted.  DIAGNOSTIC FINDINGS: N/A  COGNITION: Overall cognitive status: Within functional limits for tasks assessed     SENSATION: WFL  EDEMA:  Normal edema noted.  POSTURE: slightly flexed over walker, decreased WB through RLE  PALPATION: R LE taught throughout foot to thigh, tender points where skin is drawn tight, improved with gentle retrograde massage.  INCISION: clean, dry, no redness noted. Waterproof dressing applied and ace wraps re-applied per patient request vs the TED hose.  LOWER EXTREMITY ROM: Grossly WNL except R knee  Active ROM Right eval Left eval R knee 06/29/23 R Knee 07/14/23 R knee 07/18/23 R knee 08/03/23  Hip flexion        Hip extension        Hip abduction        Hip adduction        Hip internal rotation        Hip external rotation        Knee flexion   10 8 8 8   Knee extension 21 88 101 120 120 122  Ankle dorsiflexion        Ankle plantarflexion        Ankle inversion        Ankle eversion         (Blank rows = not tested)  LOWER EXTREMITY MMT: L 5/5  MMT Right eval Right 07/14/23 Right 07/18/23 Right 08/03/23  Hip flexion 3+     Hip extension 3+     Hip abduction 3+     Hip adduction      Hip internal rotation      Hip external rotation       Knee flexion 3 4+ 4+ 5  Knee extension 3 4 4  4+  Ankle dorsiflexion 3+     Ankle plantarflexion      Ankle inversion      Ankle eversion       GAIT: Distance walked: In clinic distances Assistive device utilized: Walker - 2 wheeled Level of assistance: Modified independence Comments: Patient walks with step through gait pattern decreased R stance and decreased WB trough RLE   TODAY'S TREATMENT:  DATE:  08/08/23 Bike L 3.5 x6 min Leg press 50lb 2x10 HS curls 25lb 2x10, RLE 15lb x10  RLE Leg Ext 5lb x10, x5 R knee PROM wih end range holds  Patella mobs  Tibiofemoral mobs Vas R knee med pressure x 10 min   08/03/23 Bike L3 x 6 min Resisted gait 30lb 4 way x 3 each 6in box on airex step ups x10 each Leg press 50lb 2x12  Vas R knee med pressure x 10 min  08/01/23 NuStep L5 x4 min Bike L3 x 4 min Lateral 8in step ups from airex x 10 each  Step ups airex on 6in box x10 each Heel raises 2x15 Leg press 50lb 2x12 R knee PROM wih end range holds  Patella mobs  Tibiofemoral mobs Vas R knee med pressure x 10 min  07/28/23 Bike L2 x6 min Step ups airex on 6in box x10 each Lateral 8in step ups from airex x 10 each  Heel raises 2x10 R knee PROM wih end range holds  Patella mobs  Tibiofemoral mobs Vas R knee med pressure x 10 min  07/25/23 NuStep L 5 x 5 min  Bike L 2.5 x4 mins R knee PROM wih end range holds  Patella mo  Tibiofemoral mobs S2S holding yellow ball 2x10 HS curls 25lb 2x10, RLE 15lb x10  Rle Leg Ext 5lb x10 Vas R knee med pressure x 10 min  07/20/23 Gait around back building widest part of lot brisk pace RLE lateral eccentric step downs 2x10 R knee PROM wih end range holds  Patella mo  Tibiofemoral mobs Quad sets RLE x10 LAQ RLE 5lb 2x10 Leg press 30lb x20 Vas R knee low pressure x 10 min     PATIENT EDUCATION:   Education details: POC, review HEP Person educated: Patient Education method: Explanation Education comprehension: verbalized understanding  HOME EXERCISE PROGRAM:  [id number]  ASSESSMENT:  CLINICAL IMPRESSION: Patient is a 72 y.o. who was seen today for physical therapy treatment for PT S/P R TKR 06/13/23. Session consisted of LE strengthening and ROM. Mote emphasis place on RLE isolation with machine level interventions. R knee flexion remains well but she continues to lack full knee ext. Pain reported at end rage of passive extension.  She will benefit from PT to facilitate progression through the TKR rehab process to be able to return to her PLOF.   OBJECTIVE IMPAIRMENTS: Abnormal gait, decreased activity tolerance, decreased balance, decreased coordination, decreased endurance, difficulty walking, decreased ROM, decreased strength, increased muscle spasms, impaired flexibility, improper body mechanics, postural dysfunction, and pain.   ACTIVITY LIMITATIONS: lifting, bending, sitting, standing, squatting, stairs, transfers, bed mobility, and locomotion level  PARTICIPATION LIMITATIONS: meal prep, cleaning, laundry, driving, shopping, and community activity  PERSONAL FACTORS: Past/current experiences are also affecting patient's functional outcome.   REHAB POTENTIAL: Good  CLINICAL DECISION MAKING: Evolving/moderate complexity  EVALUATION COMPLEXITY: Moderate   GOALS: Goals reviewed with patient? Yes  SHORT TERM GOALS: Target date: 07/01/23 I with initial HEP Baseline: Goal status: Met 06/29/23  LONG TERM GOALS: Target date: 09/08/23  I with final HEP Baseline:  Goal status: INITIAL  2.  Increase R knee AROM to 0-120 Baseline:  Goal status: Partly Met 07/18/23  3.  Increase R knee strength to at least 4+/5 Baseline:  Goal status: Met 08/03/23  4.  Patient will be able to walk x at least 400' MI, with LRAD, on level and unlevel surfaces. Baseline:  Goal status: Met   07/28/23  5.  Patient  will ambulate up and down at least 12 steps using U rail and step over step, MI Baseline:  Goal status: Progressing 07/06/23  6.  Patient will report initiation of return to golf with putting and practice swings at home, without pain or swelling. Baseline:  Goal status: Ongoing have not played 08/03/23   PLAN:  PT FREQUENCY: 2-3x/week  PT DURATION: 12 weeks  PLANNED INTERVENTIONS: Therapeutic exercises, Therapeutic activity, Neuromuscular re-education, Balance training, Gait training, Patient/Family education, Self Care, Joint mobilization, Stair training, Electrical stimulation, Cryotherapy, Moist heat, scar mobilization, Taping, Vasopneumatic device, Ionotophoresis 4mg /ml Dexamethasone, and Manual therapy  PLAN FOR NEXT SESSION: Progress strength and ROM activities  Debroah Baller, PTA 08/08/2023, 1:08 PM

## 2023-08-10 ENCOUNTER — Ambulatory Visit: Payer: Medicare Other | Admitting: Physical Therapy

## 2023-08-10 ENCOUNTER — Encounter: Payer: Self-pay | Admitting: Physical Therapy

## 2023-08-10 DIAGNOSIS — R2681 Unsteadiness on feet: Secondary | ICD-10-CM

## 2023-08-10 DIAGNOSIS — R600 Localized edema: Secondary | ICD-10-CM

## 2023-08-10 DIAGNOSIS — R262 Difficulty in walking, not elsewhere classified: Secondary | ICD-10-CM | POA: Diagnosis not present

## 2023-08-10 DIAGNOSIS — M6281 Muscle weakness (generalized): Secondary | ICD-10-CM

## 2023-08-10 NOTE — Therapy (Signed)
OUTPATIENT PHYSICAL THERAPY LOWER EXTREMITY TREATMENT     Patient Name: Melinda Lane MRN: [id number] DOB:[birth year], 72 y.o., female Today's Date: 08/10/2023  END OF SESSION:  PT End of Session - 08/10/23 1447     Visit Number 17    Date for PT Re-Evaluation 09/08/23    PT Start Time 1445    PT Stop Time 1515    PT Time Calculation (min) 30 min    Activity Tolerance Patient tolerated treatment well    Behavior During Therapy [facility] for tasks assessed/performed             Past Medical History:  Diagnosis Date   Absence of bladder continence 11/15/2014   Acquired hypothyroidism 04/12/2012   Adjustment disorder with anxiety 02/17/2013   patient states she has no known diagnosis of this, doesn't take medication for this, states her primary care doesn't even have it noted in their records   Arterial fibromuscular dysplasia (HCC) 08/25/2011   Arthralgia of both hands 11/22/2017   Benign renovascular hypertension 06/23/2011   Formatting of this note might be different from the original. right   Cephalalgia 11/15/2014   Chest pain 04/12/2012   Crohn's disease of ileum without complication (HCC) 03/30/2016   Elevated troponin    Esophageal dysphagia 01/13/2017   HTN (hypertension) 04/12/2012   Hyperlipemia 04/12/2012   Hyperlipidemia 04/12/2012   Hypothyroidism    IBS (irritable bowel syndrome)    Incontinence in female 11/12/2014   Left knee pain 10/14/2011   Parkinson's disease (HCC) 07/24/2019   Pes anserine bursitis 10/14/2011   Primary osteoarthritis involving multiple joints 11/25/2017   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(HCC)    RLS (restless legs syndrome) 10/10/2019   Thyroid nodule 04/12/2012   Tremor 10/10/2019   Past Surgical History:  Procedure Laterality Date   APPENDECTOMY     AUGMENTATION MAMMAPLASTY     saline - 2015   CHOLECYSTECTOMY     ECTOPIC PREGNANCY SURGERY     KNEE SURGERY     LEFT HEART CATH AND CORONARY ANGIOGRAPHY N/A 03/11/2021   Procedure: LEFT  HEART CATH AND CORONARY ANGIOGRAPHY;  Surgeon: Lennette Bihari, MD;  Location: [facility];  Service: Cardiovascular;  Laterality: N/A;   TONSILLECTOMY     Patient Active Problem List   Diagnosis Date Noted   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) 05/11/2021   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(HCC) 03/30/2021   IBS (irritable bowel syndrome) 03/12/2021   Elevated troponin    RLS (restless legs syndrome) 10/10/2019   Tremor 10/10/2019   Parkinson's disease (HCC) 07/24/2019   Primary osteoarthritis involving multiple joints 11/25/2017   Arthralgia of both hands 11/22/2017   Esophageal dysphagia 01/13/2017   Crohn's disease of ileum without complication (HCC) 03/30/2016   Absence of bladder continence 11/15/2014   Cephalalgia 11/15/2014   Incontinence in female 11/12/2014   Adjustment disorder with anxiety 02/17/2013   Chest pain 04/12/2012   HTN (hypertension) 04/12/2012   Hypothyroidism 04/12/2012   Hyperlipemia 04/12/2012   Thyroid nodule 04/12/2012   Hyperlipidemia 04/12/2012   Acquired hypothyroidism 04/12/2012   Left knee pain 10/14/2011   Pes anserine bursitis 10/14/2011   Arterial fibromuscular dysplasia (HCC) 08/25/2011   Benign renovascular hypertension 06/23/2011    PCP: Malka So., MD   REFERRING PROVIDER: Beverely Low, MD   REFERRING DIAG: [phone] (ICD-10-CM) - Other specified arthritis, right knee   THERAPY DIAG:  Difficulty in walking, not elsewhere classified  Muscle weakness (generalized)  Unsteadiness on feet  Localized  edema  Rationale for Evaluation and Treatment: Rehabilitation  ONSET DATE: 06/13/23  SUBJECTIVE:   SUBJECTIVE STATEMENT: "Good" did a lot over the weekend  PERTINENT HISTORY: PMHx: tremors, OA, HTN, R TKR 06/13/23 PAIN:  Are you having pain? Yes: NPRS scale: 2/10 Pain location: knee Pain description: Sharp, aching Aggravating factors: Being still and then trying to move Relieving factors: ice, pain  meds.  PRECAUTIONS: Knee  RED FLAGS: None   WEIGHT BEARING RESTRICTIONS: Yes WBAT  FALLS:  Has patient fallen in last 6 months? No  LIVING ENVIRONMENT: Lives with: lives with their family Lives in: House/apartment Stairs: Yes: Internal: 14 steps; on right going up and External: 1 steps; none Has following equipment at home: Dan Humphreys - 2 wheeled  OCCUPATION: Works in an office. Mostly seated.  PLOF: Independent  PATIENT GOALS: Patient would like to recover her ROM and strength. Get back to golf, swimming, diving.  NEXT MD VISIT: Next Thursday, 10/10  OBJECTIVE:  Note: Objective measures were completed at Evaluation unless otherwise noted.  DIAGNOSTIC FINDINGS: N/A  COGNITION: Overall cognitive status: Within functional limits for tasks assessed     SENSATION: WFL  EDEMA:  Normal edema noted.  POSTURE: slightly flexed over walker, decreased WB through RLE  PALPATION: R LE taught throughout foot to thigh, tender points where skin is drawn tight, improved with gentle retrograde massage.  INCISION: clean, dry, no redness noted. Waterproof dressing applied and ace wraps re-applied per patient request vs the TED hose.  LOWER EXTREMITY ROM: Grossly WNL except R knee  Active ROM Right eval Left eval R knee 06/29/23 R Knee 07/14/23 R knee 07/18/23 R knee 08/03/23 R knee 08/10/23  Hip flexion         Hip extension         Hip abduction         Hip adduction         Hip internal rotation         Hip external rotation         Knee flexion   10 8 8 8 10   Knee extension 21 88 101 120 120 122 122  Ankle dorsiflexion         Ankle plantarflexion         Ankle inversion         Ankle eversion          (Blank rows = not tested)  LOWER EXTREMITY MMT: L 5/5  MMT Right eval Right 07/14/23 Right 07/18/23 Right 08/03/23  Hip flexion 3+     Hip extension 3+     Hip abduction 3+     Hip adduction      Hip internal rotation      Hip external rotation      Knee flexion 3  4+ 4+ 5  Knee extension 3 4 4  4+  Ankle dorsiflexion 3+     Ankle plantarflexion      Ankle inversion      Ankle eversion       GAIT: Distance walked: In clinic distances Assistive device utilized: Walker - 2 wheeled Level of assistance: Modified independence Comments: Patient walks with step through gait pattern decreased R stance and decreased WB trough RLE   TODAY'S TREATMENT:  DATE:  08/10/03 Bike L 3.5 x6 min 5x sit to stand 18.24 seconds  TUG 11.29 sec  BERG 46/56 Leg press 50lb 2x10  Vas R knee med pressure x 10 min  08/08/23 Bike L 3.5 x6 min Leg press 50lb 2x10 HS curls 25lb 2x10, RLE 15lb x10  RLE Leg Ext 5lb x10, x5 R knee PROM wih end range holds  Patella mobs  Tibiofemoral mobs Vas R knee med pressure x 10 min  08/03/23 Bike L3 x 6 min Resisted gait 30lb 4 way x 3 each 6in box on airex step ups x10 each Leg press 50lb 2x12  Vas R knee med pressure x 10 min  08/01/23 NuStep L5 x4 min Bike L3 x 4 min Lateral 8in step ups from airex x 10 each  Step ups airex on 6in box x10 each Heel raises 2x15 Leg press 50lb 2x12 R knee PROM wih end range holds  Patella mobs  Tibiofemoral mobs Vas R knee med pressure x 10 min  07/28/23 Bike L2 x6 min Step ups airex on 6in box x10 each Lateral 8in step ups from airex x 10 each  Heel raises 2x10 R knee PROM wih end range holds  Patella mobs  Tibiofemoral mobs Vas R knee med pressure x 10 min  07/25/23 NuStep L 5 x 5 min  Bike L 2.5 x4 mins R knee PROM wih end range holds  Patella mo  Tibiofemoral mobs S2S holding yellow ball 2x10 HS curls 25lb 2x10, RLE 15lb x10  Rle Leg Ext 5lb x10 Vas R knee med pressure x 10 min   PATIENT EDUCATION:  Education details: POC, review HEP Person educated: Patient Education method: Explanation Education comprehension: verbalized  understanding  HOME EXERCISE PROGRAM:  [id number]  ASSESSMENT:  CLINICAL IMPRESSION: Patient is a 72 y.o. who was seen today for physical therapy treatment for PT S/P R TKR 06/13/23. Moderate fall frisk with BERG. Pt has good flexion ROM but continues to lack extension  R Quad eccentric load weakness is evident when descending stairs. Pain reported at end rage of passive extension.  She will benefit from PT to facilitate progression through the TKR rehab process to be able to return to her PLOF.  Pt is a avid golfer and would like to return.  OBJECTIVE IMPAIRMENTS: Abnormal gait, decreased activity tolerance, decreased balance, decreased coordination, decreased endurance, difficulty walking, decreased ROM, decreased strength, increased muscle spasms, impaired flexibility, improper body mechanics, postural dysfunction, and pain.   ACTIVITY LIMITATIONS: lifting, bending, sitting, standing, squatting, stairs, transfers, bed mobility, and locomotion level  PARTICIPATION LIMITATIONS: meal prep, cleaning, laundry, driving, shopping, and community activity  PERSONAL FACTORS: Past/current experiences are also affecting patient's functional outcome.   REHAB POTENTIAL: Good  CLINICAL DECISION MAKING: Evolving/moderate complexity  EVALUATION COMPLEXITY: Moderate   GOALS: Goals reviewed with patient? Yes  SHORT TERM GOALS: Target date: 07/01/23 I with initial HEP Baseline: Goal status: Met 06/29/23  LONG TERM GOALS: Target date: 09/08/23  I with final HEP Baseline:  Goal status: INITIAL  2.  Increase R knee AROM to 0-120 Baseline:  Goal status: Partly Met 08/10/23  Lacks extension   3.  Increase R knee strength to at least 4+/5 Baseline:  Goal status: Met 08/03/23  4.  Patient will be able to walk x at least 400' MI, with LRAD, on level and unlevel surfaces. Baseline:  Goal status: Met  07/28/23 slight limp at times  5.  Patient will ambulate up and down at least 12  steps using U  rail and step over step, MI Baseline:  Goal status: Progressing 08/10/23  6.  Patient will report initiation of return to golf with putting and practice swings at home, without pain or swelling. Baseline:  Goal status: Ongoing have not played 08/03/23   PLAN:  PT FREQUENCY: 2-3x/week  PT DURATION: 12 weeks  PLANNED INTERVENTIONS: Therapeutic exercises, Therapeutic activity, Neuromuscular re-education, Balance training, Gait training, Patient/Family education, Self Care, Joint mobilization, Stair training, Electrical stimulation, Cryotherapy, Moist heat, scar mobilization, Taping, Vasopneumatic device, Ionotophoresis 4mg /ml Dexamethasone, and Manual therapy  PLAN FOR NEXT SESSION: Progress strength and ROM activities  Debroah Baller, PTA 08/10/2023, 2:48 PM

## 2023-08-17 ENCOUNTER — Encounter: Payer: Self-pay | Admitting: Physical Therapy

## 2023-08-17 ENCOUNTER — Ambulatory Visit: Payer: Medicare Other | Attending: Orthopedic Surgery | Admitting: Physical Therapy

## 2023-08-17 DIAGNOSIS — R2681 Unsteadiness on feet: Secondary | ICD-10-CM | POA: Diagnosis present

## 2023-08-17 DIAGNOSIS — M6281 Muscle weakness (generalized): Secondary | ICD-10-CM | POA: Insufficient documentation

## 2023-08-17 DIAGNOSIS — R262 Difficulty in walking, not elsewhere classified: Secondary | ICD-10-CM | POA: Diagnosis present

## 2023-08-17 DIAGNOSIS — R600 Localized edema: Secondary | ICD-10-CM | POA: Insufficient documentation

## 2023-08-17 DIAGNOSIS — M25661 Stiffness of right knee, not elsewhere classified: Secondary | ICD-10-CM | POA: Diagnosis present

## 2023-08-17 NOTE — Therapy (Signed)
OUTPATIENT PHYSICAL THERAPY LOWER EXTREMITY TREATMENT     Patient Name: Melinda Lane MRN: [id number] DOB:1951-06-18, 72 y.o., female Today's Date: 08/17/2023  END OF SESSION:  PT End of Session - 08/17/23 1551     Visit Number 18    Date for PT Re-Evaluation 09/14/23    PT Start Time 1547    PT Stop Time 1627    PT Time Calculation (min) 40 min    Activity Tolerance Patient tolerated treatment well    Behavior During Therapy [facility] for tasks assessed/performed            Past Medical History:  Diagnosis Date   Absence of bladder continence 11/15/2014   Acquired hypothyroidism 04/12/2012   Adjustment disorder with anxiety 02/17/2013   patient states she has no known diagnosis of this, doesn't take medication for this, states her primary care doesn't even have it noted in their records   Arterial fibromuscular dysplasia (HCC) 08/25/2011   Arthralgia of both hands 11/22/2017   Benign renovascular hypertension 06/23/2011   Formatting of this note might be different from the original. right   Cephalalgia 11/15/2014   Chest pain 04/12/2012   Crohn's disease of ileum without complication (HCC) 03/30/2016   Elevated troponin    Esophageal dysphagia 01/13/2017   HTN (hypertension) 04/12/2012   Hyperlipemia 04/12/2012   Hyperlipidemia 04/12/2012   Hypothyroidism    IBS (irritable bowel syndrome)    Incontinence in female 11/12/2014   Left knee pain 10/14/2011   Parkinson's disease (HCC) 07/24/2019   Pes anserine bursitis 10/14/2011   Primary osteoarthritis involving multiple joints 11/25/2017   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(HCC)    RLS (restless legs syndrome) 10/10/2019   Thyroid nodule 04/12/2012   Tremor 10/10/2019   Past Surgical History:  Procedure Laterality Date   APPENDECTOMY     AUGMENTATION MAMMAPLASTY     saline - 2015   CHOLECYSTECTOMY     ECTOPIC PREGNANCY SURGERY     KNEE SURGERY     LEFT HEART CATH AND CORONARY ANGIOGRAPHY N/A 03/11/2021   Procedure: LEFT  HEART CATH AND CORONARY ANGIOGRAPHY;  Surgeon: Lennette Bihari, MD;  Location: [facility];  Service: Cardiovascular;  Laterality: N/A;   TONSILLECTOMY     Patient Active Problem List   Diagnosis Date Noted   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) 05/11/2021   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(HCC) 03/30/2021   IBS (irritable bowel syndrome) 03/12/2021   Elevated troponin    RLS (restless legs syndrome) 10/10/2019   Tremor 10/10/2019   Parkinson's disease (HCC) 07/24/2019   Primary osteoarthritis involving multiple joints 11/25/2017   Arthralgia of both hands 11/22/2017   Esophageal dysphagia 01/13/2017   Crohn's disease of ileum without complication (HCC) 03/30/2016   Absence of bladder continence 11/15/2014   Cephalalgia 11/15/2014   Incontinence in female 11/12/2014   Adjustment disorder with anxiety 02/17/2013   Chest pain 04/12/2012   HTN (hypertension) 04/12/2012   Hypothyroidism 04/12/2012   Hyperlipemia 04/12/2012   Thyroid nodule 04/12/2012   Hyperlipidemia 04/12/2012   Acquired hypothyroidism 04/12/2012   Left knee pain 10/14/2011   Pes anserine bursitis 10/14/2011   Arterial fibromuscular dysplasia (HCC) 08/25/2011   Benign renovascular hypertension 06/23/2011    PCP: Malka So., MD   REFERRING PROVIDER: Beverely Low, MD   REFERRING DIAG: [phone] (ICD-10-CM) - Other specified arthritis, right knee   THERAPY DIAG:  Difficulty in walking, not elsewhere classified  Muscle weakness (generalized)  Unsteadiness on feet  Localized edema  Stiffness of right knee, not elsewhere classified  Rationale for Evaluation and Treatment: Rehabilitation  ONSET DATE: 06/13/23  SUBJECTIVE:   SUBJECTIVE STATEMENT: Patient reports no new issues.  PERTINENT HISTORY: PMHx: tremors, OA, HTN, R TKR 06/13/23 PAIN:  Are you having pain? Yes: NPRS scale: 2/10 Pain location: knee Pain description: Sharp, aching Aggravating factors: Being still and then  trying to move Relieving factors: ice, pain meds.  PRECAUTIONS: Knee  RED FLAGS: None   WEIGHT BEARING RESTRICTIONS: Yes WBAT  FALLS:  Has patient fallen in last 6 months? No  LIVING ENVIRONMENT: Lives with: lives with their family Lives in: House/apartment Stairs: Yes: Internal: 14 steps; on right going up and External: 1 steps; none Has following equipment at home: Dan Humphreys - 2 wheeled  OCCUPATION: Works in an office. Mostly seated.  PLOF: Independent  PATIENT GOALS: Patient would like to recover her ROM and strength. Get back to golf, swimming, diving.  NEXT MD VISIT: Next Thursday, 10/10  OBJECTIVE:  Note: Objective measures were completed at Evaluation unless otherwise noted.  DIAGNOSTIC FINDINGS: N/A  COGNITION: Overall cognitive status: Within functional limits for tasks assessed     SENSATION: WFL  EDEMA:  Normal edema noted.  POSTURE: slightly flexed over walker, decreased WB through RLE  PALPATION: R LE taught throughout foot to thigh, tender points where skin is drawn tight, improved with gentle retrograde massage.  INCISION: clean, dry, no redness noted. Waterproof dressing applied and ace wraps re-applied per patient request vs the TED hose.  LOWER EXTREMITY ROM: Grossly WNL except R knee  Active ROM Right eval Left eval R knee 06/29/23 R Knee 07/14/23 R knee 07/18/23 R knee 08/03/23 R knee 08/10/23  Hip flexion         Hip extension         Hip abduction         Hip adduction         Hip internal rotation         Hip external rotation         Knee flexion   10 8 8 8 10   Knee extension 21 88 101 120 120 122 122  Ankle dorsiflexion         Ankle plantarflexion         Ankle inversion         Ankle eversion          (Blank rows = not tested)  LOWER EXTREMITY MMT: L 5/5  MMT Right eval Right 07/14/23 Right 07/18/23 Right 08/03/23  Hip flexion 3+     Hip extension 3+     Hip abduction 3+     Hip adduction      Hip internal rotation       Hip external rotation      Knee flexion 3 4+ 4+ 5  Knee extension 3 4 4  4+  Ankle dorsiflexion 3+     Ankle plantarflexion      Ankle inversion      Ankle eversion       GAIT: Distance walked: In clinic distances Assistive device utilized: Walker - 2 wheeled Level of assistance: Modified independence Comments: Patient walks with step through gait pattern decreased R stance and decreased WB trough RLE   TODAY'S TREATMENT:  DATE:  08/17/23 Bike L3.5 x 6 min Seated active knee flex to 127 Step down from 4" step to touch L heel to floor and back up x 10. Seated Knee press on ball for TKE, mildly tight. Seated knee ext with overpressure, heel elevated, 4 x 5 sec Repeated knee press on ball with imporved ext. Leg press, 50#, 3 x 10 BLE VASO, med press x 10 minutes for swelling and pain relief.  08/10/03 Bike L 3.5 x6 min 5x sit to stand 18.24 seconds  TUG 11.29 sec  BERG 46/56 Leg press 50lb 2x10  Vas R knee med pressure x 10 min  08/08/23 Bike L 3.5 x6 min Leg press 50lb 2x10 HS curls 25lb 2x10, RLE 15lb x10  RLE Leg Ext 5lb x10, x5 R knee PROM wih end range holds  Patella mobs  Tibiofemoral mobs Vas R knee med pressure x 10 min  08/03/23 Bike L3 x 6 min Resisted gait 30lb 4 way x 3 each 6in box on airex step ups x10 each Leg press 50lb 2x12  Vas R knee med pressure x 10 min  08/01/23 NuStep L5 x4 min Bike L3 x 4 min Lateral 8in step ups from airex x 10 each  Step ups airex on 6in box x10 each Heel raises 2x15 Leg press 50lb 2x12 R knee PROM wih end range holds  Patella mobs  Tibiofemoral mobs Vas R knee med pressure x 10 min  07/28/23 Bike L2 x6 min Step ups airex on 6in box x10 each Lateral 8in step ups from airex x 10 each  Heel raises 2x10 R knee PROM wih end range holds  Patella mobs  Tibiofemoral mobs Vas R knee med  pressure x 10 min  07/25/23 NuStep L 5 x 5 min  Bike L 2.5 x4 mins R knee PROM wih end range holds  Patella mo  Tibiofemoral mobs S2S holding yellow ball 2x10 HS curls 25lb 2x10, RLE 15lb x10  Rle Leg Ext 5lb x10 Vas R knee med pressure x 10 min   PATIENT EDUCATION:  Education details: POC, review HEP Person educated: Patient Education method: Explanation Education comprehension: verbalized understanding  HOME EXERCISE PROGRAM:  [id number]  ASSESSMENT:  CLINICAL IMPRESSION: Patient is a 72 y.o. who was seen today for physical therapy treatment for PT S/P R TKR 06/13/23. Moderate fall frisk with BERG. Treatment focused on quad strength and control as well as knee ext ROM active and passive. She does show some progression of control, will benefit from continued focus to maximize ROM and return to PLOF.  OBJECTIVE IMPAIRMENTS: Abnormal gait, decreased activity tolerance, decreased balance, decreased coordination, decreased endurance, difficulty walking, decreased ROM, decreased strength, increased muscle spasms, impaired flexibility, improper body mechanics, postural dysfunction, and pain.   ACTIVITY LIMITATIONS: lifting, bending, sitting, standing, squatting, stairs, transfers, bed mobility, and locomotion level  PARTICIPATION LIMITATIONS: meal prep, cleaning, laundry, driving, shopping, and community activity  PERSONAL FACTORS: Past/current experiences are also affecting patient's functional outcome.   REHAB POTENTIAL: Good  CLINICAL DECISION MAKING: Evolving/moderate complexity  EVALUATION COMPLEXITY: Moderate   GOALS: Goals reviewed with patient? Yes  SHORT TERM GOALS: Target date: 07/01/23 I with initial HEP Baseline: Goal status: Met 06/29/23  LONG TERM GOALS: Target date: 09/08/23  I with final HEP Baseline:  Goal status: INITIAL  2.  Increase R knee AROM to 0-120 Baseline:  Goal status: Partly Met 08/10/23  Lacks extension   3.  Increase R knee strength  to at least 4+/5 Baseline:  Goal status: Met 08/03/23  4.  Patient will be able to walk x at least 400' MI, with LRAD, on level and unlevel surfaces. Baseline:  Goal status: Met  07/28/23 slight limp at times  5.  Patient will ambulate up and down at least 12 steps using U rail and step over step, MI Baseline:  Goal status: 08/17/23-Difficulty with eccentric control of RLE on decent. Ongoing  6.  Patient will report initiation of return to golf with putting and practice swings at home, without pain or swelling. Baseline:  Goal status: Ongoing have not played 08/03/23   PLAN:  PT FREQUENCY: 2-3x/week  PT DURATION: 12 weeks  PLANNED INTERVENTIONS: Therapeutic exercises, Therapeutic activity, Neuromuscular re-education, Balance training, Gait training, Patient/Family education, Self Care, Joint mobilization, Stair training, Electrical stimulation, Cryotherapy, Moist heat, scar mobilization, Taping, Vasopneumatic device, Ionotophoresis 4mg /ml Dexamethasone, and Manual therapy  PLAN FOR NEXT SESSION: Progress strength and ROM activities  Oley Balm DPT 08/17/23 4:23 PM

## 2023-08-20 ENCOUNTER — Other Ambulatory Visit: Payer: Self-pay | Admitting: Neurology

## 2023-08-24 ENCOUNTER — Ambulatory Visit: Payer: Medicare Other | Admitting: Physical Therapy

## 2023-08-24 ENCOUNTER — Encounter: Payer: Self-pay | Admitting: Physical Therapy

## 2023-08-24 DIAGNOSIS — R600 Localized edema: Secondary | ICD-10-CM

## 2023-08-24 DIAGNOSIS — M6281 Muscle weakness (generalized): Secondary | ICD-10-CM

## 2023-08-24 DIAGNOSIS — R262 Difficulty in walking, not elsewhere classified: Secondary | ICD-10-CM

## 2023-08-24 DIAGNOSIS — R2681 Unsteadiness on feet: Secondary | ICD-10-CM

## 2023-08-24 DIAGNOSIS — M25661 Stiffness of right knee, not elsewhere classified: Secondary | ICD-10-CM

## 2023-08-24 NOTE — Therapy (Signed)
OUTPATIENT PHYSICAL THERAPY LOWER EXTREMITY TREATMENT     Patient Name: Melinda Lane MRN: [id number] DOB:[birth year], 72 y.o., female Today's Date: 08/24/2023  END OF SESSION:  PT End of Session - 08/24/23 1437     Visit Number 19    Date for PT Re-Evaluation 09/14/23    PT Start Time 1430    PT Stop Time 1515    PT Time Calculation (min) 45 min    Activity Tolerance Patient tolerated treatment well    Behavior During Therapy [facility] for tasks assessed/performed            Past Medical History:  Diagnosis Date   Absence of bladder continence 11/15/2014   Acquired hypothyroidism 04/12/2012   Adjustment disorder with anxiety 02/17/2013   patient states she has no known diagnosis of this, doesn't take medication for this, states her primary care doesn't even have it noted in their records   Arterial fibromuscular dysplasia (HCC) 08/25/2011   Arthralgia of both hands 11/22/2017   Benign renovascular hypertension 06/23/2011   Formatting of this note might be different from the original. right   Cephalalgia 11/15/2014   Chest pain 04/12/2012   Crohn's disease of ileum without complication (HCC) 03/30/2016   Elevated troponin    Esophageal dysphagia 01/13/2017   HTN (hypertension) 04/12/2012   Hyperlipemia 04/12/2012   Hyperlipidemia 04/12/2012   Hypothyroidism    IBS (irritable bowel syndrome)    Incontinence in female 11/12/2014   Left knee pain 10/14/2011   Parkinson's disease (HCC) 07/24/2019   Pes anserine bursitis 10/14/2011   Primary osteoarthritis involving multiple joints 11/25/2017   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(HCC)    RLS (restless legs syndrome) 10/10/2019   Thyroid nodule 04/12/2012   Tremor 10/10/2019   Past Surgical History:  Procedure Laterality Date   APPENDECTOMY     AUGMENTATION MAMMAPLASTY     saline - 2015   CHOLECYSTECTOMY     ECTOPIC PREGNANCY SURGERY     KNEE SURGERY     LEFT HEART CATH AND CORONARY ANGIOGRAPHY N/A 03/11/2021   Procedure: LEFT  HEART CATH AND CORONARY ANGIOGRAPHY;  Surgeon: Lennette Bihari, MD;  Location: [facility] INVAS[facility] CV [facility];  Service: Cardiovascular;  Laterality: N/A;   TONSILLECTOMY     Patient Active Problem List   Diagnosis Date Noted   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) 05/11/2021   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(HCC) 03/30/2021   IBS (irritable bowel syndrome) 03/12/2021   Elevated troponin    RLS (restless legs syndrome) 10/10/2019   Tremor 10/10/2019   Parkinson's disease (HCC) 07/24/2019   Primary osteoarthritis involving multiple joints 11/25/2017   Arthralgia of both hands 11/22/2017   Esophageal dysphagia 01/13/2017   Crohn's disease of ileum without complication (HCC) 03/30/2016   Absence of bladder continence 11/15/2014   Cephalalgia 11/15/2014   Incontinence in female 11/12/2014   Adjustment disorder with anxiety 02/17/2013   Chest pain 04/12/2012   HTN (hypertension) 04/12/2012   Hypothyroidism 04/12/2012   Hyperlipemia 04/12/2012   Thyroid nodule 04/12/2012   Hyperlipidemia 04/12/2012   Acquired hypothyroidism 04/12/2012   Left knee pain 10/14/2011   Pes anserine bursitis 10/14/2011   Arterial fibromuscular dysplasia (HCC) 08/25/2011   Benign renovascular hypertension 06/23/2011    PCP: Malka So., MD   REFERRING PROVIDER: Beverely Low, MD   REFERRING DIAG: [phone] (ICD-10-CM) - Other specified arthritis, right knee   THERAPY DIAG:  Difficulty in walking, not elsewhere classified  Muscle weakness (generalized)  Unsteadiness on feet  Localized edema  Stiffness of right knee, not elsewhere classified  Rationale for Evaluation and Treatment: Rehabilitation  ONSET DATE: 06/13/23  SUBJECTIVE:   SUBJECTIVE STATEMENT: Been stiff, thinks it is due to the rain  PERTINENT HISTORY: PMHx: tremors, OA, HTN, R TKR 06/13/23 PAIN:  Are you having pain? Yes: NPRS scale: 4/10 Pain location: knee Pain description: Sharp, aching Aggravating factors: Being still  and then trying to move Relieving factors: ice, pain meds.  PRECAUTIONS: Knee  RED FLAGS: None   WEIGHT BEARING RESTRICTIONS: Yes WBAT  FALLS:  Has patient fallen in last 6 months? No  LIVING ENVIRONMENT: Lives with: lives with their family Lives in: House/apartment Stairs: Yes: Internal: 14 steps; on right going up and External: 1 steps; none Has following equipment at home: Dan Humphreys - 2 wheeled  OCCUPATION: Works in an office. Mostly seated.  PLOF: Independent  PATIENT GOALS: Patient would like to recover her ROM and strength. Get back to golf, swimming, diving.  NEXT MD VISIT: Next Thursday, 10/10  OBJECTIVE:  Note: Objective measures were completed at Evaluation unless otherwise noted.  DIAGNOSTIC FINDINGS: N/A  COGNITION: Overall cognitive status: Within functional limits for tasks assessed     SENSATION: WFL  EDEMA:  Normal edema noted.  POSTURE: slightly flexed over walker, decreased WB through RLE  PALPATION: R LE taught throughout foot to thigh, tender points where skin is drawn tight, improved with gentle retrograde massage.  INCISION: clean, dry, no redness noted. Waterproof dressing applied and ace wraps re-applied per patient request vs the TED hose.  LOWER EXTREMITY ROM: Grossly WNL except R knee  Active ROM Right eval Left eval R knee 06/29/23 R Knee 07/14/23 R knee 07/18/23 R knee 08/03/23 R knee 08/10/23  Hip flexion         Hip extension         Hip abduction         Hip adduction         Hip internal rotation         Hip external rotation         Knee flexion   10 8 8 8 10   Knee extension 21 88 101 120 120 122 122  Ankle dorsiflexion         Ankle plantarflexion         Ankle inversion         Ankle eversion          (Blank rows = not tested)  LOWER EXTREMITY MMT: L 5/5  MMT Right eval Right 07/14/23 Right 07/18/23 Right 08/03/23  Hip flexion 3+     Hip extension 3+     Hip abduction 3+     Hip adduction      Hip internal  rotation      Hip external rotation      Knee flexion 3 4+ 4+ 5  Knee extension 3 4 4  4+  Ankle dorsiflexion 3+     Ankle plantarflexion      Ankle inversion      Ankle eversion       GAIT: Distance walked: In clinic distances Assistive device utilized: Walker - 2 wheeled Level of assistance: Modified independence Comments: Patient walks with step through gait pattern decreased R stance and decreased WB trough RLE   TODAY'S TREATMENT:  DATE:  08/24/23 Bike L3.0 x6 min R knee PROM wih end range holds  Patella mobs  Tibiofemoral mobs Leg press 50lb 2x10 Hs curls 25lb 2x10, RLE 15lb x10 Leg Ext 10lb 2x10, RLE 5lb x5 6in forward and lateral step ups x10 Airex on 6in box step ups 30lb resisted gait w/ 6in step up x5, then 20lb x5 Heel raises black bar 2x15 VASO, med press x 10 minutes for swelling and pain relief.  08/17/23 Bike L3.5 x 6 min Seated active knee flex to 127 Step down from 4" step to touch L heel to floor and back up x 10. Seated Knee press on ball for TKE, mildly tight. Seated knee ext with overpressure, heel elevated, 4 x 5 sec Repeated knee press on ball with imporved ext. Leg press, 50#, 3 x 10 BLE VASO, med press x 10 minutes for swelling and pain relief.  08/10/03 Bike L 3.5 x6 min 5x sit to stand 18.24 seconds  TUG 11.29 sec  BERG 46/56 Leg press 50lb 2x10  Vas R knee med pressure x 10 min  08/08/23 Bike L 3.5 x6 min Leg press 50lb 2x10 HS curls 25lb 2x10, RLE 15lb x10  RLE Leg Ext 5lb x10, x5 R knee PROM wih end range holds  Patella mobs  Tibiofemoral mobs Vas R knee med pressure x 10 min  08/03/23 Bike L3 x 6 min Resisted gait 30lb 4 way x 3 each 6in box on airex step ups x10 each Leg press 50lb 2x12  Vas R knee med pressure x 10 min  08/01/23 NuStep L5 x4 min Bike L3 x 4 min Lateral 8in step ups from airex x  10 each  Step ups airex on 6in box x10 each Heel raises 2x15 Leg press 50lb 2x12 R knee PROM wih end range holds  Patella mobs  Tibiofemoral mobs Vas R knee med pressure x 10 min  07/28/23 Bike L2 x6 min Step ups airex on 6in box x10 each Lateral 8in step ups from airex x 10 each  Heel raises 2x10 R knee PROM wih end range holds  Patella mobs  Tibiofemoral mobs Vas R knee med pressure x 10 min    PATIENT EDUCATION:  Education details: POC, review HEP Person educated: Patient Education method: Explanation Education comprehension: verbalized understanding  HOME EXERCISE PROGRAM:  [id number]  ASSESSMENT:  CLINICAL IMPRESSION: Patient is a 72 y.o. who was seen today for physical therapy treatment for PT S/P R TKR 06/13/23. Treatment focused on quad strength and control with the use of functional interventions. Ce needed for TKE with step ups. Pt has a difficult time with resisted gait step ups. Overall pt did well with good effort, she reports better mobility post session. Pt  will benefit from continued focus to maximize ROM and return to PLOF.  OBJECTIVE IMPAIRMENTS: Abnormal gait, decreased activity tolerance, decreased balance, decreased coordination, decreased endurance, difficulty walking, decreased ROM, decreased strength, increased muscle spasms, impaired flexibility, improper body mechanics, postural dysfunction, and pain.   ACTIVITY LIMITATIONS: lifting, bending, sitting, standing, squatting, stairs, transfers, bed mobility, and locomotion level  PARTICIPATION LIMITATIONS: meal prep, cleaning, laundry, driving, shopping, and community activity  PERSONAL FACTORS: Past/current experiences are also affecting patient's functional outcome.   REHAB POTENTIAL: Good  CLINICAL DECISION MAKING: Evolving/moderate complexity  EVALUATION COMPLEXITY: Moderate   GOALS: Goals reviewed with patient? Yes  SHORT TERM GOALS: Target date: 07/01/23 I with initial  HEP Baseline: Goal status: Met 06/29/23  LONG TERM GOALS: Target date: 09/08/23  I with final HEP Baseline:  Goal status: INITIAL  2.  Increase R knee AROM to 0-120 Baseline:  Goal status: Partly Met 08/10/23  Lacks extension   3.  Increase R knee strength to at least 4+/5 Baseline:  Goal status: Met 08/03/23  4.  Patient will be able to walk x at least 400' MI, with LRAD, on level and unlevel surfaces. Baseline:  Goal status: Met  07/28/23 slight limp at times  5.  Patient will ambulate up and down at least 12 steps using U rail and step over step, MI Baseline:  Goal status: 08/17/23-Difficulty with eccentric control of RLE on decent. Ongoing  6.  Patient will report initiation of return to golf with putting and practice swings at home, without pain or swelling. Baseline:  Goal status: Ongoing have not played 08/03/23   PLAN:  PT FREQUENCY: 2-3x/week  PT DURATION: 12 weeks  PLANNED INTERVENTIONS: Therapeutic exercises, Therapeutic activity, Neuromuscular re-education, Balance training, Gait training, Patient/Family education, Self Care, Joint mobilization, Stair training, Electrical stimulation, Cryotherapy, Moist heat, scar mobilization, Taping, Vasopneumatic device, Ionotophoresis 4mg /ml Dexamethasone, and Manual therapy  PLAN FOR NEXT SESSION: Progress strength and ROM activities  Debroah Baller, PTA 08/24/23 2:38 PM

## 2023-08-26 ENCOUNTER — Ambulatory Visit: Payer: Medicare Other | Admitting: Physical Therapy

## 2023-08-30 ENCOUNTER — Ambulatory Visit: Payer: Medicare Other | Admitting: Physical Therapy

## 2023-08-30 ENCOUNTER — Encounter: Payer: Self-pay | Admitting: Physical Therapy

## 2023-08-30 DIAGNOSIS — R262 Difficulty in walking, not elsewhere classified: Secondary | ICD-10-CM

## 2023-08-30 DIAGNOSIS — M6281 Muscle weakness (generalized): Secondary | ICD-10-CM

## 2023-08-30 DIAGNOSIS — R2681 Unsteadiness on feet: Secondary | ICD-10-CM

## 2023-08-30 DIAGNOSIS — R600 Localized edema: Secondary | ICD-10-CM

## 2023-08-30 DIAGNOSIS — M25661 Stiffness of right knee, not elsewhere classified: Secondary | ICD-10-CM

## 2023-08-30 NOTE — Therapy (Signed)
OUTPATIENT PHYSICAL THERAPY LOWER EXTREMITY TREATMENT     Patient Name: Melinda Lane MRN: [id number] DOB:08/04/1951, 72 y.o., female Today's Date: 08/30/2023  END OF SESSION:  PT End of Session - 08/30/23 1016     Visit Number 20    Date for PT Re-Evaluation 09/14/23    PT Start Time 1015    PT Stop Time 1100    PT Time Calculation (min) 45 min    Activity Tolerance Patient tolerated treatment well    Behavior During Therapy [facility] for tasks assessed/performed            Past Medical History:  Diagnosis Date   Absence of bladder continence 11/15/2014   Acquired hypothyroidism 04/12/2012   Adjustment disorder with anxiety 02/17/2013   patient states she has no known diagnosis of this, doesn't take medication for this, states her primary care doesn't even have it noted in their records   Arterial fibromuscular dysplasia (HCC) 08/25/2011   Arthralgia of both hands 11/22/2017   Benign renovascular hypertension 06/23/2011   Formatting of this note might be different from the original. right   Cephalalgia 11/15/2014   Chest pain 04/12/2012   Crohn's disease of ileum without complication (HCC) 03/30/2016   Elevated troponin    Esophageal dysphagia 01/13/2017   HTN (hypertension) 04/12/2012   Hyperlipemia 04/12/2012   Hyperlipidemia 04/12/2012   Hypothyroidism    IBS (irritable bowel syndrome)    Incontinence in female 11/12/2014   Left knee pain 10/14/2011   Parkinson's disease (HCC) 07/24/2019   Pes anserine bursitis 10/14/2011   Primary osteoarthritis involving multiple joints 11/25/2017   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(HCC)    RLS (restless legs syndrome) 10/10/2019   Thyroid nodule 04/12/2012   Tremor 10/10/2019   Past Surgical History:  Procedure Laterality Date   APPENDECTOMY     AUGMENTATION MAMMAPLASTY     saline - 2015   CHOLECYSTECTOMY     ECTOPIC PREGNANCY SURGERY     KNEE SURGERY     LEFT HEART CATH AND CORONARY ANGIOGRAPHY N/A 03/11/2021   Procedure: LEFT  HEART CATH AND CORONARY ANGIOGRAPHY;  Surgeon: Lennette Bihari, MD;  Location: [facility] INVASIVE CV LAB;  Service: Cardiovascular;  Laterality: N/A;   TONSILLECTOMY     Patient Active Problem List   Diagnosis Date Noted   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) 05/11/2021   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(HCC) 03/30/2021   IBS (irritable bowel syndrome) 03/12/2021   Elevated troponin    RLS (restless legs syndrome) 10/10/2019   Tremor 10/10/2019   Parkinson's disease (HCC) 07/24/2019   Primary osteoarthritis involving multiple joints 11/25/2017   Arthralgia of both hands 11/22/2017   Esophageal dysphagia 01/13/2017   Crohn's disease of ileum without complication (HCC) 03/30/2016   Absence of bladder continence 11/15/2014   Cephalalgia 11/15/2014   Incontinence in female 11/12/2014   Adjustment disorder with anxiety 02/17/2013   Chest pain 04/12/2012   HTN (hypertension) 04/12/2012   Hypothyroidism 04/12/2012   Hyperlipemia 04/12/2012   Thyroid nodule 04/12/2012   Hyperlipidemia 04/12/2012   Acquired hypothyroidism 04/12/2012   Left knee pain 10/14/2011   Pes anserine bursitis 10/14/2011   Arterial fibromuscular dysplasia (HCC) 08/25/2011   Benign renovascular hypertension 06/23/2011    PCP: Malka So., MD   REFERRING PROVIDER: Beverely Low, MD   REFERRING DIAG: [phone] (ICD-10-CM) - Other specified arthritis, right knee   THERAPY DIAG:  Difficulty in walking, not elsewhere classified  Muscle weakness (generalized)  Unsteadiness on feet  Stiffness of  right knee, not elsewhere classified  Localized edema  Rationale for Evaluation and Treatment: Rehabilitation  ONSET DATE: 06/13/23  SUBJECTIVE:   SUBJECTIVE STATEMENT: I see the MD this afternoon, c/o mostly stiffness in the mornings  PERTINENT HISTORY: PMHx: tremors, OA, HTN, R TKR 06/13/23 PAIN:  Are you having pain? Yes: NPRS scale: 2/10 Pain location: knee Pain description: Sharp,  aching Aggravating factors: Being still and then trying to move Relieving factors: ice, pain meds.  PRECAUTIONS: Knee  RED FLAGS: None   WEIGHT BEARING RESTRICTIONS: Yes WBAT  FALLS:  Has patient fallen in last 6 months? No  LIVING ENVIRONMENT: Lives with: lives with their family Lives in: House/apartment Stairs: Yes: Internal: 14 steps; on right going up and External: 1 steps; none Has following equipment at home: Dan Humphreys - 2 wheeled  OCCUPATION: Works in an office. Mostly seated.  PLOF: Independent  PATIENT GOALS: Patient would like to recover her ROM and strength. Get back to golf, swimming, diving.  NEXT MD VISIT: Next Thursday, 10/10  OBJECTIVE:  Note: Objective measures were completed at Evaluation unless otherwise noted.  DIAGNOSTIC FINDINGS: N/A  COGNITION: Overall cognitive status: Within functional limits for tasks assessed     SENSATION: WFL  EDEMA:  Normal edema noted.  POSTURE: slightly flexed over walker, decreased WB through RLE  PALPATION: R LE taught throughout foot to thigh, tender points where skin is drawn tight, improved with gentle retrograde massage.  INCISION: clean, dry, no redness noted. Waterproof dressing applied and ace wraps re-applied per patient request vs the TED hose.  LOWER EXTREMITY ROM: Grossly WNL except R knee  Active ROM Right eval Left eval R knee 06/29/23 R Knee 07/14/23 R knee 07/18/23 R knee 08/03/23 R knee 08/10/23 Right knee AROM 08/30/23  Hip flexion          Hip extension          Hip abduction          Hip adduction          Hip internal rotation          Hip external rotation          Knee flexion   10 8 8 8 10 5   Knee extension 21 88 101 120 120 122 122 122  Ankle dorsiflexion          Ankle plantarflexion          Ankle inversion          Ankle eversion           (Blank rows = not tested)  LOWER EXTREMITY MMT: L 5/5  MMT Right eval Right 07/14/23 Right 07/18/23 Right 08/03/23  Hip flexion 3+      Hip extension 3+     Hip abduction 3+     Hip adduction      Hip internal rotation      Hip external rotation      Knee flexion 3 4+ 4+ 5  Knee extension 3 4 4  4+  Ankle dorsiflexion 3+     Ankle plantarflexion      Ankle inversion      Ankle eversion       GAIT: Distance walked: In clinic distances Assistive device utilized: Walker - 2 wheeled Level of assistance: Modified independence Comments: Patient walks with step through gait pattern decreased R stance and decreased WB trough RLE   TODAY'S TREATMENT:  DATE:  08/30/23 Bike level 3 x 6 minutes Gait outside up and down slopes, then in grass, uneven terrain On minitramp march and small bounce with and without hands On Bosu upside down really struggled with this On rocker board two ways again struggles with this On airex ball toss, head turns, eyes closed SAQ 3# with focus on TKE Passive stretch focus on extension Vaso 34 degrees in elevation right knee  08/24/23 Bike L3.0 x6 min R knee PROM wih end range holds  Patella mobs  Tibiofemoral mobs Leg press 50lb 2x10 Hs curls 25lb 2x10, RLE 15lb x10 Leg Ext 10lb 2x10, RLE 5lb x5 6in forward and lateral step ups x10 Airex on 6in box step ups 30lb resisted gait w/ 6in step up x5, then 20lb x5 Heel raises black bar 2x15 VASO, med press x 10 minutes for swelling and pain relief.  08/17/23 Bike L3.5 x 6 min Seated active knee flex to 127 Step down from 4" step to touch L heel to floor and back up x 10. Seated Knee press on ball for TKE, mildly tight. Seated knee ext with overpressure, heel elevated, 4 x 5 sec Repeated knee press on ball with imporved ext. Leg press, 50#, 3 x 10 BLE VASO, med press x 10 minutes for swelling and pain relief.  08/10/03 Bike L 3.5 x6 min 5x sit to stand 18.24 seconds  TUG 11.29 sec  BERG 46/56 Leg press 50lb  2x10  Vas R knee med pressure x 10 min  08/08/23 Bike L 3.5 x6 min Leg press 50lb 2x10 HS curls 25lb 2x10, RLE 15lb x10  RLE Leg Ext 5lb x10, x5 R knee PROM wih end range holds  Patella mobs  Tibiofemoral mobs Vas R knee med pressure x 10 min  08/03/23 Bike L3 x 6 min Resisted gait 30lb 4 way x 3 each 6in box on airex step ups x10 each Leg press 50lb 2x12  Vas R knee med pressure x 10 min  08/01/23 NuStep L5 x4 min Bike L3 x 4 min Lateral 8in step ups from airex x 10 each  Step ups airex on 6in box x10 each Heel raises 2x15 Leg press 50lb 2x12 R knee PROM wih end range holds  Patella mobs  Tibiofemoral mobs Vas R knee med pressure x 10 min  PATIENT EDUCATION:  Education details: POC, review HEP Person educated: Patient Education method: Explanation Education comprehension: verbalized understanding  HOME EXERCISE PROGRAM:  [id number]  ASSESSMENT:  CLINICAL IMPRESSION: Patient is a 72 y.o. who was seen today for physical therapy treatment for PT S/P R TKR 06/13/23. Treatment focused on proprioception and balance, she really struggled with this, she also tends to have some knee flexion with walking bilaterally.   C/O stiffness in the AM,  really struggled with balance and proprioception.  Overall pt did well with good effort, she reports better mobility post session. Pt  will benefit from continued focus to maximize ROM and return to PLOF.  OBJECTIVE IMPAIRMENTS: Abnormal gait, decreased activity tolerance, decreased balance, decreased coordination, decreased endurance, difficulty walking, decreased ROM, decreased strength, increased muscle spasms, impaired flexibility, improper body mechanics, postural dysfunction, and pain.   ACTIVITY LIMITATIONS: lifting, bending, sitting, standing, squatting, stairs, transfers, bed mobility, and locomotion level  PARTICIPATION LIMITATIONS: meal prep, cleaning, laundry, driving, shopping, and community activity  PERSONAL FACTORS:  Past/current experiences are also affecting patient's functional outcome.   REHAB POTENTIAL: Good  CLINICAL DECISION MAKING: Evolving/moderate complexity  EVALUATION COMPLEXITY: Moderate  GOALS: Goals reviewed with patient? Yes  SHORT TERM GOALS: Target date: 07/01/23 I with initial HEP Baseline: Goal status: Met 06/29/23  LONG TERM GOALS: Target date: 09/08/23  I with final HEP Baseline:  Goal status: progressing 08/30/23  2.  Increase R knee AROM to 0-120 Baseline:  Goal status: Partly Met 08/10/23  Lacks extension   3.  Increase R knee strength to at least 4+/5 Baseline:  Goal status: Met 08/03/23  4.  Patient will be able to walk x at least 400' MI, with LRAD, on level and unlevel surfaces. Baseline:  Goal status: Met  07/28/23 slight limp at times  5.  Patient will ambulate up and down at least 12 steps using U rail and step over step, MI Baseline:  Goal status: 08/17/23-Difficulty with eccentric control of RLE on decent. Ongoing  6.  Patient will report initiation of return to golf with putting and practice swings at home, without pain or swelling. Baseline:  Goal status: Ongoing have not played 08/03/23   PLAN:  PT FREQUENCY: 2-3x/week  PT DURATION: 12 weeks  PLANNED INTERVENTIONS: Therapeutic exercises, Therapeutic activity, Neuromuscular re-education, Balance training, Gait training, Patient/Family education, Self Care, Joint mobilization, Stair training, Electrical stimulation, Cryotherapy, Moist heat, scar mobilization, Taping, Vasopneumatic device, Ionotophoresis 4mg /ml Dexamethasone, and Manual therapy  PLAN FOR NEXT SESSION:Patient doing well, some issues with TKE and proprioception, she has questions about trampoline park and with kneeling  Stacie Glaze, PT 08/30/23 10:17 AM

## 2023-09-01 ENCOUNTER — Encounter: Payer: Self-pay | Admitting: Physical Therapy

## 2023-09-01 ENCOUNTER — Ambulatory Visit: Payer: Medicare Other | Admitting: Physical Therapy

## 2023-09-01 DIAGNOSIS — R2681 Unsteadiness on feet: Secondary | ICD-10-CM

## 2023-09-01 DIAGNOSIS — R600 Localized edema: Secondary | ICD-10-CM

## 2023-09-01 DIAGNOSIS — M6281 Muscle weakness (generalized): Secondary | ICD-10-CM

## 2023-09-01 DIAGNOSIS — R262 Difficulty in walking, not elsewhere classified: Secondary | ICD-10-CM | POA: Diagnosis not present

## 2023-09-01 DIAGNOSIS — M25661 Stiffness of right knee, not elsewhere classified: Secondary | ICD-10-CM

## 2023-09-01 NOTE — Therapy (Signed)
OUTPATIENT PHYSICAL THERAPY LOWER EXTREMITY TREATMENT     Patient Name: Melinda Lane MRN: [id number] DOB:05-22-1951, 72 y.o., female Today's Date: 09/01/2023  END OF SESSION:  PT End of Session - 09/01/23 1536     Visit Number 21    Date for PT Re-Evaluation 09/14/23    PT Start Time 1528    PT Stop Time 1610    PT Time Calculation (min) 42 min    Activity Tolerance Patient tolerated treatment well    Behavior During Therapy [facility] for tasks assessed/performed             Past Medical History:  Diagnosis Date   Absence of bladder continence 11/15/2014   Acquired hypothyroidism 04/12/2012   Adjustment disorder with anxiety 02/17/2013   patient states she has no known diagnosis of this, doesn't take medication for this, states her primary care doesn't even have it noted in their records   Arterial fibromuscular dysplasia (HCC) 08/25/2011   Arthralgia of both hands 11/22/2017   Benign renovascular hypertension 06/23/2011   Formatting of this note might be different from the original. right   Cephalalgia 11/15/2014   Chest pain 04/12/2012   Crohn's disease of ileum without complication (HCC) 03/30/2016   Elevated troponin    Esophageal dysphagia 01/13/2017   HTN (hypertension) 04/12/2012   Hyperlipemia 04/12/2012   Hyperlipidemia 04/12/2012   Hypothyroidism    IBS (irritable bowel syndrome)    Incontinence in female 11/12/2014   Left knee pain 10/14/2011   Parkinson's disease (HCC) 07/24/2019   Pes anserine bursitis 10/14/2011   Primary osteoarthritis involving multiple joints 11/25/2017   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(HCC)    RLS (restless legs syndrome) 10/10/2019   Thyroid nodule 04/12/2012   Tremor 10/10/2019   Past Surgical History:  Procedure Laterality Date   APPENDECTOMY     AUGMENTATION MAMMAPLASTY     saline - 2015   CHOLECYSTECTOMY     ECTOPIC PREGNANCY SURGERY     KNEE SURGERY     LEFT HEART CATH AND CORONARY ANGIOGRAPHY N/A 03/11/2021   Procedure: LEFT  HEART CATH AND CORONARY ANGIOGRAPHY;  Surgeon: Lennette Bihari, MD;  Location: [facility] INVASIVE CV LAB;  Service: Cardiovascular;  Laterality: N/A;   TONSILLECTOMY     Patient Active Problem List   Diagnosis Date Noted   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) 05/11/2021   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(HCC) 03/30/2021   IBS (irritable bowel syndrome) 03/12/2021   Elevated troponin    RLS (restless legs syndrome) 10/10/2019   Tremor 10/10/2019   Parkinson's disease (HCC) 07/24/2019   Primary osteoarthritis involving multiple joints 11/25/2017   Arthralgia of both hands 11/22/2017   Esophageal dysphagia 01/13/2017   Crohn's disease of ileum without complication (HCC) 03/30/2016   Absence of bladder continence 11/15/2014   Cephalalgia 11/15/2014   Incontinence in female 11/12/2014   Adjustment disorder with anxiety 02/17/2013   Chest pain 04/12/2012   HTN (hypertension) 04/12/2012   Hypothyroidism 04/12/2012   Hyperlipemia 04/12/2012   Thyroid nodule 04/12/2012   Hyperlipidemia 04/12/2012   Acquired hypothyroidism 04/12/2012   Left knee pain 10/14/2011   Pes anserine bursitis 10/14/2011   Arterial fibromuscular dysplasia (HCC) 08/25/2011   Benign renovascular hypertension 06/23/2011    PCP: Malka So., MD   REFERRING PROVIDER: Beverely Low, MD   REFERRING DIAG: [id number] (ICD-10-CM) - Other specified arthritis, right knee   THERAPY DIAG:  Difficulty in walking, not elsewhere classified  Muscle weakness (generalized)  Unsteadiness on feet  Stiffness  of right knee, not elsewhere classified  Localized edema  Rationale for Evaluation and Treatment: Rehabilitation  ONSET DATE: 06/13/23  SUBJECTIVE:   SUBJECTIVE STATEMENT: Patient reports that the Dr recommended not getting on a trampoline. He said everything is looking good, almost completely straight.  PERTINENT HISTORY: PMHx: tremors, OA, HTN, R TKR 06/13/23 PAIN:  Are you having pain? Yes: NPRS scale:  2/10 Pain location: knee Pain description: Sharp, aching Aggravating factors: Being still and then trying to move Relieving factors: ice, pain meds.  PRECAUTIONS: Knee  RED FLAGS: None   WEIGHT BEARING RESTRICTIONS: Yes WBAT  FALLS:  Has patient fallen in last 6 months? No  LIVING ENVIRONMENT: Lives with: lives with their family Lives in: House/apartment Stairs: Yes: Internal: 14 steps; on right going up and External: 1 steps; none Has following equipment at home: Dan Humphreys - 2 wheeled  OCCUPATION: Works in an office. Mostly seated.  PLOF: Independent  PATIENT GOALS: Patient would like to recover her ROM and strength. Get back to golf, swimming, diving.  NEXT MD VISIT: Next Thursday, 10/10  OBJECTIVE:  Note: Objective measures were completed at Evaluation unless otherwise noted.  DIAGNOSTIC FINDINGS: N/A  COGNITION: Overall cognitive status: Within functional limits for tasks assessed     SENSATION: WFL  EDEMA:  Normal edema noted.  POSTURE: slightly flexed over walker, decreased WB through RLE  PALPATION: R LE taught throughout foot to thigh, tender points where skin is drawn tight, improved with gentle retrograde massage.  INCISION: clean, dry, no redness noted. Waterproof dressing applied and ace wraps re-applied per patient request vs the TED hose.  LOWER EXTREMITY ROM: Grossly WNL except R knee  Active ROM Right eval Left eval R knee 06/29/23 R Knee 07/14/23 R knee 07/18/23 R knee 08/03/23 R knee 08/10/23 Right knee AROM 08/30/23  Hip flexion          Hip extension          Hip abduction          Hip adduction          Hip internal rotation          Hip external rotation          Knee flexion   10 8 8 8 10 5   Knee extension 21 88 101 120 120 122 122 122  Ankle dorsiflexion          Ankle plantarflexion          Ankle inversion          Ankle eversion           (Blank rows = not tested)  LOWER EXTREMITY MMT: L 5/5  MMT Right eval  Right 07/14/23 Right 07/18/23 Right 08/03/23  Hip flexion 3+     Hip extension 3+     Hip abduction 3+     Hip adduction      Hip internal rotation      Hip external rotation      Knee flexion 3 4+ 4+ 5  Knee extension 3 4 4  4+  Ankle dorsiflexion 3+     Ankle plantarflexion      Ankle inversion      Ankle eversion       GAIT: Distance walked: In clinic distances Assistive device utilized: Walker - 2 wheeled Level of assistance: Modified independence Comments: Patient walks with step through gait pattern decreased R stance and decreased WB trough RLE   TODAY'S TREATMENT:  DATE:  09/01/23 Bike L3 x 6 minutes SLS, much difficulty, max 8 sec on R, 2 on L Eccentric heel raises on step. Minimize UE support, but had to maintain some UE support for balance. 10 reps each foot. B side stepping on airex plank x 4 each direction, no UE support. Standing on airex eyes open, no difficulty, eyes closed, increased sway, progress to performing weight shifts over the feet, lat and ant/post, required occ UE support for safety Marching on airex, emphasis on stability in stance limb, occ UE support needed, more difficult in LLE stance. Seated passive knee ext on mat, able to get flat without foot elevation BLE leg press, 50#, 25 reps Seated knee flex, R, 25#, 2 x 10 reps Seated knee ext, 5#, 2 x 10 reps VASO 10 min, med pressure for inflammation and pain relief  08/30/23 Bike level 3 x 6 minutes Gait outside up and down slopes, then in grass, uneven terrain On minitramp march and small bounce with and without hands On Bosu upside down really struggled with this On rocker board two ways again struggles with this On airex ball toss, head turns, eyes closed SAQ 3# with focus on TKE Passive stretch focus on extension Vaso 34 degrees in elevation right knee  08/24/23 Bike  L3.0 x6 min R knee PROM wih end range holds  Patella mobs  Tibiofemoral mobs Leg press 50lb 2x10 Hs curls 25lb 2x10, RLE 15lb x10 Leg Ext 10lb 2x10, RLE 5lb x5 6in forward and lateral step ups x10 Airex on 6in box step ups 30lb resisted gait w/ 6in step up x5, then 20lb x5 Heel raises black bar 2x15 VASO, med press x 10 minutes for swelling and pain relief.  08/17/23 Bike L3.5 x 6 min Seated active knee flex to 127 Step down from 4" step to touch L heel to floor and back up x 10. Seated Knee press on ball for TKE, mildly tight. Seated knee ext with overpressure, heel elevated, 4 x 5 sec Repeated knee press on ball with imporved ext. Leg press, 50#, 3 x 10 BLE VASO, med press x 10 minutes for swelling and pain relief.  08/10/03 Bike L 3.5 x6 min 5x sit to stand 18.24 seconds  TUG 11.29 sec  BERG 46/56 Leg press 50lb 2x10  Vas R knee med pressure x 10 min  08/08/23 Bike L 3.5 x6 min Leg press 50lb 2x10 HS curls 25lb 2x10, RLE 15lb x10  RLE Leg Ext 5lb x10, x5 R knee PROM wih end range holds  Patella mobs  Tibiofemoral mobs Vas R knee med pressure x 10 min  08/03/23 Bike L3 x 6 min Resisted gait 30lb 4 way x 3 each 6in box on airex step ups x10 each Leg press 50lb 2x12  Vas R knee med pressure x 10 min  08/01/23 NuStep L5 x4 min Bike L3 x 4 min Lateral 8in step ups from airex x 10 each  Step ups airex on 6in box x10 each Heel raises 2x15 Leg press 50lb 2x12 R knee PROM wih end range holds  Patella mobs  Tibiofemoral mobs Vas R knee med pressure x 10 min  PATIENT EDUCATION:  Education details: POC, review HEP Person educated: Patient Education method: Explanation Education comprehension: verbalized understanding  HOME EXERCISE PROGRAM:  [id number]  ASSESSMENT:  CLINICAL IMPRESSION: Patient is a 72 y.o. who was seen today for physical therapy treatment for PT S/P R TKR 06/13/23. Treatment Again had primary focus on balance and proprioception,  but we did  also continue with some strengthening and knee ext ROM. Her R leg has better balance than L. She does seem to use vision to facilitate her balance, as she had more difficulty with eyes closed. Educated to using night lights for safety. She would benefit from adding some speed of movement re-education.  OBJECTIVE IMPAIRMENTS: Abnormal gait, decreased activity tolerance, decreased balance, decreased coordination, decreased endurance, difficulty walking, decreased ROM, decreased strength, increased muscle spasms, impaired flexibility, improper body mechanics, postural dysfunction, and pain.   ACTIVITY LIMITATIONS: lifting, bending, sitting, standing, squatting, stairs, transfers, bed mobility, and locomotion level  PARTICIPATION LIMITATIONS: meal prep, cleaning, laundry, driving, shopping, and community activity  PERSONAL FACTORS: Past/current experiences are also affecting patient's functional outcome.   REHAB POTENTIAL: Good  CLINICAL DECISION MAKING: Evolving/moderate complexity  EVALUATION COMPLEXITY: Moderate   GOALS: Goals reviewed with patient? Yes  SHORT TERM GOALS: Target date: 07/01/23 I with initial HEP Baseline: Goal status: Met 06/29/23  LONG TERM GOALS: Target date: 09/08/23  I with final HEP Baseline:  Goal status: progressing 08/30/23  2.  Increase R knee AROM to 0-120 Baseline:  Goal status: Partly Met 08/10/23  Lacks extension   3.  Increase R knee strength to at least 4+/5 Baseline:  Goal status: Met 08/03/23  4.  Patient will be able to walk x at least 400' MI, with LRAD, on level and unlevel surfaces. Baseline:  Goal status: Met  07/28/23 slight limp at times  5.  Patient will ambulate up and down at least 12 steps using U rail and step over step, MI Baseline:  Goal status: 08/17/23-Difficulty with eccentric control of RLE on decent. Ongoing  6.  Patient will report initiation of return to golf with putting and practice swings at home, without pain or  swelling. Baseline:  Goal status: Ongoing have not played 08/03/23   PLAN:  PT FREQUENCY: 2-3x/week  PT DURATION: 12 weeks  PLANNED INTERVENTIONS: Therapeutic exercises, Therapeutic activity, Neuromuscular re-education, Balance training, Gait training, Patient/Family education, Self Care, Joint mobilization, Stair training, Electrical stimulation, Cryotherapy, Moist heat, scar mobilization, Taping, Vasopneumatic device, Ionotophoresis 4mg /ml Dexamethasone, and Manual therapy  PLAN FOR NEXT SESSION:Patient doing well, some issues with TKE and proprioception, she has questions about trampoline park and with kneeling  Oley Balm DPT 09/01/23 4:13 PM

## 2023-09-09 ENCOUNTER — Ambulatory Visit: Payer: Medicare Other | Admitting: Physical Therapy

## 2023-09-09 ENCOUNTER — Encounter: Payer: Self-pay | Admitting: Physical Therapy

## 2023-09-09 DIAGNOSIS — R2681 Unsteadiness on feet: Secondary | ICD-10-CM

## 2023-09-09 DIAGNOSIS — R262 Difficulty in walking, not elsewhere classified: Secondary | ICD-10-CM | POA: Diagnosis not present

## 2023-09-09 DIAGNOSIS — R600 Localized edema: Secondary | ICD-10-CM

## 2023-09-09 DIAGNOSIS — M6281 Muscle weakness (generalized): Secondary | ICD-10-CM

## 2023-09-09 DIAGNOSIS — M25661 Stiffness of right knee, not elsewhere classified: Secondary | ICD-10-CM

## 2023-09-09 NOTE — Therapy (Signed)
OUTPATIENT PHYSICAL THERAPY LOWER EXTREMITY TREATMENT     Patient Name: Melinda Lane MRN: [id number] DOB:[birth year], 72 y.o., female Today's Date: 09/09/2023  END OF SESSION:  PT End of Session - 09/09/23 1059     Visit Number 22    Date for PT Re-Evaluation 09/14/23    PT Start Time 1100    PT Stop Time 1145    PT Time Calculation (min) 45 min    Activity Tolerance Patient tolerated treatment well    Behavior During Therapy [facility] for tasks assessed/performed             Past Medical History:  Diagnosis Date   Absence of bladder continence 11/15/2014   Acquired hypothyroidism 04/12/2012   Adjustment disorder with anxiety 02/17/2013   patient states she has no known diagnosis of this, doesn't take medication for this, states her primary care doesn't even have it noted in their records   Arterial fibromuscular dysplasia (HCC) 08/25/2011   Arthralgia of both hands 11/22/2017   Benign renovascular hypertension 06/23/2011   Formatting of this note might be different from the original. right   Cephalalgia 11/15/2014   Chest pain 04/12/2012   Crohn's disease of ileum without complication (HCC) 03/30/2016   Elevated troponin    Esophageal dysphagia 01/13/2017   HTN (hypertension) 04/12/2012   Hyperlipemia 04/12/2012   Hyperlipidemia 04/12/2012   Hypothyroidism    IBS (irritable bowel syndrome)    Incontinence in female 11/12/2014   Left knee pain 10/14/2011   Parkinson's disease (HCC) 07/24/2019   Pes anserine bursitis 10/14/2011   Primary osteoarthritis involving multiple joints 11/25/2017   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(HCC)    RLS (restless legs syndrome) 10/10/2019   Thyroid nodule 04/12/2012   Tremor 10/10/2019   Past Surgical History:  Procedure Laterality Date   APPENDECTOMY     AUGMENTATION MAMMAPLASTY     saline - 2015   CHOLECYSTECTOMY     ECTOPIC PREGNANCY SURGERY     KNEE SURGERY     LEFT HEART CATH AND CORONARY ANGIOGRAPHY N/A 03/11/2021   Procedure: LEFT  HEART CATH AND CORONARY ANGIOGRAPHY;  Surgeon: Lennette Bihari, MD;  Location: [facility];  Service: Cardiovascular;  Laterality: N/A;   TONSILLECTOMY     Patient Active Problem List   Diagnosis Date Noted   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) 05/11/2021   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(HCC) 03/30/2021   IBS (irritable bowel syndrome) 03/12/2021   Elevated troponin    RLS (restless legs syndrome) 10/10/2019   Tremor 10/10/2019   Parkinson's disease (HCC) 07/24/2019   Primary osteoarthritis involving multiple joints 11/25/2017   Arthralgia of both hands 11/22/2017   Esophageal dysphagia 01/13/2017   Crohn's disease of ileum without complication (HCC) 03/30/2016   Absence of bladder continence 11/15/2014   Cephalalgia 11/15/2014   Incontinence in female 11/12/2014   Adjustment disorder with anxiety 02/17/2013   Chest pain 04/12/2012   HTN (hypertension) 04/12/2012   Hypothyroidism 04/12/2012   Hyperlipemia 04/12/2012   Thyroid nodule 04/12/2012   Hyperlipidemia 04/12/2012   Acquired hypothyroidism 04/12/2012   Left knee pain 10/14/2011   Pes anserine bursitis 10/14/2011   Arterial fibromuscular dysplasia (HCC) 08/25/2011   Benign renovascular hypertension 06/23/2011    PCP: Malka So., MD   REFERRING PROVIDER: Beverely Low, MD   REFERRING DIAG: [phone] (ICD-10-CM) - Other specified arthritis, right knee   THERAPY DIAG:  Difficulty in walking, not elsewhere classified  Muscle weakness (generalized)  Unsteadiness on feet  Stiffness  of right knee, not elsewhere classified  Localized edema  Rationale for Evaluation and Treatment: Rehabilitation  ONSET DATE: 06/13/23  SUBJECTIVE:   SUBJECTIVE STATEMENT: Leg is a little stiff.  Been up on it a lot   PERTINENT HISTORY: PMHx: tremors, OA, HTN, R TKR 06/13/23 PAIN:  Are you having pain? Yes: NPRS scale: 2/10 Pain location: knee Pain description: Sharp, aching Aggravating factors: Being  still and then trying to move Relieving factors: ice, pain meds.  PRECAUTIONS: Knee  RED FLAGS: None   WEIGHT BEARING RESTRICTIONS: Yes WBAT  FALLS:  Has patient fallen in last 6 months? No  LIVING ENVIRONMENT: Lives with: lives with their family Lives in: House/apartment Stairs: Yes: Internal: 14 steps; on right going up and External: 1 steps; none Has following equipment at home: Dan Humphreys - 2 wheeled  OCCUPATION: Works in an office. Mostly seated.  PLOF: Independent  PATIENT GOALS: Patient would like to recover her ROM and strength. Get back to golf, swimming, diving.  NEXT MD VISIT: Next Thursday, 10/10  OBJECTIVE:  Note: Objective measures were completed at Evaluation unless otherwise noted.  DIAGNOSTIC FINDINGS: N/A  COGNITION: Overall cognitive status: Within functional limits for tasks assessed     SENSATION: WFL  EDEMA:  Normal edema noted.  POSTURE: slightly flexed over walker, decreased WB through RLE  PALPATION: R LE taught throughout foot to thigh, tender points where skin is drawn tight, improved with gentle retrograde massage.  INCISION: clean, dry, no redness noted. Waterproof dressing applied and ace wraps re-applied per patient request vs the TED hose.  LOWER EXTREMITY ROM: Grossly WNL except R knee  Active ROM Right eval Left eval R knee 06/29/23 R Knee 07/14/23 R knee 07/18/23 R knee 08/03/23 R knee 08/10/23 Right knee AROM 08/30/23 R knee AROM 09/09/23  Hip flexion           Hip extension           Hip abduction           Hip adduction           Hip internal rotation           Hip external rotation           Knee flexion   10 8 8 8 10 5 4   Knee extension 21 88 101 120 120 122 122 122 124  Ankle dorsiflexion           Ankle plantarflexion           Ankle inversion           Ankle eversion            (Blank rows = not tested)  LOWER EXTREMITY MMT: L 5/5  MMT Right eval Right 07/14/23 Right 07/18/23 Right 08/03/23  Hip flexion 3+      Hip extension 3+     Hip abduction 3+     Hip adduction      Hip internal rotation      Hip external rotation      Knee flexion 3 4+ 4+ 5  Knee extension 3 4 4  4+  Ankle dorsiflexion 3+     Ankle plantarflexion      Ankle inversion      Ankle eversion       GAIT: Distance walked: In clinic distances Assistive device utilized: Walker - 2 wheeled Level of assistance: Modified independence Comments: Patient walks with step through gait pattern decreased R stance and decreased WB trough RLE  TODAY'S TREATMENT:                                                                                                                              DATE:  09/09/23 Bike L3 x 6 minutes R knee PROM wih end range holds  Patella mobs  Tibiofemoral mobs Sit to stand on airex holding yellow ball 2x10 8in stpe ups x 10 each Resisted gait 40lb x 3 each Resisted side step 30lb x3 each HS curle 20lb RLE 2x10 RLE Leg Ext 5lb 2x10  09/01/23 Bike L3 x 6 minutes SLS, much difficulty, max 8 sec on R, 2 on L Eccentric heel raises on step. Minimize UE support, but had to maintain some UE support for balance. 10 reps each foot. B side stepping on airex plank x 4 each direction, no UE support. Standing on airex eyes open, no difficulty, eyes closed, increased sway, progress to performing weight shifts over the feet, lat and ant/post, required occ UE support for safety Marching on airex, emphasis on stability in stance limb, occ UE support needed, more difficult in LLE stance. Seated passive knee ext on mat, able to get flat without foot elevation BLE leg press, 50#, 25 reps Seated knee flex, R, 25#, 2 x 10 reps Seated knee ext, 5#, 2 x 10 reps VASO 10 min, med pressure for inflammation and pain relief  08/30/23 Bike level 3 x 6 minutes Gait outside up and down slopes, then in grass, uneven terrain On minitramp march and small bounce with and without hands On Bosu upside down really struggled with  this On rocker board two ways again struggles with this On airex ball toss, head turns, eyes closed SAQ 3# with focus on TKE Passive stretch focus on extension Vaso 34 degrees in elevation right knee  08/24/23 Bike L3.0 x6 min R knee PROM wih end range holds  Patella mobs  Tibiofemoral mobs Leg press 50lb 2x10 Hs curls 25lb 2x10, RLE 15lb x10 Leg Ext 10lb 2x10, RLE 5lb x5 6in forward and lateral step ups x10 Airex on 6in box step ups 30lb resisted gait w/ 6in step up x5, then 20lb x5 Heel raises black bar 2x15 VASO, med press x 10 minutes for swelling and pain relief.  08/17/23 Bike L3.5 x 6 min Seated active knee flex to 127 Step down from 4" step to touch L heel to floor and back up x 10. Seated Knee press on ball for TKE, mildly tight. Seated knee ext with overpressure, heel elevated, 4 x 5 sec Repeated knee press on ball with imporved ext. Leg press, 50#, 3 x 10 BLE VASO, med press x 10 minutes for swelling and pain relief.  08/10/03 Bike L 3.5 x6 min 5x sit to stand 18.24 seconds  TUG 11.29 sec  BERG 46/56 Leg press 50lb 2x10  Vas R knee med pressure x 10 min  08/08/23 Bike L 3.5 x6 min Leg press 50lb 2x10 HS curls  25lb 2x10, RLE 15lb x10  RLE Leg Ext 5lb x10, x5 R knee PROM wih end range holds  Patella mobs  Tibiofemoral mobs Vas R knee med pressure x 10 min  08/03/23 Bike L3 x 6 min Resisted gait 30lb 4 way x 3 each 6in box on airex step ups x10 each Leg press 50lb 2x12  Vas R knee med pressure x 10 min  08/01/23 NuStep L5 x4 min Bike L3 x 4 min Lateral 8in step ups from airex x 10 each  Step ups airex on 6in box x10 each Heel raises 2x15 Leg press 50lb 2x12 R knee PROM wih end range holds  Patella mobs  Tibiofemoral mobs Vas R knee med pressure x 10 min  PATIENT EDUCATION:  Education details: POC, review HEP Person educated: Patient Education method: Explanation Education comprehension: verbalized understanding  HOME EXERCISE PROGRAM:   [id number]  ASSESSMENT:  CLINICAL IMPRESSION: Patient is a 72 y.o. who was seen today for physical therapy treatment for PT S/P R TKR 06/13/23. Treatment focused on functional balance and strength. She has good overall ROM but is lacking a few degrees of knee extension. Pt had a difficult time with resisted sides steps. Cue for equal step length needed with resisted gait. She would benefit from adding some speed of movement re-education.  OBJECTIVE IMPAIRMENTS: Abnormal gait, decreased activity tolerance, decreased balance, decreased coordination, decreased endurance, difficulty walking, decreased ROM, decreased strength, increased muscle spasms, impaired flexibility, improper body mechanics, postural dysfunction, and pain.   ACTIVITY LIMITATIONS: lifting, bending, sitting, standing, squatting, stairs, transfers, bed mobility, and locomotion level  PARTICIPATION LIMITATIONS: meal prep, cleaning, laundry, driving, shopping, and community activity  PERSONAL FACTORS: Past/current experiences are also affecting patient's functional outcome.   REHAB POTENTIAL: Good  CLINICAL DECISION MAKING: Evolving/moderate complexity  EVALUATION COMPLEXITY: Moderate   GOALS: Goals reviewed with patient? Yes  SHORT TERM GOALS: Target date: 07/01/23 I with initial HEP Baseline: Goal status: Met 06/29/23  LONG TERM GOALS: Target date: 09/08/23  I with final HEP Baseline:  Goal status: progressing 08/30/23  2.  Increase R knee AROM to 0-120 Baseline:  Goal status: Partly Met 08/10/23  Lacks extension   3.  Increase R knee strength to at least 4+/5 Baseline:  Goal status: Met 08/03/23  4.  Patient will be able to walk x at least 400' MI, with LRAD, on level and unlevel surfaces. Baseline:  Goal status: Met  07/28/23 slight limp at times  5.  Patient will ambulate up and down at least 12 steps using U rail and step over step, MI Baseline:  Goal status: 08/17/23-Difficulty with eccentric control  of RLE on decent. Ongoing  6.  Patient will report initiation of return to golf with putting and practice swings at home, without pain or swelling. Baseline:  Goal status: Ongoing have not played 08/03/23   PLAN:  PT FREQUENCY: 2-3x/week  PT DURATION: 12 weeks  PLANNED INTERVENTIONS: Therapeutic exercises, Therapeutic activity, Neuromuscular re-education, Balance training, Gait training, Patient/Family education, Self Care, Joint mobilization, Stair training, Electrical stimulation, Cryotherapy, Moist heat, scar mobilization, Taping, Vasopneumatic device, Ionotophoresis 4mg /ml Dexamethasone, and Manual therapy  PLAN FOR NEXT SESSION:Patient doing well, some issues with TKE and proprioception, she has questions about trampoline park and with kneeling  Oley Balm DPT 09/09/23 11:00 AM

## 2023-09-12 ENCOUNTER — Ambulatory Visit: Payer: Medicare Other | Admitting: Physical Therapy

## 2023-09-13 ENCOUNTER — Encounter: Payer: Self-pay | Admitting: Physical Therapy

## 2023-09-13 ENCOUNTER — Ambulatory Visit: Payer: Medicare Other | Admitting: Physical Therapy

## 2023-09-13 DIAGNOSIS — R262 Difficulty in walking, not elsewhere classified: Secondary | ICD-10-CM | POA: Diagnosis not present

## 2023-09-13 DIAGNOSIS — R600 Localized edema: Secondary | ICD-10-CM

## 2023-09-13 DIAGNOSIS — R2681 Unsteadiness on feet: Secondary | ICD-10-CM

## 2023-09-13 DIAGNOSIS — M25661 Stiffness of right knee, not elsewhere classified: Secondary | ICD-10-CM

## 2023-09-13 DIAGNOSIS — M6281 Muscle weakness (generalized): Secondary | ICD-10-CM

## 2023-09-13 NOTE — Therapy (Signed)
 OUTPATIENT PHYSICAL THERAPY LOWER EXTREMITY TREATMENT     Patient Name: Melinda Lane MRN: [id number] DOB:Jun 24, 1951, 72 y.o., female Today's Date: 09/13/2023  END OF SESSION:  PT End of Session - 09/13/23 1600     Visit Number 23    Date for PT Re-Evaluation 09/14/23    PT Start Time 1600    PT Stop Time 1645    PT Time Calculation (min) 45 min    Activity Tolerance Patient tolerated treatment well    Behavior During Therapy [facility] for tasks assessed/performed             Past Medical History:  Diagnosis Date   Absence of bladder continence 11/15/2014   Acquired hypothyroidism 04/12/2012   Adjustment disorder with anxiety 02/17/2013   patient states she has no known diagnosis of this, doesn't take medication for this, states her primary care doesn't even have it noted in their records   Arterial fibromuscular dysplasia (HCC) 08/25/2011   Arthralgia of both hands 11/22/2017   Benign renovascular hypertension 06/23/2011   Formatting of this note might be different from the original. right   Cephalalgia 11/15/2014   Chest pain 04/12/2012   Crohn's disease of ileum without complication (HCC) 03/30/2016   Elevated troponin    Esophageal dysphagia 01/13/2017   HTN (hypertension) 04/12/2012   Hyperlipemia 04/12/2012   Hyperlipidemia 04/12/2012   Hypothyroidism    IBS (irritable bowel syndrome)    Incontinence in female 11/12/2014   Left knee pain 10/14/2011   Parkinson's disease (HCC) 07/24/2019   Pes anserine bursitis 10/14/2011   Primary osteoarthritis involving multiple joints 11/25/2017   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(HCC)    RLS (restless legs syndrome) 10/10/2019   Thyroid  nodule 04/12/2012   Tremor 10/10/2019   Past Surgical History:  Procedure Laterality Date   APPENDECTOMY     AUGMENTATION MAMMAPLASTY     saline - 2015   CHOLECYSTECTOMY     ECTOPIC PREGNANCY SURGERY     KNEE SURGERY     LEFT HEART CATH AND CORONARY ANGIOGRAPHY N/A 03/11/2021   Procedure: LEFT  HEART CATH AND CORONARY ANGIOGRAPHY;  Surgeon: Burnard Debby LABOR, MD;  Location: [facility];  Service: Cardiovascular;  Laterality: N/A;   TONSILLECTOMY     Patient Active Problem List   Diagnosis Date Noted   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) 05/11/2021   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(HCC) 03/30/2021   IBS (irritable bowel syndrome) 03/12/2021   Elevated troponin    RLS (restless legs syndrome) 10/10/2019   Tremor 10/10/2019   Parkinson's disease (HCC) 07/24/2019   Primary osteoarthritis involving multiple joints 11/25/2017   Arthralgia of both hands 11/22/2017   Esophageal dysphagia 01/13/2017   Crohn's disease of ileum without complication (HCC) 03/30/2016   Absence of bladder continence 11/15/2014   Cephalalgia 11/15/2014   Incontinence in female 11/12/2014   Adjustment disorder with anxiety 02/17/2013   Chest pain 04/12/2012   HTN (hypertension) 04/12/2012   Hypothyroidism 04/12/2012   Hyperlipemia 04/12/2012   Thyroid  nodule 04/12/2012   Hyperlipidemia 04/12/2012   Acquired hypothyroidism 04/12/2012   Left knee pain 10/14/2011   Pes anserine bursitis 10/14/2011   Arterial fibromuscular dysplasia (HCC) 08/25/2011   Benign renovascular hypertension 06/23/2011    PCP: Knox Toribio NOVAK., MD   REFERRING PROVIDER: Kay Kemps, MD   REFERRING DIAG: [phone] (ICD-10-CM) - Other specified arthritis, right knee   THERAPY DIAG:  Difficulty in walking, not elsewhere classified  Muscle weakness (generalized)  Unsteadiness on feet  Stiffness  of right knee, not elsewhere classified  Localized edema  Rationale for Evaluation and Treatment: Rehabilitation  ONSET DATE: 06/13/23  SUBJECTIVE:   SUBJECTIVE STATEMENT: Im good Had some pain in the knee last night, think it is due to being on the bike too much  PERTINENT HISTORY: PMHx: tremors, OA, HTN, R TKR 06/13/23 PAIN:  Are you having pain? Yes: NPRS scale: 1/10 Pain location: knee Pain  description: Sharp, aching Aggravating factors: Being still and then trying to move Relieving factors: ice, pain meds.  PRECAUTIONS: Knee  RED FLAGS: None   WEIGHT BEARING RESTRICTIONS: Yes WBAT  FALLS:  Has patient fallen in last 6 months? No  LIVING ENVIRONMENT: Lives with: lives with their family Lives in: House/apartment Stairs: Yes: Internal: 14 steps; on right going up and External: 1 steps; none Has following equipment at home: Vannie - 2 wheeled  OCCUPATION: Works in an office. Mostly seated.  PLOF: Independent  PATIENT GOALS: Patient would like to recover her ROM and strength. Get back to golf, swimming, diving.  NEXT MD VISIT: Next Thursday, 10/10  OBJECTIVE:  Note: Objective measures were completed at Evaluation unless otherwise noted.  DIAGNOSTIC FINDINGS: N/A  COGNITION: Overall cognitive status: Within functional limits for tasks assessed     SENSATION: WFL  EDEMA:  Normal edema noted.  POSTURE: slightly flexed over walker, decreased WB through RLE  PALPATION: R LE taught throughout foot to thigh, tender points where skin is drawn tight, improved with gentle retrograde massage.  INCISION: clean, dry, no redness noted. Waterproof dressing applied and ace wraps re-applied per patient request vs the TED hose.  LOWER EXTREMITY ROM: Grossly WNL except R knee  Active ROM Right eval Left eval R knee 06/29/23 R Knee 07/14/23 R knee 07/18/23 R knee 08/03/23 R knee 08/10/23 Right knee AROM 08/30/23 R knee AROM 09/09/23  Hip flexion           Hip extension           Hip abduction           Hip adduction           Hip internal rotation           Hip external rotation           Knee flexion   10 8 8 8 10 5 4   Knee extension 21 88 101 120 120 122 122 122 124  Ankle dorsiflexion           Ankle plantarflexion           Ankle inversion           Ankle eversion            (Blank rows = not tested)  LOWER EXTREMITY MMT: L 5/5  MMT Right eval  Right 07/14/23 Right 07/18/23 Right 08/03/23  Hip flexion 3+     Hip extension 3+     Hip abduction 3+     Hip adduction      Hip internal rotation      Hip external rotation      Knee flexion 3 4+ 4+ 5  Knee extension 3 4 4  4+  Ankle dorsiflexion 3+     Ankle plantarflexion      Ankle inversion      Ankle eversion       GAIT: Distance walked: In clinic distances Assistive device utilized: Walker - 2 wheeled Level of assistance: Modified independence Comments: Patient walks with step through gait pattern  decreased R stance and decreased WB trough RLE   TODAY'S TREATMENT:                                                                                                                              DATE:  09/13/23 Bike L3 x 6 minutes Leg press 40lb 2x10 Hs curls 35lb x10, RLE 15lb 2x10 Leg Ext 15lb x10, RLE 5lb 2x10 S2S OHP w/ yellow ball on airex 2x10 09/09/23 Bike L3 x 6 minutes R knee PROM wih end range holds  Patella mobs  Tibiofemoral mobs Sit to stand on airex holding yellow ball 2x10 8in stpe ups x 10 each Resisted gait 40lb x 3 each Resisted side step 30lb x3 each HS curle 20lb RLE 2x10 RLE Leg Ext 5lb 2x10  09/01/23 Bike L3 x 6 minutes SLS, much difficulty, max 8 sec on R, 2 on L Eccentric heel raises on step. Minimize UE support, but had to maintain some UE support for balance. 10 reps each foot. B side stepping on airex plank x 4 each direction, no UE support. Standing on airex eyes open, no difficulty, eyes closed, increased sway, progress to performing weight shifts over the feet, lat and ant/post, required occ UE support for safety Marching on airex, emphasis on stability in stance limb, occ UE support needed, more difficult in LLE stance. Seated passive knee ext on mat, able to get flat without foot elevation BLE leg press, 50#, 25 reps Seated knee flex, R, 25#, 2 x 10 reps Seated knee ext, 5#, 2 x 10 reps VASO 10 min, med pressure for inflammation and  pain relief  08/30/23 Bike level 3 x 6 minutes Gait outside up and down slopes, then in grass, uneven terrain On minitramp march and small bounce with and without hands On Bosu upside down really struggled with this On rocker board two ways again struggles with this On airex ball toss, head turns, eyes closed SAQ 3# with focus on TKE Passive stretch focus on extension Vaso 34 degrees in elevation right knee  08/24/23 Bike L3.0 x6 min R knee PROM wih end range holds  Patella mobs  Tibiofemoral mobs Leg press 50lb 2x10 Hs curls 25lb 2x10, RLE 15lb x10 Leg Ext 10lb 2x10, RLE 5lb x5 6in forward and lateral step ups x10 Airex on 6in box step ups 30lb resisted gait w/ 6in step up x5, then 20lb x5 Heel raises black bar 2x15 VASO, med press x 10 minutes for swelling and pain relief.  08/17/23 Bike L3.5 x 6 min Seated active knee flex to 127 Step down from 4 step to touch L heel to floor and back up x 10. Seated Knee press on ball for TKE, mildly tight. Seated knee ext with overpressure, heel elevated, 4 x 5 sec Repeated knee press on ball with imporved ext. Leg press, 50#, 3 x 10 BLE VASO, med press x 10 minutes for swelling and pain relief.  08/10/03 Bike L  3.5 x6 min 5x sit to stand 18.24 seconds  TUG 11.29 sec  BERG 46/56 Leg press 50lb 2x10  Vas R knee med pressure x 10 min  08/08/23 Bike L 3.5 x6 min Leg press 50lb 2x10 HS curls 25lb 2x10, RLE 15lb x10  RLE Leg Ext 5lb x10, x5 R knee PROM wih end range holds  Patella mobs  Tibiofemoral mobs Vas R knee med pressure x 10 min  08/03/23 Bike L3 x 6 min Resisted gait 30lb 4 way x 3 each 6in box on airex step ups x10 each Leg press 50lb 2x12  Vas R knee med pressure x 10 min  08/01/23 NuStep L5 x4 min Bike L3 x 4 min Lateral 8in step ups from airex x 10 each  Step ups airex on 6in box x10 each Heel raises 2x15 Leg press 50lb 2x12 R knee PROM wih end range holds  Patella mobs  Tibiofemoral mobs Vas R knee  med pressure x 10 min  PATIENT EDUCATION:  Education details: POC, review HEP Person educated: Patient Education method: Explanation Education comprehension: verbalized understanding  HOME EXERCISE PROGRAM:  [id number]  ASSESSMENT:  CLINICAL IMPRESSION: Patient is a 72 y.o. who was seen today for physical therapy treatment for PT S/P R TKR 06/13/23. Pt has progressed meeting most of her goals. The only goal that remains is her playing golf and she has not tried due to the time of year. Pt reports that she does feel like she could go to the driving range. No issue with today's interventions.  OBJECTIVE IMPAIRMENTS: Abnormal gait, decreased activity tolerance, decreased balance, decreased coordination, decreased endurance, difficulty walking, decreased ROM, decreased strength, increased muscle spasms, impaired flexibility, improper body mechanics, postural dysfunction, and pain.   ACTIVITY LIMITATIONS: lifting, bending, sitting, standing, squatting, stairs, transfers, bed mobility, and locomotion level  PARTICIPATION LIMITATIONS: meal prep, cleaning, laundry, driving, shopping, and community activity  PERSONAL FACTORS: Past/current experiences are also affecting patient's functional outcome.   REHAB POTENTIAL: Good  CLINICAL DECISION MAKING: Evolving/moderate complexity  EVALUATION COMPLEXITY: Moderate   GOALS: Goals reviewed with patient? Yes  SHORT TERM GOALS: Target date: 07/01/23 I with initial HEP Baseline: Goal status: Met 06/29/23  LONG TERM GOALS: Target date: 09/08/23  I with final HEP Baseline:  Goal status: progressing 08/30/23  2.  Increase R knee AROM to 0-120 Baseline:  Goal status: Partly Met 08/10/23  Lacks extension   3.  Increase R knee strength to at least 4+/5 Baseline:  Goal status: Met 08/03/23  4.  Patient will be able to walk x at least 400' MI, with LRAD, on level and unlevel surfaces. Baseline:  Goal status: Met  07/28/23 slight limp at  times  5.  Patient will ambulate up and down at least 12 steps using U rail and step over step, MI Baseline:  Goal status: 09/13/23 Met  6.  Patient will report initiation of return to golf with putting and practice swings at home, without pain or swelling. Baseline:  Goal status: Ongoing has not tried 09/13/23   PLAN:  PT FREQUENCY: 2-3x/week  PT DURATION: 12 weeks  PLANNED INTERVENTIONS: Therapeutic exercises, Therapeutic activity, Neuromuscular re-education, Balance training, Gait training, Patient/Family education, Self Care, Joint mobilization, Stair training, Electrical stimulation, Cryotherapy, Moist heat, scar mobilization, Taping, Vasopneumatic device, Ionotophoresis 4mg /ml Dexamethasone, and Manual therapy  PLAN FOR NEXT SESSION: D/C PT  PHYSICAL THERAPY DISCHARGE SUMMARY  Visits from Start of Care: 23 Patient agrees to discharge. Patient goals were met. Patient is being  discharged due to meeting the stated rehab goals.   Tanda Sorrow, PTA 09/13/23 4:01 PM

## 2023-11-03 ENCOUNTER — Telehealth: Payer: Self-pay | Admitting: Neurology

## 2023-11-03 NOTE — Telephone Encounter (Signed)
 Pt reporting that the strength of carbidopa-levodopa (SINEMET IR) 25-100 MG tablet  is not strong enough, she'd like a call about the strength being increased

## 2023-11-04 NOTE — Telephone Encounter (Signed)
 I called pt.  She is having more of a rough time hand trembling, legs stiffening, taking quick steps. Cannot write.  Last seen 07/2023.  Taking 25/100 sinemet before meals and bedtime. Mirapex 0.25mg  at at bedtime. Asking if to  increase. Thyroid was checked and ok per pt.

## 2023-11-04 NOTE — Telephone Encounter (Signed)
 I don't see a FU appt pending. Pls offer appt with me or one of our NPs to discuss medication management.

## 2023-11-07 NOTE — Telephone Encounter (Signed)
 I spoke with the patient and got her scheduled with Dr Frances Furbish for this Thurs 2/27 at 8:45 am. Pt was appreciative.

## 2023-11-10 ENCOUNTER — Ambulatory Visit: Payer: Medicare Other | Admitting: Neurology

## 2023-11-10 ENCOUNTER — Encounter: Payer: Self-pay | Admitting: Neurology

## 2023-11-10 VITALS — BP 120/69 | HR 76 | Ht 64.0 in | Wt 143.0 lb

## 2023-11-10 DIAGNOSIS — G20A2 Parkinson's disease without dyskinesia, with fluctuations: Secondary | ICD-10-CM | POA: Diagnosis not present

## 2023-11-10 MED ORDER — CARBIDOPA-LEVODOPA 25-100 MG PO TABS
1.5000 | ORAL_TABLET | Freq: Four times a day (QID) | ORAL | 1 refills | Status: DC
Start: 2023-11-10 — End: 2024-03-27

## 2023-11-10 NOTE — Progress Notes (Signed)
 Subjective:    Patient ID: Melinda Lane is a 73 y.o. female.  HPI    Interim history:   Melinda Lane is a 73 year old right-handed woman with an underlying medical history of hypothyroidism, Crohn's disease, hypertension, hyperlipidemia, irritable bowel syndrome, arthritis, Parkinson's disease and anxiety, who presents for FU consultation of her PD and RLS. The patient is unaccompanied today. I last saw her on 07/21/2023, at which time she reported having some residual knee pain after her knee replacement surgery about 5 weeks prior, she felt fairly stable with regards to her Parkinson's symptoms.  She was advised to continue with levodopa 4 times a day, separated from her mealtimes.  She was encouraged to be cautious with her driving.  She was advised to get her thyroid function checked with her PCP.  She was on low-dose hydrocodone at the time and also took Robaxin.  We talked about staying proactive about constipation.  Today, 11/10/2023: She reports difficulty with stiffness affecting primarily both legs, sometimes in the shower she has trouble turning and makes stutter steps.  Thankfully, she has not fallen.  She has no significant constipation but has noticed increase in tremor affecting primarily her right hand and handwriting has become sloppier and smaller.  She has neck stiffness and neck pain and is supposed to see a chiropractor but is advised not to have any drastic neck cracking or manipulations done but gentle traction and massaging may help.  She takes Robaxin very infrequently, 1 pill every 3 days, no longer is on hydrocodone which was given to her after her right knee replacement surgery in September 2024.  She has come along well with her right knee.    Of note, she called about a week ago reporting that her Sinemet was not strong enough any longer and that she had worsening stiffness in her legs and more tremor in her hands.  She was advised to make an appointment to discuss.  She tries  to hydrate well with water.  She has no major cognitive concerns.  The patient's allergies, current medications, family history, past medical history, past social history, past surgical history and problem list were reviewed and updated as appropriate.    Previously:  07/21/2023: She reports feeling fairly stable with regards to her Parkinson's symptoms.  She has not had any recent falls.  She had right total knee replacement 5 weeks ago under Dr. Devonne Doughty.  She is doing fairly well, continues to take some pain medication, half a pill of hydrocodone 3 times a day and Robaxin 3 times a day.  She feels that the medication is not sedating to her and she drives.  She takes low-dose pramipexole per PCP at night.  She continues to take generic Sinemet 4 times a day, she reported initially that she takes it with her 3 meals and at bedtime and then reported that she actually takes the medication about 30 to 60 minutes before her meals.  First dose of her levodopa is around 9 or 9:30 AM, second dose around 1:30 PM, third dose around 6 PM and last dose around 11:30 PM which is close to her bedtime.  She denies any significant issues with constipation and takes an over-the-counter stool softener.  She is due for her eye exam and has an appointment in January.  She wears contact lenses.  She reports that she hydrates well with water.  She is not sure when she had her last blood test for thyroid function.  She did have  a reduction in her thyroid medication, NP thyroid, currently at 60 mg daily, reduced from 90 mg.   I saw her on 04/21/2022, at which time she reported restless leg symptoms for the past 3+ weeks.  She had been on iron supplementation over-the-counter for years.  She had increased her levodopa on her own to 4 pills a day and she had used her daughter's prescription to alleviate restless leg symptoms including gabapentin at night and hydroxyzine.  We did some blood work at the time.  She had tapered off her  Mysoline.  She was advised to continue with levodopa 1 pill 4 times daily but discouraged from using her daughter's prescription or anyone else's prescriptions. Blood work from 04/22/2023 showed a below normal TSH at 0.04, CBC with differential and platelets was benign, iron studies showed benign findings, vitamin B12 552, folate about 20, ferritin level normal at 96.  She was advised to make a follow-up appointment with her primary care physician for thyroid dysfunction     I first met her on 12/07/2021, at which time I recommended that she taper off Mysoline as she had features of Parkinson's disease.  She was encouraged to continue with Sinemet 3 times a day and advised to proceed with a DaTscan.  She had an interim DaTscan on 01/29/2022 which showed:  IMPRESSION:   Significant decreased striatal Ioflupane activity as above. This pattern can be seen in Parkinsonian syndromes.   Of note, DaTSCAN is not diagnostic of Parkinsonian syndromes, which remains a clinical diagnosis. DaTscan is an adjuvant test to aid in the clinical diagnosis of Parkinsonian syndromes.     She was notified of the test results and advised to continue with Sinemet.       12/07/21 (SA): She has previously seen Dr. Anne Hahn in this office and was last seen by him on 04/14/2021.  I reviewed the note and copied the note below for reference.  She has been on low-dose Sinemet as well as Mysoline for her hand tremor.   She had previously seen Dr. Royston Sinner for her tremor.  I reviewed his office note through [facility] on 01/30/2020.  At that time she reported that the Sinemet was not helping her tremor.  She was advised to stop Sinemet at the time.     She was also evaluated by Dr. Terrace Arabia office in March 2016 for headaches.  I reviewed her office note from 11/15/2014.   She had a head CT without contrast on 11/12/2014 and I reviewed the results: IMPRESSION: No intracranial mass, hemorrhage, or focal gray -white compartment  lesion/acute appearing infarct. Stable pineal calcification. Stability of this calcification over several years is consistent with benign etiology.     04/14/21 (Dr. Anne Hahn): << Ms. Overdorf is a 73 year old right-handed white female with a history of a right upper extremity tremor.  The patient has been felt to have Parkinson's disease, she was placed on Sinemet taking 25/100 mg tablets, 1 tablet 3 times daily.  The patient has also been treated through Dr. Hyacinth Meeker in the past, he has treated her for essential tremor.  She is now on Mysoline taking 100 mg 3 times daily, she is tolerating the medication well and she does believe that this has helped her tremor.  She has noted some ongoing problems with micrographia and some recent troubles with getting up off the floor if she is playing with her grandchildren.  She has not had any falls.  She denies any leg tremor.  She has not had any problems with speech or swallowing.  She returns to this office for further evaluation.  She does take Benadryl at night which helps her tremors.>>  Her Past Medical History Is Significant For: Past Medical History:  Diagnosis Date   Absence of bladder continence 11/15/2014   Acquired hypothyroidism 04/12/2012   Adjustment disorder with anxiety 02/17/2013   patient states she has no known diagnosis of this, doesn't take medication for this, states her primary care doesn't even have it noted in their records   Arterial fibromuscular dysplasia (HCC) 08/25/2011   Arthralgia of both hands 11/22/2017   Benign renovascular hypertension 06/23/2011   Formatting of this note might be different from the original. right   Cephalalgia 11/15/2014   Chest pain 04/12/2012   Crohn's disease of ileum without complication (HCC) 03/30/2016   Elevated troponin    Esophageal dysphagia 01/13/2017   HTN (hypertension) 04/12/2012   Hyperlipemia 04/12/2012   Hyperlipidemia 04/12/2012   Hypothyroidism    IBS (irritable bowel syndrome)     Incontinence in female 11/12/2014   Left knee pain 10/14/2011   Parkinson's disease (HCC) 07/24/2019   Pes anserine bursitis 10/14/2011   Primary osteoarthritis involving multiple joints 11/25/2017   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(HCC)    RLS (restless legs syndrome) 10/10/2019   Thyroid nodule 04/12/2012   Tremor 10/10/2019    Her Past Surgical History Is Significant For: Past Surgical History:  Procedure Laterality Date   APPENDECTOMY     AUGMENTATION MAMMAPLASTY     saline - 2015   CHOLECYSTECTOMY     ECTOPIC PREGNANCY SURGERY     KNEE SURGERY     LEFT HEART CATH AND CORONARY ANGIOGRAPHY N/A 03/11/2021   Procedure: LEFT HEART CATH AND CORONARY ANGIOGRAPHY;  Surgeon: Lennette Bihari, MD;  Location: [facility];  Service: Cardiovascular;  Laterality: N/A;   TONSILLECTOMY      Her Family History Is Significant For: Family History  Problem Relation Age of Onset   Hypertension Mother    Heart disease Mother    Restless legs syndrome Mother    Parkinson's disease Mother    Hypertension Father    Heart disease Father     Her Social History Is Significant For: Social History   Socioeconomic History   Marital status: Married    Spouse name: Not on file   Number of children: 2   Years of education: HS   Highest education level: Not on file  Occupational History   Occupation: Accounting  Tobacco Use   Smoking status: Never   Smokeless tobacco: Never  Vaping Use   Vaping status: Never Used  Substance and Sexual Activity   Alcohol use: Yes    Alcohol/week: 4.0 standard drinks of alcohol    Types: 4 Glasses of wine per week    Comment: One weekly - 2 glasses of wine   Drug use: No   Sexual activity: Yes    Birth control/protection: Post-menopausal    Comment: 1st intercourse 73 yo-Fewer than 5 partners  Other Topics Concern   Not on file  Social History Narrative   Lives at home with her husband.   Right-handed.   1 cup/day of tea    Social Drivers of Manufacturing engineer Strain: Low Risk  (10/12/2023)   Received from [facility]   Overall Financial Resource Strain (CARDIA)    Difficulty of Paying Living Expenses: Not hard at all  Food Insecurity: No Food Insecurity (  10/12/2023)   Received from [facility]   Hunger Vital Sign    Worried About Running Out of Food in the Last Year: Never true    Ran Out of Food in the Last Year: Never true  Transportation Needs: No Transportation Needs (10/12/2023)   Received from [facility]    Lack of Transportation (Medical): No    Lack of Transportation (Non-Medical): No  Physical Activity: Not on file  Stress: Not on file  Social Connections: Unknown (01/16/2022)   Received from [facility]   Social Network    Social Network: Not on file    Her Allergies Are:  Allergies  Allergen Reactions   Zithromax [Azithromycin] Rash  :   Her Current Medications Are:  Outpatient Encounter Medications as of 11/10/2023  Medication Sig   amLODipine (NORVASC) 5 MG tablet Take by mouth 2 (two) times daily.   carbidopa-levodopa (SINEMET IR) 25-100 MG tablet TAKE 1 TABLET BY MOUTH FOUR TIMES DAILY   celecoxib (CELEBREX) 200 MG capsule Take 200 mg by mouth daily.   cholecalciferol (VITAMIN D) 1000 UNITS tablet Take 1,000 Units by mouth daily.   dicyclomine (BENTYL) 20 MG tablet Take 20 mg by mouth 4 (four) times daily.   ferrous sulfate 325 (65 FE) MG tablet Take 325 mg by mouth daily with breakfast.   ibuprofen (ADVIL) 800 MG tablet Take 800 mg by mouth 3 (three) times daily.   methocarbamol (ROBAXIN) 500 MG tablet Take 1 tablet every 6-8 hours by oral route as needed for spasm for 10 days.   Multiple Vitamin (MULTIVITAMIN) tablet Take 1 tablet by mouth daily.   NONFORMULARY OR COMPOUNDED ITEM Apply 4-6 tablets topically every 4 (four) months. Estrogen and Testosterone pellets put under the skin   NP THYROID 90 MG tablet Take 60 mg by mouth every morning.    Omega-3 Fatty Acids (FISH OIL) 1000 MG CAPS Take 1,000 mg by mouth daily.   pramipexole (MIRAPEX) 0.25 MG tablet Take 0.5 mg by mouth daily.   progesterone (PROMETRIUM) 200 MG capsule Take 300 mg by mouth daily. Compound Rx   SKYRIZI 360 MG/2.4ML SOCT Inject into the skin.   telmisartan (MICARDIS) 80 MG tablet Take 80 mg by mouth daily.   amLODipine (NORVASC) 5 MG tablet Take 1 tablet (5 mg total) by mouth daily. (Patient taking differently: Take 5 mg by mouth 2 (two) times daily.)   HYDROcodone-acetaminophen (NORCO) 10-325 MG tablet Take 1 tablet by mouth every 8 (eight) hours as needed. 1/2 tablet 3x daily (Patient not taking: Reported on 11/10/2023)   mesalamine (PENTASA) 500 MG CR capsule Take 1,000 mg by mouth 2 (two) times daily.  (Patient not taking: Reported on 11/10/2023)   spironolactone (ALDACTONE) 25 MG tablet Take 25 mg by mouth daily. (Patient not taking: Reported on 11/10/2023)   No facility-administered encounter medications on file as of 11/10/2023.  :  Review of Systems:  Out of a complete 14 point review of systems, all are reviewed and negative with the exception of these symptoms as listed below:  Review of Systems  Neurological:        Patient in room #9 and alone. Patient states she been having more issues with her balance and shaking of the right hand. Patient states her legs has been fell stiff.    Objective:  Neurological Exam  Physical Exam Physical Examination:   Vitals:   11/10/23 0856  BP: 120/69  Pulse: 76  General Examination: The patient is a very pleasant 73 y.o. female in no acute distress. She appears well-developed and well groomed.   HEENT: Normocephalic, atraumatic, pupils are equal, round and reactive to light, extraocular tracking well-preserved.  Mild to moderate decrease in eye blink rate, mild to moderate facial masking. Mild to moderate nuchal rigidity noted.  Speech is clear without dysarthria, but mild hypophonia noted.  No voice tremor.   Hearing grossly intact. Airway examination reveals benign findings, mild mouth dryness noted.  Tongue protrudes centrally and palate elevates symmetrically, no carotid bruits.     Chest: Clear to auscultation without wheezing, rhonchi or crackles noted.   Heart: S1+S2+0, mild systolic murmur noted, not new.     Abdomen: Soft, non-tender and non-distended.   Extremities: There is trace pitting edema in the right ankle area.    Skin: Warm and dry without trophic changes noted.    Musculoskeletal: exam reveals arthritic changes in the hands, right knee wider compared to left but good mobility noted.      Neurologically:  Mental status: The patient is awake, alert and oriented in all 4 spheres. Her immediate and remote memory, attention, language skills and fund of knowledge are appropriate. There is no evidence of aphasia, agnosia, apraxia or anomia. Speech is clear with normal prosody and enunciation. Thought process is linear. Mood is normal and affect is normal.  Cranial nerves II - XII are as described above under HEENT exam.  Motor exam: Normal bulk, and normal strength noted, mild increase in tone in right and left upper extremity, intermittent very mild resting tremor in the right upper extremity noted, no other resting tremor.   Fine motor skills and coordination: Moderate difficulty with finger taps, hand movements, and foot taps bilaterally, with lateralization to the right.  She has no obvious postural or action tremor in the upper extremities bilaterally.   Cerebellar testing: No dysmetria or intention tremor. There is no truncal or gait ataxia.  Sensory exam: intact to light touch in the upper and lower extremities.  Gait, station and balance: She stands without difficulty, posture is mild to moderately stooped for age, she walks with a decrease in arm swing on the right more than left, turns well, balance fairly well-preserved.  Slight limp on the right, no walking aid.    Assessment and Plan:    In summary, Tamecca Armas is a 73 year old right-handed woman with an underlying medical history of hypothyroidism, Crohn's disease (on [place]), hypertension, hyperlipidemia, irritable bowel syndrome, arthritis with status post right total knee replacement in September 2024, Parkinson's disease and anxiety, who presents for FU consultation of her PD. She has been on a stable dose of levodopa 1 pill 4 times daily for the past years.  She is had progression in her motor symptoms over time.  She has neck stiffness and is supposed to see a chiropractor but is advised not to have any drastic manipulation or neck cracking down to her neck gentle traction and massaging may help.  She is also followed by integrative health for her thyroid function and thyroid medicine.  She takes pramipexole per PCP for her restless legs.  She is advised to increase her carbidopa-levodopa 25-100 mg strength to 1-1/2 pills 4 times a day at this time.  She has tolerated the medication and has benefited from levodopa therapy. She is cautioned about the sedated properties of Robaxin.  She is advised to stay well-hydrated and be proactive about constipation issues.  She is advised to  follow-up routinely in this clinic in about 6 to 8 months to see one of our nurse practitioners. She had a DaTscan on 01/29/22, which showed significantly decreased striatal Ioflupane activity.  I answered all her questions today and she was in agreement.  I spent 30 minutes in total face-to-face time and in reviewing records during pre-charting, more than 50% of which was spent in counseling and coordination of care, reviewing test results, reviewing medications and treatment regimen and/or in discussing or reviewing the diagnosis of PD, the prognosis and treatment options. Pertinent laboratory and imaging test results that were available during this visit with the patient were reviewed by me and considered in my medical decision making  (see chart for details).

## 2023-12-19 ENCOUNTER — Telehealth (HOSPITAL_COMMUNITY): Payer: Self-pay | Admitting: *Deleted

## 2023-12-19 NOTE — Telephone Encounter (Signed)
 Received referral from Mariann Barter at [facility] for this pt to participate in Cardiac rehab s/p 3/18 AV Replacement.  Called and left message with intent on interest of participating here at [facility] or [place].  Pt lives in [place]. Has upcoming post hospitalization follow up appt on 4/9 with cardiology and 4/11 CVTS. Alanson Aly, BSN Cardiac and Emergency planning/management officer

## 2023-12-28 ENCOUNTER — Telehealth (HOSPITAL_COMMUNITY): Payer: Self-pay

## 2023-12-28 NOTE — Telephone Encounter (Signed)
 Pt insurance is active and benefits verified through [facility] Medicare Co-pay 0, DED 0/0 met, out of pocket $3,900/$528.80 met, co-insurance 0%. no pre-authorization required. Passport, 12/28/2023@11 :34, REF# [id number]   How many CR sessions are covered? (ICR)72 Is this a lifetime maximum or an annual maximum? annual Has the member used any of these services to date? no Is there a time limit (weeks/months) on start of program and/or program completion? no

## 2024-01-11 ENCOUNTER — Ambulatory Visit (HOSPITAL_COMMUNITY)

## 2024-01-11 ENCOUNTER — Encounter (HOSPITAL_COMMUNITY)
Admission: RE | Admit: 2024-01-11 | Discharge: 2024-01-11 | Disposition: A | Discharge status: Home / Self Care | Source: Ambulatory Visit | Attending: Cardiology | Admitting: Cardiology

## 2024-01-11 VITALS — BP 132/72 | HR 66 | Ht 64.5 in | Wt 142.2 lb

## 2024-01-11 DIAGNOSIS — Z952 Presence of prosthetic heart valve: Secondary | ICD-10-CM | POA: Diagnosis present

## 2024-01-11 NOTE — Progress Notes (Signed)
 Cardiac Rehab Medication Review   Does the patient  feel that his/her medications are working for him/her? Yes    Has the patient been experiencing any side effects to the medications prescribed? No  Does the patient measure his/her own blood pressure or blood glucose at home?   Yes  Does the patient have any problems obtaining medications due to transportation or finances?   No  Understanding of regimen: good Understanding of indications: good Potential of compliance: excellent    Comments: Genessy has a good understanding of her medications and regime. She checks her BP occasionally.    Arlander Labrum, MS, ACSM-CEP 01/11/2024 2:29 PM

## 2024-01-11 NOTE — Progress Notes (Signed)
 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  Patient's Home Medications on Admission:  Current Outpatient Medications:    amoxicillin (AMOXIL) 500 MG tablet, Take 500 mg by mouth as needed (dental appts)., Disp: , Rfl:    aspirin  buffered (BUFFERIN) 325 MG TABS tablet, Take 325 mg by mouth daily., Disp: , Rfl:    carbidopa -levodopa  (SINEMET  IR) 25-100 MG tablet, Take 1.5 tablets by mouth 4 (four) times daily., Disp: 540 tablet, Rfl: 1   celecoxib (CELEBREX) 200 MG capsule, Take 200 mg by mouth daily., Disp: , Rfl:    cholecalciferol  (VITAMIN D) 1000 UNITS tablet, Take 1,000 Units by mouth daily., Disp: , Rfl:    dicyclomine (BENTYL) 20 MG tablet, Take 20 mg by mouth 4 (four) times daily., Disp: , Rfl:    docusate sodium (COLACE) 100 MG capsule, Take 100 mg by mouth 2 (two) times daily., Disp: , Rfl:    ferrous sulfate 325 (65 FE) MG tablet, Take 325 mg by mouth daily with breakfast., Disp: , Rfl:    guaiFENesin (MUCINEX) 600 MG 12 hr tablet, Take 600 mg by mouth 2 (two) times daily as needed for cough., Disp: , Rfl:    ibuprofen  (ADVIL ) 800 MG tablet, Take 800 mg by mouth 3 (three) times daily., Disp: , Rfl:    MAGNESIUM PO, Take 200 mg by mouth daily., Disp: , Rfl:    methocarbamol (ROBAXIN) 500 MG tablet, Take 1 tablet every 6-8 hours by oral route as needed for spasm for 10 days., Disp: , Rfl:    metoprolol  succinate (TOPROL -XL) 25 MG 24 hr tablet, Take 25 mg by mouth daily., Disp: , Rfl:    Multiple  Vitamin (MULTIVITAMIN) tablet, Take 1 tablet by mouth daily., Disp: , Rfl:    NONFORMULARY OR COMPOUNDED ITEM, Apply 4-6 tablets topically every 4 (four) months. Estrogen and Testosterone pellets put under the skin, Disp: , Rfl:    NP THYROID  90 MG tablet, Take 60 mg by mouth every morning., Disp: , Rfl:    Omega-3 Fatty Acids (FISH OIL) 1000 MG CAPS, Take 1,000 mg by mouth daily., Disp: , Rfl:    pramipexole (MIRAPEX) 0.25 MG tablet, Take 0.5 mg by mouth daily., Disp: , Rfl:    progesterone  (PROMETRIUM ) 200 MG capsule, Take 300 mg by mouth daily. Compound Rx, Disp: , Rfl:    SKYRIZI 360 MG/2.4ML SOCT, Inject into the skin., Disp: , Rfl:    traZODone (DESYREL) 100 MG tablet, Take 100 mg by mouth at bedtime., Disp: , Rfl:    amLODipine  (NORVASC ) 5 MG tablet, Take 1 tablet (5 mg total) by mouth daily. (Patient taking differently: Take 5 mg by mouth 2 (two) times daily.), Disp: 30 tablet, Rfl: 0   amLODipine  (NORVASC ) 5 MG tablet, Take by mouth 2 (two) times daily. (Patient not taking: Reported on 01/11/2024), Disp: , Rfl:    HYDROcodone -acetaminophen  (NORCO) 10-325 MG tablet, Take 1 tablet by mouth every 8 (eight) hours as needed. 1/2 tablet 3x daily (Patient not taking: Reported on  01/11/2024), Disp: , Rfl:    mesalamine  (PENTASA ) 500 MG CR capsule, Take 1,000 mg by mouth 2 (two) times daily.  (Patient not taking: Reported on 11/10/2023), Disp: , Rfl:    progesterone  (PROMETRIUM ) 200 MG capsule, Take 200 mg by mouth daily. (Patient not taking: Reported on 01/11/2024), Disp: , Rfl:    spironolactone (ALDACTONE) 25 MG tablet, Take 25 mg by mouth daily. (Patient not taking: Reported on 01/11/2024), Disp: , Rfl:    telmisartan (MICARDIS) 80 MG tablet, Take 80 mg by mouth daily. 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 right   Cephalalgia 11/15/2014   Chest pain 04/12/2012   Crohn's disease of ileum without complication (HCC) 03/30/2016   Elevated troponin    Esophageal dysphagia 01/13/2017   HTN (hypertension) 04/12/2012   Hyperlipemia 04/12/2012   Hyperlipidemia 04/12/2012   Hypothyroidism    IBS (irritable bowel syndrome)    Incontinence in female 11/12/2014   Left knee pain 10/14/2011   Parkinson's disease (HCC) 07/24/2019   Pes anserine bursitis 10/14/2011   Primary osteoarthritis involving multiple joints 11/25/2017   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(HCC)    RLS (restless legs syndrome) 10/10/2019   Thyroid  nodule 04/12/2012   Tremor 10/10/2019    Tobacco Use: Social History   Tobacco Use  Smoking Status Never  Smokeless Tobacco Never    Labs: Review Flowsheet       Latest Ref Rng & Units 04/12/2012  Labs for ITP Cardiac and Pulmonary Rehab  Cholestrol 0 - 200 mg/dL 578   LDL (calc) 0 - 99 mg/dL 95   HDL-C >46 mg/dL 69   Trlycerides <962 mg/dL 40     Capillary Blood Glucose: No results found for: "GLUCAP"   Exercise Target Goals: Exercise Program Goal: Individual exercise prescription set using results from initial 6 min walk test and THRR while considering  patient's activity barriers and safety.   Exercise Prescription Goal: Initial exercise prescription builds to 30-45 minutes a day of aerobic activity, 2-3 days per week.  Home exercise guidelines will be given to patient during program as part of exercise prescription that the participant will acknowledge.  Activity Barriers & Risk Stratification:   Activity Barriers & Cardiac Risk Stratification - 01/11/24 1430       Activity Barriers & Cardiac Risk Stratification   Activity Barriers Right Knee Replacement;Balance Concerns;Shortness of Breath;Joint Problems    Cardiac Risk Stratification High   <5 METs on            6 Minute Walk:  6 Minute Walk     Row Name 01/11/24 1534         6 Minute Walk   Phase Initial     Distance 1410 feet     Walk Time 6 minutes     # of Rest Breaks 0     MPH 2.67     METS 3.5     RPE 9     Perceived Dyspnea  1     VO2 Peak 12.25     Symptoms Yes (comment)     Comments SOB, resolved with rest     Resting HR 66 bpm     Resting BP 132/72     Resting Oxygen Saturation  98 %     Exercise Oxygen Saturation  during 6 min walk 98 %     Max Ex. HR 102 bpm     Max Ex. BP 164/88     2 Minute Post BP 142/78              Oxygen Initial Assessment:   Oxygen Re-Evaluation:   Oxygen Discharge (Final Oxygen Re-Evaluation):   Initial Exercise Prescription:  Initial Exercise Prescription - 01/11/24 1500       Date of Initial Exercise RX and Referring Provider   Date 01/11/24    Referring Provider Gaylyn Keas, MD    Expected Discharge Date 04/18/24      NuStep   Level 1    SPM 70    Minutes 15    METs 2      Track   Laps 16    Minutes 15    METs 2.5      Prescription Details   Frequency (times per week) 3    Duration Progress to 30 minutes of continuous aerobic without signs/symptoms of physical distress      Intensity   THRR 40-80% of Max Heartrate 60-120    Ratings of Perceived Exertion 11-13    Perceived Dyspnea 0-4      Progression   Progression Continue progressive overload as per policy without signs/symptoms or physical distress.      Resistance Training   Training Prescription Yes    Weight 3    Reps 10-15             Perform Capillary Blood Glucose checks as needed.  Exercise Prescription Changes:   Exercise Comments:   Exercise  Goals and Review:   Exercise Goals     Row Name 01/11/24 1430             Exercise Goals   Increase Physical Activity Yes       Intervention Provide advice, education, support and counseling about physical activity/exercise needs.;Develop an individualized exercise prescription for aerobic and resistive training based on initial evaluation findings, risk stratification, comorbidities and participant's personal goals.       Expected Outcomes Short Term: Attend rehab on a regular basis to increase amount of physical activity.;Long Term: Exercising regularly at least 3-5 days a week.;Long Term: Add in home exercise to make exercise part of routine and to increase amount of physical activity.       Increase Strength and Stamina Yes       Intervention Provide advice, education, support and counseling about physical activity/exercise needs.;Develop an individualized exercise prescription for aerobic and resistive training based on initial evaluation findings, risk stratification, comorbidities and participant's personal goals.       Expected Outcomes Short Term: Increase workloads from initial exercise prescription for resistance, speed, and METs.;Short Term: Perform resistance training exercises routinely during rehab and add in resistance training at home;Long Term: Improve cardiorespiratory fitness, muscular endurance and strength as measured by increased METs and functional capacity ( )       Able to understand and use rate of perceived exertion (RPE) scale Yes       Intervention Provide education and explanation on how to use RPE scale  Expected Outcomes Short Term: Able to use RPE daily in rehab to express subjective intensity level;Long Term:  Able to use RPE to guide intensity level when exercising independently       Knowledge and understanding of Target Heart Rate Range (THRR) Yes       Intervention Provide education and explanation of THRR including how the numbers were predicted  and where they are located for reference       Expected Outcomes Short Term: Able to state/look up THRR;Short Term: Able to use daily as guideline for intensity in rehab;Long Term: Able to use THRR to govern intensity when exercising independently       Understanding of Exercise Prescription Yes       Intervention Provide education, explanation, and written materials on patient's individual exercise prescription       Expected Outcomes Short Term: Able to explain program exercise prescription;Long Term: Able to explain home exercise prescription to exercise independently                Exercise Goals Re-Evaluation :   Discharge Exercise Prescription (Final Exercise Prescription Changes):   Nutrition:  Target Goals: Understanding of nutrition guidelines, daily intake of sodium 1500mg , cholesterol 200mg , calories 30% from fat and 7% or less from saturated fats, daily to have 5 or more servings of fruits and vegetables.  Biometrics:  Pre Biometrics - 01/11/24 1419       Pre Biometrics   Waist Circumference 32 inches    Hip Circumference 36 inches    Waist to Hip Ratio 0.89 %    Triceps Skinfold 25 mm    % Body Fat 35.5 %    Grip Strength 30 kg    Flexibility 11.5 in    Single Leg Stand 4.62 seconds              Nutrition Therapy Plan and Nutrition Goals:   Nutrition Assessments:  MEDIFICTS Score Key: >=70 Need to make dietary changes  40-70 Heart Healthy Diet <= 40 Therapeutic Level Cholesterol Diet    Picture Your Plate Scores: <86 Unhealthy dietary pattern with much room for improvement. 41-50 Dietary pattern unlikely to meet recommendations for good health and room for improvement. 51-60 More healthful dietary pattern, with some room for improvement.  >60 Healthy dietary pattern, although there may be some specific behaviors that could be improved.    Nutrition Goals Re-Evaluation:   Nutrition Goals Re-Evaluation:   Nutrition Goals Discharge (Final  Nutrition Goals Re-Evaluation):   Psychosocial: Target Goals: Acknowledge presence or absence of significant depression and/or stress, maximize coping skills, provide positive support system. Participant is able to verbalize types and ability to use techniques and skills needed for reducing stress and depression.  Initial Review & Psychosocial Screening:  Initial Psych Review & Screening - 01/11/24 1431       Initial Review   Current issues with None Identified      Family Dynamics   Good Support System? Yes   husband and very close family     Barriers   Psychosocial barriers to participate in program There are no identifiable barriers or psychosocial needs.      Screening Interventions   Interventions Encouraged to exercise;Provide feedback about the scores to participant    Expected Outcomes Short Term goal: Identification and review with participant of any Quality of Life or Depression concerns found by scoring the questionnaire.;Long Term goal: The participant improves quality of Life and PHQ9 Scores as seen by post scores and/or verbalization  of changes             Quality of Life Scores:  Quality of Life - 01/11/24 1537       Quality of Life   Select Quality of Life      Quality of Life Scores   Health/Function Pre 27.6 %    Socioeconomic Pre 30 %    Psych/Spiritual Pre 30 %    Family Pre 30 %    GLOBAL Pre 28.94 %            Scores of 19 and below usually indicate a poorer quality of life in these areas.  A difference of  2-3 points is a clinically meaningful difference.  A difference of 2-3 points in the total score of the Quality of Life Index has been associated with significant improvement in overall quality of life, self-image, physical symptoms, and general health in studies assessing change in quality of life.  PHQ-9: Review Flowsheet       01/11/2024  Depression screen PHQ 2/9  Decreased Interest 0  Down, Depressed, Hopeless 0  PHQ - 2 Score 0   Altered sleeping 1  Tired, decreased energy 1  Change in appetite 0  Feeling bad or failure about yourself  0  Trouble concentrating 0  Moving slowly or fidgety/restless 0  Suicidal thoughts 0  PHQ-9 Score 2  Difficult doing work/chores Somewhat difficult   Interpretation of Total Score  Total Score Depression Severity:  1-4 = Minimal depression, 5-9 = Mild depression, 10-14 = Moderate depression, 15-19 = Moderately severe depression, 20-27 = Severe depression   Psychosocial Evaluation and Intervention:   Psychosocial Re-Evaluation:   Psychosocial Discharge (Final Psychosocial Re-Evaluation):   Vocational Rehabilitation: Provide vocational rehab assistance to qualifying candidates.   Vocational Rehab Evaluation & Intervention:  Vocational Rehab - 01/11/24 1431       Initial Vocational Rehab Evaluation & Intervention   Assessment shows need for Vocational Rehabilitation No   [employer] is working            Education: Education Goals: Education classes will be provided on a weekly basis, covering required topics. Participant will state understanding/return demonstration of topics presented.     Core Videos: Exercise    Move It!  Clinical staff conducted group or individual video education with verbal and written material and guidebook.  Patient learns the recommended Pritikin exercise program. Exercise with the goal of living a long, healthy life. Some of the health benefits of exercise include controlled diabetes, healthier blood pressure levels, improved cholesterol levels, improved heart and lung capacity, improved sleep, and better body composition. Everyone should speak with their doctor before starting or changing an exercise routine.  Biomechanical Limitations Clinical staff conducted group or individual video education with verbal and written material and guidebook.  Patient learns how biomechanical limitations can impact exercise and how we can mitigate and  possibly overcome limitations to have an impactful and balanced exercise routine.  Body Composition Clinical staff conducted group or individual video education with verbal and written material and guidebook.  Patient learns that body composition (ratio of muscle mass to fat mass) is a key component to assessing overall fitness, rather than body weight alone. Increased fat mass, especially visceral belly fat, can put us  at increased risk for metabolic syndrome, type 2 diabetes, heart disease, and even death. It is recommended to combine diet and exercise (cardiovascular and resistance training) to improve your body composition. Seek guidance from your physician and exercise physiologist  before implementing an exercise routine.  Exercise Action Plan Clinical staff conducted group or individual video education with verbal and written material and guidebook.  Patient learns the recommended strategies to achieve and enjoy long-term exercise adherence, including variety, self-motivation, self-efficacy, and positive decision making. Benefits of exercise include fitness, good health, weight management, more energy, better sleep, less stress, and overall well-being.  Medical   Heart Disease Risk Reduction Clinical staff conducted group or individual video education with verbal and written material and guidebook.  Patient learns our heart is our most vital organ as it circulates oxygen, nutrients, white blood cells, and hormones throughout the entire body, and carries waste away. Data supports a plant-based eating plan like the Pritikin Program for its effectiveness in slowing progression of and reversing heart disease. The video provides a number of recommendations to address heart disease.   Metabolic Syndrome and Belly Fat  Clinical staff conducted group or individual video education with verbal and written material and guidebook.  Patient learns what metabolic syndrome is, how it leads to heart disease,  and how one can reverse it and keep it from coming back. You have metabolic syndrome if you have 3 of the following 5 criteria: abdominal obesity, high blood pressure, high triglycerides, low HDL cholesterol, and high blood sugar.  Hypertension and Heart Disease Clinical staff conducted group or individual video education with verbal and written material and guidebook.  Patient learns that high blood pressure, or hypertension, is very common in the [place] . Hypertension is largely due to excessive salt intake, but other important risk factors include being overweight, physical inactivity, drinking too much alcohol, smoking, and not eating enough potassium from fruits and vegetables. High blood pressure is a leading risk factor for heart attack, stroke, congestive heart failure, dementia, kidney failure, and premature death. Long-term effects of excessive salt intake include stiffening of the arteries and thickening of heart muscle and organ damage. Recommendations include ways to reduce hypertension and the risk of heart disease.  Diseases of Our Time - Focusing on Diabetes Clinical staff conducted group or individual video education with verbal and written material and guidebook.  Patient learns why the best way to stop diseases of our time is prevention, through food and other lifestyle changes. Medicine (such as prescription pills and surgeries) is often only a Band-Aid on the problem, not a long-term solution. Most common diseases of our time include obesity, type 2 diabetes, hypertension, heart disease, and cancer. The Pritikin Program is recommended and has been proven to help reduce, reverse, and/or prevent the damaging effects of metabolic syndrome.  Nutrition   Overview of the Pritikin Eating Plan  Clinical staff conducted group or individual video education with verbal and written material and guidebook.  Patient learns about the Pritikin Eating Plan for disease risk reduction. The  Pritikin Eating Plan emphasizes a wide variety of unrefined, minimally-processed carbohydrates, like fruits, vegetables, whole grains, and legumes. Go, Caution, and Stop food choices are explained. Plant-based and lean animal proteins are emphasized. Rationale provided for low sodium intake for blood pressure control, low added sugars for blood sugar stabilization, and low added fats and oils for coronary artery disease risk reduction and weight management.  Calorie Density  Clinical staff conducted group or individual video education with verbal and written material and guidebook.  Patient learns about calorie density and how it impacts the Pritikin Eating Plan. Knowing the characteristics of the food you choose will help you decide whether those foods will lead to weight  gain or weight loss, and whether you want to consume more or less of them. Weight loss is usually a side effect of the Pritikin Eating Plan because of its focus on low calorie-dense foods.  Label Reading  Clinical staff conducted group or individual video education with verbal and written material and guidebook.  Patient learns about the Pritikin recommended label reading guidelines and corresponding recommendations regarding calorie density, added sugars, sodium content, and whole grains.  Dining Out - Part 1  Clinical staff conducted group or individual video education with verbal and written material and guidebook.  Patient learns that restaurant meals can be sabotaging because they can be so high in calories, fat, sodium, and/or sugar. Patient learns recommended strategies on how to positively address this and avoid unhealthy pitfalls.  Facts on Fats  Clinical staff conducted group or individual video education with verbal and written material and guidebook.  Patient learns that lifestyle modifications can be just as effective, if not more so, as many medications for lowering your risk of heart disease. A Pritikin lifestyle can  help to reduce your risk of inflammation and atherosclerosis (cholesterol build-up, or plaque, in the artery walls). Lifestyle interventions such as dietary choices and physical activity address the cause of atherosclerosis. A review of the types of fats and their impact on blood cholesterol levels, along with dietary recommendations to reduce fat intake is also included.  Nutrition Action Plan  Clinical staff conducted group or individual video education with verbal and written material and guidebook.  Patient learns how to incorporate Pritikin recommendations into their lifestyle. Recommendations include planning and keeping personal health goals in mind as an important part of their success.  Healthy Mind-Set    Healthy Minds, Bodies, Hearts  Clinical staff conducted group or individual video education with verbal and written material and guidebook.  Patient learns how to identify when they are stressed. Video will discuss the impact of that stress, as well as the many benefits of stress management. Patient will also be introduced to stress management techniques. The way we think, act, and feel has an impact on our hearts.  How Our Thoughts Can Heal Our Hearts  Clinical staff conducted group or individual video education with verbal and written material and guidebook.  Patient learns that negative thoughts can cause depression and anxiety. This can result in negative lifestyle behavior and serious health problems. Cognitive behavioral therapy is an effective method to help control our thoughts in order to change and improve our emotional outlook.  Additional Videos:  Exercise    Improving Performance  Clinical staff conducted group or individual video education with verbal and written material and guidebook.  Patient learns to use a non-linear approach by alternating intensity levels and lengths of time spent exercising to help burn more calories and lose more body fat. Cardiovascular exercise  helps improve heart health, metabolism, hormonal balance, blood sugar control, and recovery from fatigue. Resistance training improves strength, endurance, balance, coordination, reaction time, metabolism, and muscle mass. Flexibility exercise improves circulation, posture, and balance. Seek guidance from your physician and exercise physiologist before implementing an exercise routine and learn your capabilities and proper form for all exercise.  Introduction to Yoga  Clinical staff conducted group or individual video education with verbal and written material and guidebook.  Patient learns about yoga, a discipline of the coming together of mind, breath, and body. The benefits of yoga include improved flexibility, improved range of motion, better posture and core strength, increased lung function, weight  loss, and positive self-image. Yoga's heart health benefits include lowered blood pressure, healthier heart rate, decreased cholesterol and triglyceride levels, improved immune function, and reduced stress. Seek guidance from your physician and exercise physiologist before implementing an exercise routine and learn your capabilities and proper form for all exercise.  Medical   Aging: Enhancing Your Quality of Life  Clinical staff conducted group or individual video education with verbal and written material and guidebook.  Patient learns key strategies and recommendations to stay in good physical health and enhance quality of life, such as prevention strategies, having an advocate, securing a Health Care Proxy and Power of Attorney, and keeping a list of medications and system for tracking them. It also discusses how to avoid risk for bone loss.  Biology of Weight Control  Clinical staff conducted group or individual video education with verbal and written material and guidebook.  Patient learns that weight gain occurs because we consume more calories than we burn (eating more, moving less). Even if  your body weight is normal, you may have higher ratios of fat compared to muscle mass. Too much body fat puts you at increased risk for cardiovascular disease, heart attack, stroke, type 2 diabetes, and obesity-related cancers. In addition to exercise, following the Pritikin Eating Plan can help reduce your risk.  Decoding Lab Results  Clinical staff conducted group or individual video education with verbal and written material and guidebook.  Patient learns that lab test reflects one measurement whose values change over time and are influenced by many factors, including medication, stress, sleep, exercise, food, hydration, pre-existing medical conditions, and more. It is recommended to use the knowledge from this video to become more involved with your lab results and evaluate your numbers to speak with your doctor.   Diseases of Our Time - Overview  Clinical staff conducted group or individual video education with verbal and written material and guidebook.  Patient learns that according to the CDC, 50% to 70% of chronic diseases (such as obesity, type 2 diabetes, elevated lipids, hypertension, and heart disease) are avoidable through lifestyle improvements including healthier food choices, listening to satiety cues, and increased physical activity.  Sleep Disorders Clinical staff conducted group or individual video education with verbal and written material and guidebook.  Patient learns how good quality and duration of sleep are important to overall health and well-being. Patient also learns about sleep disorders and how they impact health along with recommendations to address them, including discussing with a physician.  Nutrition  Dining Out - Part 2 Clinical staff conducted group or individual video education with verbal and written material and guidebook.  Patient learns how to plan ahead and communicate in order to maximize their dining experience in a healthy and nutritious manner.  Included are recommended food choices based on the type of restaurant the patient is visiting.   Fueling a Banker conducted group or individual video education with verbal and written material and guidebook.  There is a strong connection between our food choices and our health. Diseases like obesity and type 2 diabetes are very prevalent and are in large-part due to lifestyle choices. The Pritikin Eating Plan provides plenty of food and hunger-curbing satisfaction. It is easy to follow, affordable, and helps reduce health risks.  Menu Workshop  Clinical staff conducted group or individual video education with verbal and written material and guidebook.  Patient learns that restaurant meals can sabotage health goals because they are often packed with calories, fat,  sodium, and sugar. Recommendations include strategies to plan ahead and to communicate with the manager, chef, or server to help order a healthier meal.  Planning Your Eating Strategy  Clinical staff conducted group or individual video education with verbal and written material and guidebook.  Patient learns about the Pritikin Eating Plan and its benefit of reducing the risk of disease. The Pritikin Eating Plan does not focus on calories. Instead, it emphasizes high-quality, nutrient-rich foods. By knowing the characteristics of the foods, we choose, we can determine their calorie density and make informed decisions.  Targeting Your Nutrition Priorities  Clinical staff conducted group or individual video education with verbal and written material and guidebook.  Patient learns that lifestyle habits have a tremendous impact on disease risk and progression. This video provides eating and physical activity recommendations based on your personal health goals, such as reducing LDL cholesterol, losing weight, preventing or controlling type 2 diabetes, and reducing high blood pressure.  Vitamins and Minerals  Clinical staff  conducted group or individual video education with verbal and written material and guidebook.  Patient learns different ways to obtain key vitamins and minerals, including through a recommended healthy diet. It is important to discuss all supplements you take with your doctor.   Healthy Mind-Set    Smoking Cessation  Clinical staff conducted group or individual video education with verbal and written material and guidebook.  Patient learns that cigarette smoking and tobacco addiction pose a serious health risk which affects millions of people. Stopping smoking will significantly reduce the risk of heart disease, lung disease, and many forms of cancer. Recommended strategies for quitting are covered, including working with your doctor to develop a successful plan.  Culinary   Becoming a Set designer conducted group or individual video education with verbal and written material and guidebook.  Patient learns that cooking at home can be healthy, cost-effective, quick, and puts them in control. Keys to cooking healthy recipes will include looking at your recipe, assessing your equipment needs, planning ahead, making it simple, choosing cost-effective seasonal ingredients, and limiting the use of added fats, salts, and sugars.  Cooking - Breakfast and Snacks  Clinical staff conducted group or individual video education with verbal and written material and guidebook.  Patient learns how important breakfast is to satiety and nutrition through the entire day. Recommendations include key foods to eat during breakfast to help stabilize blood sugar levels and to prevent overeating at meals later in the day. Planning ahead is also a key component.  Cooking - Educational psychologist conducted group or individual video education with verbal and written material and guidebook.  Patient learns eating strategies to improve overall health, including an approach to cook more at home.  Recommendations include thinking of animal protein as a side on your plate rather than center stage and focusing instead on lower calorie dense options like vegetables, fruits, whole grains, and plant-based proteins, such as beans. Making sauces in large quantities to freeze for later and leaving the skin on your vegetables are also recommended to maximize your experience.  Cooking - Healthy Salads and Dressing Clinical staff conducted group or individual video education with verbal and written material and guidebook.  Patient learns that vegetables, fruits, whole grains, and legumes are the foundations of the Pritikin Eating Plan. Recommendations include how to incorporate each of these in flavorful and healthy salads, and how to create homemade salad dressings. Proper handling of ingredients is also covered. Cooking -  Soups and Desserts  Cooking - Soups and Desserts Clinical staff conducted group or individual video education with verbal and written material and guidebook.  Patient learns that Pritikin soups and desserts make for easy, nutritious, and delicious snacks and meal components that are low in sodium, fat, sugar, and calorie density, while high in vitamins, minerals, and filling fiber. Recommendations include simple and healthy ideas for soups and desserts.   Overview     The Pritikin Solution Program Overview Clinical staff conducted group or individual video education with verbal and written material and guidebook.  Patient learns that the results of the Pritikin Program have been documented in more than 100 articles published in peer-reviewed journals, and the benefits include reducing risk factors for (and, in some cases, even reversing) high cholesterol, high blood pressure, type 2 diabetes, obesity, and more! An overview of the three key pillars of the Pritikin Program will be covered: eating well, doing regular exercise, and having a healthy mind-set.  WORKSHOPS   Exercise: Exercise Basics: Building Your Action Plan Clinical staff led group instruction and group discussion with PowerPoint presentation and patient guidebook. To enhance the learning environment the use of posters, models and videos may be added. At the conclusion of this workshop, patients will comprehend the difference between physical activity and exercise, as well as the benefits of incorporating both, into their routine. Patients will understand the FITT (Frequency, Intensity, Time, and Type) principle and how to use it to build an exercise action plan. In addition, safety concerns and other considerations for exercise and cardiac rehab will be addressed by the presenter. The purpose of this lesson is to promote a comprehensive and effective weekly exercise routine in order to improve patients' overall level of fitness.   Managing Heart Disease: Your Path to a Healthier Heart Clinical staff led group instruction and group discussion with PowerPoint presentation and patient guidebook. To enhance the learning environment the use of posters, models and videos may be added.At the conclusion of this workshop, patients will understand the anatomy and physiology of the heart. Additionally, they will understand how Pritikin's three pillars impact the risk factors, the progression, and the management of heart disease.  The purpose of this lesson is to provide a high-level overview of the heart, heart disease, and how the Pritikin lifestyle positively impacts risk factors.  Exercise Biomechanics Clinical staff led group instruction and group discussion with PowerPoint presentation and patient guidebook. To enhance the learning environment the use of posters, models and videos may be added. Patients will learn how the structural parts of their bodies function and how these functions impact their daily activities, movement, and exercise. Patients will learn how to promote a neutral spine, learn how  to manage pain, and identify ways to improve their physical movement in order to promote healthy living. The purpose of this lesson is to expose patients to common physical limitations that impact physical activity. Participants will learn practical ways to adapt and manage aches and pains, and to minimize their effect on regular exercise. Patients will learn how to maintain good posture while sitting, walking, and lifting.  Balance Training and Fall Prevention  Clinical staff led group instruction and group discussion with PowerPoint presentation and patient guidebook. To enhance the learning environment the use of posters, models and videos may be added. At the conclusion of this workshop, patients will understand the importance of their sensorimotor skills (vision, proprioception, and the vestibular system) in maintaining their ability to balance as they age. Patients  will apply a variety of balancing exercises that are appropriate for their current level of function. Patients will understand the common causes for poor balance, possible solutions to these problems, and ways to modify their physical environment in order to minimize their fall risk. The purpose of this lesson is to teach patients about the importance of maintaining balance as they age and ways to minimize their risk of falling.  WORKSHOPS   Nutrition:  Fueling a Ship broker led group instruction and group discussion with PowerPoint presentation and patient guidebook. To enhance the learning environment the use of posters, models and videos may be added. Patients will review the foundational principles of the Pritikin Eating Plan and understand what constitutes a serving size in each of the food groups. Patients will also learn Pritikin-friendly foods that are better choices when away from home and review make-ahead meal and snack options. Calorie density will be reviewed and applied to three nutrition priorities:  weight maintenance, weight loss, and weight gain. The purpose of this lesson is to reinforce (in a group setting) the key concepts around what patients are recommended to eat and how to apply these guidelines when away from home by planning and selecting Pritikin-friendly options. Patients will understand how calorie density may be adjusted for different weight management goals.  Mindful Eating  Clinical staff led group instruction and group discussion with PowerPoint presentation and patient guidebook. To enhance the learning environment the use of posters, models and videos may be added. Patients will briefly review the concepts of the Pritikin Eating Plan and the importance of low-calorie dense foods. The concept of mindful eating will be introduced as well as the importance of paying attention to internal hunger signals. Triggers for non-hunger eating and techniques for dealing with triggers will be explored. The purpose of this lesson is to provide patients with the opportunity to review the basic principles of the Pritikin Eating Plan, discuss the value of eating mindfully and how to measure internal cues of hunger and fullness using the Hunger Scale. Patients will also discuss reasons for non-hunger eating and learn strategies to use for controlling emotional eating.  Targeting Your Nutrition Priorities Clinical staff led group instruction and group discussion with PowerPoint presentation and patient guidebook. To enhance the learning environment the use of posters, models and videos may be added. Patients will learn how to determine their genetic susceptibility to disease by reviewing their family history. Patients will gain insight into the importance of diet as part of an overall healthy lifestyle in mitigating the impact of genetics and other environmental insults. The purpose of this lesson is to provide patients with the opportunity to assess their personal nutrition priorities by looking at  their family history, their own health history and current risk factors. Patients will also be able to discuss ways of prioritizing and modifying the Pritikin Eating Plan for their highest risk areas  Menu  Clinical staff led group instruction and group discussion with PowerPoint presentation and patient guidebook. To enhance the learning environment the use of posters, models and videos may be added. Using menus brought in from [employer], or printed from [employer], patients will apply the Pritikin dining out guidelines that were presented in the [employer] video. Patients will also be able to practice these guidelines in a variety of provided scenarios. The purpose of this lesson is to provide patients with the opportunity to practice hands-on learning of the [employer] guidelines with actual menus  and practice scenarios.  Label Reading Clinical staff led group instruction and group discussion with PowerPoint presentation and patient guidebook. To enhance the learning environment the use of posters, models and videos may be added. Patients will review and discuss the Pritikin label reading guidelines presented in Pritikin's Label Reading Educational series video. Using fool labels brought in from local grocery stores and markets, patients will apply the label reading guidelines and determine if the packaged food meet the Pritikin guidelines. The purpose of this lesson is to provide patients with the opportunity to review, discuss, and practice hands-on learning of the Pritikin Label Reading guidelines with actual packaged food labels. Cooking School  Pritikin's [employer] are designed to teach patients ways to prepare quick, simple, and affordable recipes at home. The importance of nutrition's role in chronic disease risk reduction is reflected in its emphasis in the overall Pritikin program. By learning how to prepare essential core Pritikin Eating  Plan recipes, patients will increase control over what they eat; be able to customize the flavor of foods without the use of added salt, sugar, or fat; and improve the quality of the food they consume. By learning a set of core recipes which are easily assembled, quickly prepared, and affordable, patients are more likely to prepare more healthy foods at home. These workshops focus on convenient breakfasts, simple entres, side dishes, and desserts which can be prepared with minimal effort and are consistent with nutrition recommendations for cardiovascular risk reduction. Cooking [employer] are taught by a Armed forces logistics/support/administrative officer (RD) who has been trained by the [employer]. The chef or RD has a clear understanding of the importance of minimizing - if not completely eliminating - added fat, sugar, and sodium in recipes. Throughout the series of Cooking School Workshop sessions, patients will learn about healthy ingredients and efficient methods of cooking to build confidence in their capability to prepare    Cooking School weekly topics:  Adding Flavor- Sodium-Free  Fast and Healthy Breakfasts  Powerhouse Plant-Based Proteins  Satisfying Salads and Dressings  Simple Sides and Sauces  International Cuisine-Spotlight on the [employer] Zones  Delicious Desserts  Savory Soups  [employer] - Meals in a Astronomer Appetizers and Snacks  Comforting Weekend Breakfasts  One-Pot Wonders   Fast Evening Meals  Landscape architect Your Pritikin Plate  WORKSHOPS   Healthy Mindset (Psychosocial):  Focused Goals, Sustainable Changes Clinical staff led group instruction and group discussion with PowerPoint presentation and patient guidebook. To enhance the learning environment the use of posters, models and videos may be added. Patients will be able to apply effective goal setting strategies to establish at least one personal goal, and then take consistent, meaningful  action toward that goal. They will learn to identify common barriers to achieving personal goals and develop strategies to overcome them. Patients will also gain an understanding of how our mind-set can impact our ability to achieve goals and the importance of cultivating a positive and growth-oriented mind-set. The purpose of this lesson is to provide patients with a deeper understanding of how to set and achieve personal goals, as well as the tools and strategies needed to overcome common obstacles which may arise along the way.  From Head to Heart: The Power of a Healthy Outlook  Clinical staff led group instruction and group discussion with PowerPoint presentation and patient guidebook. To enhance the learning environment the use of posters, models and videos may be added. Patients will be  able to recognize and describe the impact of emotions and mood on physical health. They will discover the importance of self-care and explore self-care practices which may work for them. Patients will also learn how to utilize the 4 C's to cultivate a healthier outlook and better manage stress and challenges. The purpose of this lesson is to demonstrate to patients how a healthy outlook is an essential part of maintaining good health, especially as they continue their cardiac rehab journey.  Healthy Sleep for a Healthy Heart Clinical staff led group instruction and group discussion with PowerPoint presentation and patient guidebook. To enhance the learning environment the use of posters, models and videos may be added. At the conclusion of this workshop, patients will be able to demonstrate knowledge of the importance of sleep to overall health, well-being, and quality of life. They will understand the symptoms of, and treatments for, common sleep disorders. Patients will also be able to identify daytime and nighttime behaviors which impact sleep, and they will be able to apply these tools to help manage sleep-related  challenges. The purpose of this lesson is to provide patients with a general overview of sleep and outline the importance of quality sleep. Patients will learn about a few of the most common sleep disorders. Patients will also be introduced to the concept of "sleep hygiene," and discover ways to self-manage certain sleeping problems through simple daily behavior changes. Finally, the workshop will motivate patients by clarifying the links between quality sleep and their goals of heart-healthy living.   Recognizing and Reducing Stress Clinical staff led group instruction and group discussion with PowerPoint presentation and patient guidebook. To enhance the learning environment the use of posters, models and videos may be added. At the conclusion of this workshop, patients will be able to understand the types of stress reactions, differentiate between acute and chronic stress, and recognize the impact that chronic stress has on their health. They will also be able to apply different coping mechanisms, such as reframing negative self-talk. Patients will have the opportunity to practice a variety of stress management techniques, such as deep abdominal breathing, progressive muscle relaxation, and/or guided imagery.  The purpose of this lesson is to educate patients on the role of stress in their lives and to provide healthy techniques for coping with it.  Learning Barriers/Preferences:  Learning Barriers/Preferences - 01/11/24 1431       Learning Barriers/Preferences   Learning Barriers Sight   contacts   Learning Preferences Audio;Computer/Internet;Group Instruction;Individual Instruction;Pictoral;Skilled Demonstration;Verbal Instruction;Video;Written Material             Education Topics:  Knowledge Questionnaire Score:  Knowledge Questionnaire Score - 01/11/24 1432       Knowledge Questionnaire Score   Pre Score 24/24             Core Components/Risk Factors/Patient Goals at  Admission:  Personal Goals and Risk Factors at Admission - 01/11/24 1431       Core Components/Risk Factors/Patient Goals on Admission    Weight Management Yes;Weight Maintenance    Intervention Weight Management: Develop a combined nutrition and exercise program designed to reach desired caloric intake, while maintaining appropriate intake of nutrient and fiber, sodium and fats, and appropriate energy expenditure required for the weight goal.;Weight Management: Provide education and appropriate resources to help participant work on and attain dietary goals.    Expected Outcomes Short Term: Continue to assess and modify interventions until short term weight is achieved;Long Term: Adherence to nutrition and physical activity/exercise program  aimed toward attainment of established weight goal;Weight Maintenance: Understanding of the daily nutrition guidelines, which includes 25-35% calories from fat, 7% or less cal from saturated fats, less than 200mg  cholesterol, less than 1.5gm of sodium, & 5 or more servings of fruits and vegetables daily;Understanding recommendations for meals to include 15-35% energy as protein, 25-35% energy from fat, 35-60% energy from carbohydrates, less than 200mg  of dietary cholesterol, 20-35 gm of total fiber daily;Understanding of distribution of calorie intake throughout the day with the consumption of 4-5 meals/snacks    Hypertension Yes    Intervention Provide education on lifestyle modifcations including regular physical activity/exercise, weight management, moderate sodium restriction and increased consumption of fresh fruit, vegetables, and low fat dairy, alcohol moderation, and smoking cessation.;Monitor prescription use compliance.    Expected Outcomes Short Term: Continued assessment and intervention until BP is < 140/27mm HG in hypertensive participants. < 130/15mm HG in hypertensive participants with diabetes, heart failure or chronic kidney disease.;Long Term:  Maintenance of blood pressure at goal levels.             Core Components/Risk Factors/Patient Goals Review:    Core Components/Risk Factors/Patient Goals at Discharge (Final Review):    ITP Comments:  ITP Comments     Row Name 01/11/24 1411           ITP Comments Dr. Gaylyn Keas medical director. Introduction to [facility] education/intensive cardiac rehab. Initial orientation packet reviewed with patient.                Comments: Participant attended orientation for the cardiac rehabilitation program on  01/11/2024  to perform initial intake and exercise walk test. Patient introduced to the [facility] education and orientation packet was reviewed. Completed 6-minute walk test, measurements, initial ITP, and exercise prescription. Vital signs stable. Telemetry-normal sinus rhythm, asymptomatic. Pt with SOB post , RPD = 1, resolved with rest.    Service time was from 1323 to 1513.    Arlander Labrum, MS, ACSM-CEP 01/11/2024 3:38 PM

## 2024-01-16 ENCOUNTER — Encounter (HOSPITAL_COMMUNITY)
Admission: RE | Admit: 2024-01-16 | Discharge: 2024-01-16 | Disposition: A | Discharge status: Home / Self Care | Source: Ambulatory Visit | Attending: Cardiology | Admitting: Cardiology

## 2024-01-16 DIAGNOSIS — Z952 Presence of prosthetic heart valve: Secondary | ICD-10-CM | POA: Diagnosis present

## 2024-01-16 NOTE — Progress Notes (Signed)
 Daily Session Note  Patient Details  Name: Melinda Lane MRN: [id number] Date of Birth: 1951-09-08 Referring Provider:   Flowsheet Row INTENSIVE CARDIAC REHAB ORIENT from 01/11/2024 in [facility]  Referring Provider Gaylyn Keas, MD       Encounter Date: 01/16/2024  Check In:  Session Check In - 01/16/24 1600       Check-In   Supervising physician immediately available to respond to emergencies [facility] MD immediately available    Physician(s) Slater Duncan, NP    Location [facility]    Staff Present Joann Mu, RN, Malvin Searing, MS, ACSM-CEP, CCRP, Exercise Physiologist;Olinty Gaylene Kays, MS, ACSM-CEP, Exercise Physiologist;Casey Ena Harries, MS, Exercise Physiologist;Mary Arlester Ladd, RN, BSN    Virtual Visit No    Medication changes reported     No    Fall or balance concerns reported    No    Tobacco Cessation No Change    Warm-up and Cool-down Performed as group-led instruction   CRP2 orientation   Resistance Training Performed Yes    VAD Patient? No    PAD/SET Patient? No      Pain Assessment   Currently in Pain? No/denies    Pain Score 0-No pain    Multiple Pain Sites No             Capillary Blood Glucose: No results found for this or any previous visit (from the past 24 hours).    Social History   Tobacco Use  Smoking Status Never  Smokeless Tobacco Never    Goals Met:  Exercise tolerated well No report of concerns or symptoms today Strength training completed today  Goals Unmet:  Not Applicable  Comments: Pt started cardiac rehab today.  Pt tolerated light exercise without difficulty. VSS, telemetry-Sinus rhythm, asymptomatic.  Medication list reconciled. Pt denies barriers to medicaiton compliance.  PSYCHOSOCIAL ASSESSMENT:  PHQ-2. Pt exhibits positive coping skills, hopeful outlook with supportive family. No psychosocial needs identified at this time, no  psychosocial interventions necessary.    Pt enjoys golf, spending time on her pontoon boat, spending time with her children and grandchildren.   Pt oriented to exercise equipment and routine.    Understanding verbalized.[name] RN BSN    Dr. Gaylyn Keas is Medical Director for Cardiac Rehab at [facility].

## 2024-01-18 ENCOUNTER — Encounter (HOSPITAL_COMMUNITY)
Admission: RE | Admit: 2024-01-18 | Discharge: 2024-01-18 | Disposition: A | Discharge status: Home / Self Care | Source: Ambulatory Visit | Attending: Cardiology | Admitting: Cardiology

## 2024-01-18 DIAGNOSIS — Z952 Presence of prosthetic heart valve: Secondary | ICD-10-CM | POA: Diagnosis not present

## 2024-01-20 ENCOUNTER — Encounter (HOSPITAL_COMMUNITY)
Admission: RE | Admit: 2024-01-20 | Discharge: 2024-01-20 | Disposition: A | Discharge status: Home / Self Care | Source: Ambulatory Visit | Attending: Cardiology | Admitting: Cardiology

## 2024-01-20 DIAGNOSIS — Z952 Presence of prosthetic heart valve: Secondary | ICD-10-CM

## 2024-01-23 ENCOUNTER — Encounter (HOSPITAL_COMMUNITY)
Admission: RE | Admit: 2024-01-23 | Discharge: 2024-01-23 | Disposition: A | Discharge status: Home / Self Care | Source: Ambulatory Visit | Attending: Cardiology | Admitting: Cardiology

## 2024-01-23 DIAGNOSIS — Z952 Presence of prosthetic heart valve: Secondary | ICD-10-CM | POA: Diagnosis not present

## 2024-01-24 NOTE — Progress Notes (Signed)
 Patient here for second week of exercise at cardiac rehab. Intermittent resting and exertional BP elevations noted at cardiac rehab. Medications reviewed. Taking as prescribed. Will notify patient's cardiologist APP, Jeremy Monk [facility]  at [facility] about her BP's and continue to monitor the patient. Spoke with Joann RN at [facility] and given yesterday's BP readings. Joann said that she will given the BP readings to Ms Ruoff and will advise the patient as necessary.[name] RN BSN

## 2024-01-25 ENCOUNTER — Encounter (HOSPITAL_COMMUNITY)
Admission: RE | Admit: 2024-01-25 | Discharge: 2024-01-25 | Disposition: A | Discharge status: Home / Self Care | Source: Ambulatory Visit | Attending: Cardiology | Admitting: Cardiology

## 2024-01-25 DIAGNOSIS — Z952 Presence of prosthetic heart valve: Secondary | ICD-10-CM | POA: Diagnosis not present

## 2024-01-27 ENCOUNTER — Encounter (HOSPITAL_COMMUNITY)
Admission: RE | Admit: 2024-01-27 | Discharge: 2024-01-27 | Disposition: A | Discharge status: Home / Self Care | Source: Ambulatory Visit | Attending: Cardiology | Admitting: Cardiology

## 2024-01-27 DIAGNOSIS — Z952 Presence of prosthetic heart valve: Secondary | ICD-10-CM

## 2024-01-30 ENCOUNTER — Encounter (HOSPITAL_COMMUNITY)

## 2024-02-01 ENCOUNTER — Encounter (HOSPITAL_COMMUNITY)

## 2024-02-01 NOTE — Progress Notes (Signed)
 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 (Patient taking differently: Take 5 mg by mouth 2 (two) times daily.), Disp: 30 tablet, Rfl: 0   amLODipine  (NORVASC ) 5 MG tablet, Take by mouth 2 (two) times daily. (Patient not taking: Reported on 01/11/2024), Disp: , Rfl:    amoxicillin (AMOXIL) 500 MG tablet, Take 500 mg by mouth as needed (dental appts)., Disp: , Rfl:    aspirin  buffered (BUFFERIN) 325 MG TABS tablet, Take 325 mg by mouth daily., Disp: , Rfl:    carbidopa -levodopa  (SINEMET  IR) 25-100 MG tablet, Take 1.5 tablets by mouth 4 (four) times daily., Disp: 540 tablet, Rfl: 1   celecoxib (CELEBREX) 200 MG capsule, Take 200 mg by mouth daily., Disp: , Rfl:    cholecalciferol  (VITAMIN D) 1000 UNITS tablet, Take 1,000 Units by mouth daily., Disp: , Rfl:    dicyclomine (BENTYL) 20 MG tablet, Take 20 mg by mouth 4 (four) times daily., Disp: , Rfl:    docusate sodium (COLACE) 100 MG capsule, Take 100 mg by mouth 2 (two) times daily., Disp: , Rfl:    ferrous sulfate 325 (65 FE) MG tablet, Take 325 mg by mouth daily with breakfast., Disp: , Rfl:    guaiFENesin (MUCINEX) 600 MG 12 hr tablet, Take 600 mg by mouth 2 (two) times daily as needed for cough., Disp: , Rfl:    HYDROcodone -acetaminophen  (NORCO) 10-325 MG tablet, Take 1 tablet by mouth every 8 (eight)  hours as needed. 1/2 tablet 3x daily (Patient not taking: Reported on 01/11/2024), Disp: , Rfl:    ibuprofen  (ADVIL ) 800 MG tablet, Take 800 mg by mouth 3 (three) times daily., Disp: , Rfl:    MAGNESIUM PO, Take 200 mg by mouth daily., Disp: , Rfl:    mesalamine  (PENTASA ) 500 MG CR capsule, Take 1,000 mg by mouth 2 (two) times daily.  (Patient not taking: Reported on 11/10/2023), Disp: , Rfl:    methocarbamol (ROBAXIN) 500 MG tablet, Take 1 tablet every 6-8 hours by oral route as needed for spasm for 10 days., Disp: , Rfl:    metoprolol  succinate (TOPROL -XL) 25 MG 24 hr tablet, Take 25 mg by mouth daily., Disp: , Rfl:    Multiple Vitamin (MULTIVITAMIN) tablet, Take 1 tablet by mouth daily., Disp: , Rfl:    NONFORMULARY OR COMPOUNDED ITEM, Apply 4-6 tablets topically every 4 (four) months. Estrogen and Testosterone pellets put under the skin, Disp: , Rfl:    NP THYROID  90 MG tablet, Take 60 mg by mouth every morning., Disp: , Rfl:    Omega-3 Fatty Acids (FISH OIL) 1000 MG CAPS, Take 1,000 mg by mouth daily., Disp: , Rfl:    pramipexole (MIRAPEX) 0.25 MG tablet, Take 0.5 mg by mouth daily., Disp: , Rfl:    progesterone  (PROMETRIUM ) 200 MG capsule, Take 300 mg by mouth daily. Compound Rx, Disp: , Rfl:  progesterone  (PROMETRIUM ) 200 MG capsule, Take 200 mg by mouth daily. (Patient not taking: Reported on 01/11/2024), Disp: , Rfl:    SKYRIZI 360 MG/2.4ML SOCT, Inject into the skin., Disp: , Rfl:    spironolactone (ALDACTONE) 25 MG tablet, Take 25 mg by mouth daily. (Patient not taking: Reported on 01/11/2024), Disp: , Rfl:    telmisartan (MICARDIS) 80 MG tablet, Take 80 mg by mouth daily. 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 right   Cephalalgia 11/15/2014   Chest pain 04/12/2012   Crohn's disease of ileum without complication (HCC) 03/30/2016   Elevated troponin    Esophageal dysphagia 01/13/2017   HTN (hypertension) 04/12/2012   Hyperlipemia 04/12/2012   Hyperlipidemia 04/12/2012   Hypothyroidism    IBS (irritable bowel syndrome)    Incontinence in female 11/12/2014   Left knee pain 10/14/2011   Parkinson's disease (HCC) 07/24/2019   Pes anserine bursitis 10/14/2011   Primary osteoarthritis involving multiple joints 11/25/2017   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(HCC)    RLS (restless legs syndrome) 10/10/2019   Thyroid  nodule 04/12/2012   Tremor 10/10/2019    Tobacco Use: Social History   Tobacco Use  Smoking Status Never  Smokeless Tobacco Never    Labs: Review Flowsheet       Latest Ref Rng & Units 04/12/2012  Labs for ITP Cardiac and Pulmonary Rehab  Cholestrol 0 - 200 mg/dL 914   LDL (calc) 0 - 99 mg/dL 95   HDL-C >78 mg/dL 69   Trlycerides <295 mg/dL 40     Capillary Blood Glucose: No results found for: "GLUCAP"   Exercise Target Goals: Exercise Program Goal: Individual exercise prescription set using results from initial 6 min walk test and THRR while considering  patient's activity barriers and safety.   Exercise Prescription Goal: Initial exercise prescription builds to 30-45 minutes a day of aerobic activity, 2-3 days per week.  Home exercise guidelines will be given to patient during program as part of exercise prescription that the participant will acknowledge.  Activity Barriers & Risk Stratification:   Activity Barriers & Cardiac Risk Stratification - 01/11/24 1430       Activity Barriers & Cardiac Risk Stratification   Activity Barriers Right Knee Replacement;Balance Concerns;Shortness of Breath;Joint Problems    Cardiac Risk Stratification High   <5 METs on            6 Minute Walk:  6 Minute Walk     Row Name 01/11/24 1534         6 Minute Walk   Phase Initial     Distance 1410 feet     Walk Time 6 minutes     # of Rest Breaks 0     MPH 2.67     METS 3.5     RPE 9     Perceived Dyspnea  1  VO2 Peak 12.25     Symptoms Yes (comment)     Comments SOB, resolved with rest     Resting HR 66 bpm     Resting BP 132/72     Resting Oxygen Saturation  98 %     Exercise Oxygen Saturation  during 6 min walk 98 %     Max Ex. HR 102 bpm     Max Ex. BP 164/88     2 Minute Post BP 142/78              Oxygen Initial Assessment:   Oxygen Re-Evaluation:   Oxygen Discharge (Final Oxygen Re-Evaluation):   Initial Exercise Prescription:  Initial Exercise Prescription - 01/11/24 1500       Date of Initial Exercise RX and Referring Provider   Date 01/11/24    Referring Provider Gaylyn Keas, MD    Expected Discharge Date 04/18/24      NuStep   Level 1    SPM 70    Minutes 15    METs 2      Track   Laps 16    Minutes 15    METs 2.5      Prescription Details   Frequency (times per week) 3    Duration Progress to 30 minutes of continuous aerobic without signs/symptoms of physical distress      Intensity   THRR 40-80% of Max Heartrate 60-120    Ratings of Perceived Exertion 11-13    Perceived Dyspnea 0-4      Progression   Progression Continue progressive overload as per policy without signs/symptoms or physical distress.      Resistance Training   Training Prescription Yes    Weight 3    Reps 10-15             Perform Capillary Blood Glucose checks as needed.  Exercise Prescription Changes:   Exercise Prescription Changes      Row Name 01/16/24 1503             Response to Exercise   Blood Pressure (Admit) 132/70       Blood Pressure (Exercise) 148/70       Blood Pressure (Exit) 133/84       Heart Rate (Admit) 65 bpm       Heart Rate (Exercise) 102 bpm       Heart Rate (Exit) 75 bpm       Rating of Perceived Exertion (Exercise) 9       Symptoms None       Comments Off to a great start with exercise.       Duration Continue with 30 min of aerobic exercise without signs/symptoms of physical distress.       Intensity THRR unchanged         Progression   Progression Continue to progress workloads to maintain intensity without signs/symptoms of physical distress.       Average METs 3.4         Resistance Training   Training Prescription Yes       Weight 3 lbs       Reps 10-15       Time 10 Minutes         Interval Training   Interval Training No         Recumbant Elliptical   Level 1       Watts 69       Minutes 15       METs  4         Track   Laps 15       Minutes 15       METs 2.91                Exercise Comments:   Exercise Comments     Row Name 01/16/24 1634           Exercise Comments Chari tolerated first session of exercise well without symptoms. Oriented to the exercise equipment and stretching routine.                Exercise Goals and Review:   Exercise Goals     Row Name 01/11/24 1430             Exercise Goals   Increase Physical Activity Yes       Intervention Provide advice, education, support and counseling about physical activity/exercise needs.;Develop an individualized exercise prescription for aerobic and resistive training based on initial evaluation findings, risk stratification, comorbidities and participant's personal goals.       Expected Outcomes Short Term: Attend rehab on a regular basis to increase amount of physical activity.;Long Term: Exercising regularly at least 3-5 days a week.;Long Term: Add in home exercise to make exercise part  of routine and to increase amount of physical activity.       Increase Strength and Stamina Yes       Intervention Provide advice, education, support and counseling about physical activity/exercise needs.;Develop an individualized exercise prescription for aerobic and resistive training based on initial evaluation findings, risk stratification, comorbidities and participant's personal goals.       Expected Outcomes Short Term: Increase workloads from initial exercise prescription for resistance, speed, and METs.;Short Term: Perform resistance training exercises routinely during rehab and add in resistance training at home;Long Term: Improve cardiorespiratory fitness, muscular endurance and strength as measured by increased METs and functional capacity ( )       Able to understand and use rate of perceived exertion (RPE) scale Yes       Intervention Provide education and explanation on how to use RPE scale       Expected Outcomes Short Term: Able to use RPE daily in rehab to express subjective intensity level;Long Term:  Able to use RPE to guide intensity level when exercising independently       Knowledge and understanding of Target Heart Rate Range (THRR) Yes       Intervention Provide education and explanation of THRR including how the numbers were predicted and where they are located for reference       Expected Outcomes Short Term: Able to state/look up THRR;Short Term: Able to use daily as guideline for intensity in rehab;Long Term: Able to use THRR to govern intensity when exercising independently       Understanding of Exercise Prescription Yes       Intervention Provide education, explanation, and written materials on patient's individual exercise prescription       Expected Outcomes Short Term: Able to explain program exercise prescription;Long Term: Able to explain home exercise prescription to exercise independently                Exercise Goals Re-Evaluation :  Exercise Goals  Re-Evaluation     Row Name 01/16/24 1634             Exercise Goal Re-Evaluation   Exercise Goals Review Increase Physical Activity;Increase Strength and Stamina;Able to understand and use rate of perceived exertion (RPE)  scale       Comments Corri was able to understand and use RPE scale appropriately.       Expected Outcomes Progress workloads as tolerated to help improve cardiorespiratory fitness.                Discharge Exercise Prescription (Final Exercise Prescription Changes):  Exercise Prescription Changes - 01/16/24 1503       Response to Exercise   Blood Pressure (Admit) 132/70    Blood Pressure (Exercise) 148/70    Blood Pressure (Exit) 133/84    Heart Rate (Admit) 65 bpm    Heart Rate (Exercise) 102 bpm    Heart Rate (Exit) 75 bpm    Rating of Perceived Exertion (Exercise) 9    Symptoms None    Comments Off to a great start with exercise.    Duration Continue with 30 min of aerobic exercise without signs/symptoms of physical distress.    Intensity THRR unchanged      Progression   Progression Continue to progress workloads to maintain intensity without signs/symptoms of physical distress.    Average METs 3.4      Resistance Training   Training Prescription Yes    Weight 3 lbs    Reps 10-15    Time 10 Minutes      Interval Training   Interval Training No      Recumbant Elliptical   Level 1    Watts 69    Minutes 15    METs 4      Track   Laps 15    Minutes 15    METs 2.91             Nutrition:  Target Goals: Understanding of nutrition guidelines, daily intake of sodium 1500mg , cholesterol 200mg , calories 30% from fat and 7% or less from saturated fats, daily to have 5 or more servings of fruits and vegetables.  Biometrics:  Pre Biometrics - 01/11/24 1419       Pre Biometrics   Waist Circumference 32 inches    Hip Circumference 36 inches    Waist to Hip Ratio 0.89 %    Triceps Skinfold 25 mm    % Body Fat 35.5 %    Grip  Strength 30 kg    Flexibility 11.5 in    Single Leg Stand 4.62 seconds              Nutrition Therapy Plan and Nutrition Goals:  Nutrition Therapy & Goals - 01/16/24 1544       Nutrition Therapy   Diet Heart Healthy Diet      Personal Nutrition Goals   Nutrition Goal Patient to identify strategies for reducing cardiovascular risk by attending the [employer] education and nutrition series weekly.    Personal Goal #2 Patient to improve diet quality by using the plate method as a guide for meal planning to include lean protein/plant protein, fruits, vegetables, whole grains, nonfat dairy as part of a well-balanced diet.    Comments Patient has medical history of crohn's disease, parkinson's, s/p AVR, renovascular HTN, hyperlipidemia. Lipids and A1c are WNL. Patient will benefit from participation in intensive cardiac rehab for nutrition, exercise, and lifestyle modification.      Intervention Plan   Intervention Prescribe, educate and counsel regarding individualized specific dietary modifications aiming towards targeted core components such as weight, hypertension, lipid management, diabetes, heart failure and other comorbidities.;Nutrition handout(s) given to patient.    Expected Outcomes Short Term Goal: Understand basic principles of  dietary content, such as calories, fat, sodium, cholesterol and nutrients.;Long Term Goal: Adherence to prescribed nutrition plan.             Nutrition Assessments:  Nutrition Assessments - 01/19/24 1526       Rate Your Plate Scores   Pre Score 59            MEDIFICTS Score Key: >=70 Need to make dietary changes  40-70 Heart Healthy Diet <= 40 Therapeutic Level Cholesterol Diet   Flowsheet Row INTENSIVE CARDIAC REHAB from 01/18/2024 in [facility], V[facility] & Lung [facility]  Picture Your Plate Total Score on Admission 59      Picture Your Plate Scores: <09 Unhealthy dietary pattern with much room for  improvement. 41-50 Dietary pattern unlikely to meet recommendations for good health and room for improvement. 51-60 More healthful dietary pattern, with some room for improvement.  >60 Healthy dietary pattern, although there may be some specific behaviors that could be improved.    Nutrition Goals Re-Evaluation:  Nutrition Goals Re-Evaluation     Row Name 01/16/24 1544             Goals   Current Weight 142 lb 3.2 oz (64.5 kg)       Comment BMP WNL, lipids WNL, LDL 82, HDL 93, A1c WNL       Expected Outcome Patient has medical history of crohn's disease, parkinson's, s/p AVR, renovascular HTN, hyperlipidemia. Lipids and A1c are WNL. Patient will benefit from participation in intensive cardiac rehab for nutrition, exercise, and lifestyle modification.                Nutrition Goals Re-Evaluation:  Nutrition Goals Re-Evaluation     Row Name 01/16/24 1544             Goals   Current Weight 142 lb 3.2 oz (64.5 kg)       Comment BMP WNL, lipids WNL, LDL 82, HDL 93, A1c WNL       Expected Outcome Patient has medical history of crohn's disease, parkinson's, s/p AVR, renovascular HTN, hyperlipidemia. Lipids and A1c are WNL. Patient will benefit from participation in intensive cardiac rehab for nutrition, exercise, and lifestyle modification.                Nutrition Goals Discharge (Final Nutrition Goals Re-Evaluation):  Nutrition Goals Re-Evaluation - 01/16/24 1544       Goals   Current Weight 142 lb 3.2 oz (64.5 kg)    Comment BMP WNL, lipids WNL, LDL 82, HDL 93, A1c WNL    Expected Outcome Patient has medical history of crohn's disease, parkinson's, s/p AVR, renovascular HTN, hyperlipidemia. Lipids and A1c are WNL. Patient will benefit from participation in intensive cardiac rehab for nutrition, exercise, and lifestyle modification.             Psychosocial: Target Goals: Acknowledge presence or absence of significant depression and/or stress, maximize coping  skills, provide positive support system. Participant is able to verbalize types and ability to use techniques and skills needed for reducing stress and depression.  Initial Review & Psychosocial Screening:  Initial Psych Review & Screening - 01/11/24 1431       Initial Review   Current issues with None Identified      Family Dynamics   Good Support System? Yes   husband and very close family     Barriers   Psychosocial barriers to participate in program There are no identifiable barriers or  psychosocial needs.      Screening Interventions   Interventions Encouraged to exercise;Provide feedback about the scores to participant    Expected Outcomes Short Term goal: Identification and review with participant of any Quality of Life or Depression concerns found by scoring the questionnaire.;Long Term goal: The participant improves quality of Life and PHQ9 Scores as seen by post scores and/or verbalization of changes             Quality of Life Scores:  Quality of Life - 01/11/24 1537       Quality of Life   Select Quality of Life      Quality of Life Scores   Health/Function Pre 27.6 %    Socioeconomic Pre 30 %    Psych/Spiritual Pre 30 %    Family Pre 30 %    GLOBAL Pre 28.94 %            Scores of 19 and below usually indicate a poorer quality of life in these areas.  A difference of  2-3 points is a clinically meaningful difference.  A difference of 2-3 points in the total score of the Quality of Life Index has been associated with significant improvement in overall quality of life, self-image, physical symptoms, and general health in studies assessing change in quality of life.  PHQ-9: Review Flowsheet       01/11/2024  Depression screen PHQ 2/9  Decreased Interest 0  Down, Depressed, Hopeless 0  PHQ - 2 Score 0  Altered sleeping 1  Tired, decreased energy 1  Change in appetite 0  Feeling bad or failure about yourself  0  Trouble concentrating 0  Moving slowly  or fidgety/restless 0  Suicidal thoughts 0  PHQ-9 Score 2  Difficult doing work/chores Somewhat difficult   Interpretation of Total Score  Total Score Depression Severity:  1-4 = Minimal depression, 5-9 = Mild depression, 10-14 = Moderate depression, 15-19 = Moderately severe depression, 20-27 = Severe depression   Psychosocial Evaluation and Intervention:   Psychosocial Re-Evaluation:  Psychosocial Re-Evaluation     Row Name 01/16/24 1637 01/24/24 1518 02/01/24 1211         Psychosocial Re-Evaluation   Current issues with None Identified None Identified None Identified     Comments -- Discussed PHQ2-9. Jaine says that her energy level has improved since she has been particpating in cardiac rehab --     Interventions Encouraged to attend Cardiac Rehabilitation for the exercise Encouraged to attend Cardiac Rehabilitation for the exercise Encouraged to attend Cardiac Rehabilitation for the exercise     Continue Psychosocial Services  No Follow up required No Follow up required No Follow up required              Psychosocial Discharge (Final Psychosocial Re-Evaluation):  Psychosocial Re-Evaluation - 02/01/24 1211       Psychosocial Re-Evaluation   Current issues with None Identified    Interventions Encouraged to attend Cardiac Rehabilitation for the exercise    Continue Psychosocial Services  No Follow up required             Vocational Rehabilitation: Provide vocational rehab assistance to qualifying candidates.   Vocational Rehab Evaluation & Intervention:  Vocational Rehab - 01/11/24 1431       Initial Vocational Rehab Evaluation & Intervention   Assessment shows need for Vocational Rehabilitation No   Inetta is working            Education: Education Goals: Education classes will be provided  on a weekly basis, covering required topics. Participant will state understanding/return demonstration of topics presented.    Education     Row Name 01/16/24  1600     Education   Cardiac Education Topics Pritikin   Nurse, children's Exercise Physiologist   Select Psychosocial   Psychosocial Healthy Minds, Bodies, Hearts   Instruction Review Code 1- Verbalizes Understanding   Class Start Time 1410   Class Stop Time 1445   Class Time Calculation (min) 35 min    Row Name 01/18/24 1500     Education   Cardiac Education Topics Pritikin   Customer service manager   Weekly Topic Adding Flavor - Sodium-Free   Instruction Review Code 1- Verbalizes Understanding   Class Start Time 1400   Class Stop Time 1440   Class Time Calculation (min) 40 min    Row Name 01/20/24 1500     Education   Cardiac Education Topics Pritikin   Select Workshops     Workshops   Educator Exercise Physiologist   Select Exercise   Exercise Workshop Location manager and Fall Prevention   Instruction Review Code 1- Verbalizes Understanding   Class Start Time 1407   Class Stop Time 1447   Class Time Calculation (min) 40 min    Row Name 01/23/24 1500     Education   Cardiac Education Topics Pritikin   Glass blower/designer Nutrition   Nutrition Workshop Label Reading   Instruction Review Code 1- Tax inspector   Class Start Time 1400   Class Stop Time 1437   Class Time Calculation (min) 37 min    Row Name 01/25/24 1400     Education   Cardiac Education Topics Pritikin   Nurse, children's Exercise Physiologist   Select Nutrition   Nutrition Other  Label reading   Instruction Review Code 1- Verbalizes Understanding   Class Start Time 1350   Class Stop Time 1435   Class Time Calculation (min) 45 min    Row Name 01/27/24 1500     Education   Cardiac Education Topics Pritikin   Customer service manager   Weekly Topic Fast and Healthy Breakfasts   Instruction  Review Code 1- Verbalizes Understanding   Class Start Time 1400   Class Stop Time 1440   Class Time Calculation (min) 40 min            Core Videos: Exercise    Move It!  Clinical staff conducted group or individual video education with verbal and written material and guidebook.  Patient learns the recommended Pritikin exercise program. Exercise with the goal of living a long, healthy life. Some of the health benefits of exercise include controlled diabetes, healthier blood pressure levels, improved cholesterol levels, improved heart and lung capacity, improved sleep, and better body composition. Everyone should speak with their doctor before starting or changing an exercise routine.  Biomechanical Limitations Clinical staff conducted group or individual video education with verbal and written material and guidebook.  Patient learns how biomechanical limitations can impact exercise and how we can mitigate and possibly overcome limitations to have an impactful and balanced exercise routine.  Body Composition Clinical staff conducted group or individual video education with verbal and  written material and guidebook.  Patient learns that body composition (ratio of muscle mass to fat mass) is a key component to assessing overall fitness, rather than body weight alone. Increased fat mass, especially visceral belly fat, can put us  at increased risk for metabolic syndrome, type 2 diabetes, heart disease, and even death. It is recommended to combine diet and exercise (cardiovascular and resistance training) to improve your body composition. Seek guidance from your physician and exercise physiologist before implementing an exercise routine.  Exercise Action Plan Clinical staff conducted group or individual video education with verbal and written material and guidebook.  Patient learns the recommended strategies to achieve and enjoy long-term exercise adherence, including variety, self-motivation,  self-efficacy, and positive decision making. Benefits of exercise include fitness, good health, weight management, more energy, better sleep, less stress, and overall well-being.  Medical   Heart Disease Risk Reduction Clinical staff conducted group or individual video education with verbal and written material and guidebook.  Patient learns our heart is our most vital organ as it circulates oxygen, nutrients, white blood cells, and hormones throughout the entire body, and carries waste away. Data supports a plant-based eating plan like the Pritikin Program for its effectiveness in slowing progression of and reversing heart disease. The video provides a number of recommendations to address heart disease.   Metabolic Syndrome and Belly Fat  Clinical staff conducted group or individual video education with verbal and written material and guidebook.  Patient learns what metabolic syndrome is, how it leads to heart disease, and how one can reverse it and keep it from coming back. You have metabolic syndrome if you have 3 of the following 5 criteria: abdominal obesity, high blood pressure, high triglycerides, low HDL cholesterol, and high blood sugar.  Hypertension and Heart Disease Clinical staff conducted group or individual video education with verbal and written material and guidebook.  Patient learns that high blood pressure, or hypertension, is very common in the [place] . Hypertension is largely due to excessive salt intake, but other important risk factors include being overweight, physical inactivity, drinking too much alcohol, smoking, and not eating enough potassium from fruits and vegetables. High blood pressure is a leading risk factor for heart attack, stroke, congestive heart failure, dementia, kidney failure, and premature death. Long-term effects of excessive salt intake include stiffening of the arteries and thickening of heart muscle and organ damage. Recommendations include ways to  reduce hypertension and the risk of heart disease.  Diseases of Our Time - Focusing on Diabetes Clinical staff conducted group or individual video education with verbal and written material and guidebook.  Patient learns why the best way to stop diseases of our time is prevention, through food and other lifestyle changes. Medicine (such as prescription pills and surgeries) is often only a Band-Aid on the problem, not a long-term solution. Most common diseases of our time include obesity, type 2 diabetes, hypertension, heart disease, and cancer. The Pritikin Program is recommended and has been proven to help reduce, reverse, and/or prevent the damaging effects of metabolic syndrome.  Nutrition   Overview of the Pritikin Eating Plan  Clinical staff conducted group or individual video education with verbal and written material and guidebook.  Patient learns about the Pritikin Eating Plan for disease risk reduction. The Pritikin Eating Plan emphasizes a wide variety of unrefined, minimally-processed carbohydrates, like fruits, vegetables, whole grains, and legumes. Go, Caution, and Stop food choices are explained. Plant-based and lean animal proteins are emphasized. Rationale provided for low sodium  intake for blood pressure control, low added sugars for blood sugar stabilization, and low added fats and oils for coronary artery disease risk reduction and weight management.  Calorie Density  Clinical staff conducted group or individual video education with verbal and written material and guidebook.  Patient learns about calorie density and how it impacts the Pritikin Eating Plan. Knowing the characteristics of the food you choose will help you decide whether those foods will lead to weight gain or weight loss, and whether you want to consume more or less of them. Weight loss is usually a side effect of the Pritikin Eating Plan because of its focus on low calorie-dense foods.  Label Reading  Clinical staff  conducted group or individual video education with verbal and written material and guidebook.  Patient learns about the Pritikin recommended label reading guidelines and corresponding recommendations regarding calorie density, added sugars, sodium content, and whole grains.  Dining Out - Part 1  Clinical staff conducted group or individual video education with verbal and written material and guidebook.  Patient learns that restaurant meals can be sabotaging because they can be so high in calories, fat, sodium, and/or sugar. Patient learns recommended strategies on how to positively address this and avoid unhealthy pitfalls.  Facts on Fats  Clinical staff conducted group or individual video education with verbal and written material and guidebook.  Patient learns that lifestyle modifications can be just as effective, if not more so, as many medications for lowering your risk of heart disease. A Pritikin lifestyle can help to reduce your risk of inflammation and atherosclerosis (cholesterol build-up, or plaque, in the artery walls). Lifestyle interventions such as dietary choices and physical activity address the cause of atherosclerosis. A review of the types of fats and their impact on blood cholesterol levels, along with dietary recommendations to reduce fat intake is also included.  Nutrition Action Plan  Clinical staff conducted group or individual video education with verbal and written material and guidebook.  Patient learns how to incorporate Pritikin recommendations into their lifestyle. Recommendations include planning and keeping personal health goals in mind as an important part of their success.  Healthy Mind-Set    Healthy Minds, Bodies, Hearts  Clinical staff conducted group or individual video education with verbal and written material and guidebook.  Patient learns how to identify when they are stressed. Video will discuss the impact of that stress, as well as the many benefits of  stress management. Patient will also be introduced to stress management techniques. The way we think, act, and feel has an impact on our hearts.  How Our Thoughts Can Heal Our Hearts  Clinical staff conducted group or individual video education with verbal and written material and guidebook.  Patient learns that negative thoughts can cause depression and anxiety. This can result in negative lifestyle behavior and serious health problems. Cognitive behavioral therapy is an effective method to help control our thoughts in order to change and improve our emotional outlook.  Additional Videos:  Exercise    Improving Performance  Clinical staff conducted group or individual video education with verbal and written material and guidebook.  Patient learns to use a non-linear approach by alternating intensity levels and lengths of time spent exercising to help burn more calories and lose more body fat. Cardiovascular exercise helps improve heart health, metabolism, hormonal balance, blood sugar control, and recovery from fatigue. Resistance training improves strength, endurance, balance, coordination, reaction time, metabolism, and muscle mass. Flexibility exercise improves circulation, posture, and balance.  Seek guidance from your physician and exercise physiologist before implementing an exercise routine and learn your capabilities and proper form for all exercise.  Introduction to Yoga  Clinical staff conducted group or individual video education with verbal and written material and guidebook.  Patient learns about yoga, a discipline of the coming together of mind, breath, and body. The benefits of yoga include improved flexibility, improved range of motion, better posture and core strength, increased lung function, weight loss, and positive self-image. Yoga's heart health benefits include lowered blood pressure, healthier heart rate, decreased cholesterol and triglyceride levels, improved immune function,  and reduced stress. Seek guidance from your physician and exercise physiologist before implementing an exercise routine and learn your capabilities and proper form for all exercise.  Medical   Aging: Enhancing Your Quality of Life  Clinical staff conducted group or individual video education with verbal and written material and guidebook.  Patient learns key strategies and recommendations to stay in good physical health and enhance quality of life, such as prevention strategies, having an advocate, securing a Health Care Proxy and Power of Attorney, and keeping a list of medications and system for tracking them. It also discusses how to avoid risk for bone loss.  Biology of Weight Control  Clinical staff conducted group or individual video education with verbal and written material and guidebook.  Patient learns that weight gain occurs because we consume more calories than we burn (eating more, moving less). Even if your body weight is normal, you may have higher ratios of fat compared to muscle mass. Too much body fat puts you at increased risk for cardiovascular disease, heart attack, stroke, type 2 diabetes, and obesity-related cancers. In addition to exercise, following the Pritikin Eating Plan can help reduce your risk.  Decoding Lab Results  Clinical staff conducted group or individual video education with verbal and written material and guidebook.  Patient learns that lab test reflects one measurement whose values change over time and are influenced by many factors, including medication, stress, sleep, exercise, food, hydration, pre-existing medical conditions, and more. It is recommended to use the knowledge from this video to become more involved with your lab results and evaluate your numbers to speak with your doctor.   Diseases of Our Time - Overview  Clinical staff conducted group or individual video education with verbal and written material and guidebook.  Patient learns that  according to the CDC, 50% to 70% of chronic diseases (such as obesity, type 2 diabetes, elevated lipids, hypertension, and heart disease) are avoidable through lifestyle improvements including healthier food choices, listening to satiety cues, and increased physical activity.  Sleep Disorders Clinical staff conducted group or individual video education with verbal and written material and guidebook.  Patient learns how good quality and duration of sleep are important to overall health and well-being. Patient also learns about sleep disorders and how they impact health along with recommendations to address them, including discussing with a physician.  Nutrition  Dining Out - Part 2 Clinical staff conducted group or individual video education with verbal and written material and guidebook.  Patient learns how to plan ahead and communicate in order to maximize their dining experience in a healthy and nutritious manner. Included are recommended food choices based on the type of restaurant the patient is visiting.   Fueling a Banker conducted group or individual video education with verbal and written material and guidebook.  There is a strong connection between our food choices and  our health. Diseases like obesity and type 2 diabetes are very prevalent and are in large-part due to lifestyle choices. The Pritikin Eating Plan provides plenty of food and hunger-curbing satisfaction. It is easy to follow, affordable, and helps reduce health risks.  Menu Workshop  Clinical staff conducted group or individual video education with verbal and written material and guidebook.  Patient learns that restaurant meals can sabotage health goals because they are often packed with calories, fat, sodium, and sugar. Recommendations include strategies to plan ahead and to communicate with the manager, chef, or server to help order a healthier meal.  Planning Your Eating Strategy  Clinical staff  conducted group or individual video education with verbal and written material and guidebook.  Patient learns about the Pritikin Eating Plan and its benefit of reducing the risk of disease. The Pritikin Eating Plan does not focus on calories. Instead, it emphasizes high-quality, nutrient-rich foods. By knowing the characteristics of the foods, we choose, we can determine their calorie density and make informed decisions.  Targeting Your Nutrition Priorities  Clinical staff conducted group or individual video education with verbal and written material and guidebook.  Patient learns that lifestyle habits have a tremendous impact on disease risk and progression. This video provides eating and physical activity recommendations based on your personal health goals, such as reducing LDL cholesterol, losing weight, preventing or controlling type 2 diabetes, and reducing high blood pressure.  Vitamins and Minerals  Clinical staff conducted group or individual video education with verbal and written material and guidebook.  Patient learns different ways to obtain key vitamins and minerals, including through a recommended healthy diet. It is important to discuss all supplements you take with your doctor.   Healthy Mind-Set    Smoking Cessation  Clinical staff conducted group or individual video education with verbal and written material and guidebook.  Patient learns that cigarette smoking and tobacco addiction pose a serious health risk which affects millions of people. Stopping smoking will significantly reduce the risk of heart disease, lung disease, and many forms of cancer. Recommended strategies for quitting are covered, including working with your doctor to develop a successful plan.  Culinary   Becoming a Set designer conducted group or individual video education with verbal and written material and guidebook.  Patient learns that cooking at home can be healthy, cost-effective,  quick, and puts them in control. Keys to cooking healthy recipes will include looking at your recipe, assessing your equipment needs, planning ahead, making it simple, choosing cost-effective seasonal ingredients, and limiting the use of added fats, salts, and sugars.  Cooking - Breakfast and Snacks  Clinical staff conducted group or individual video education with verbal and written material and guidebook.  Patient learns how important breakfast is to satiety and nutrition through the entire day. Recommendations include key foods to eat during breakfast to help stabilize blood sugar levels and to prevent overeating at meals later in the day. Planning ahead is also a key component.  Cooking - Educational psychologist conducted group or individual video education with verbal and written material and guidebook.  Patient learns eating strategies to improve overall health, including an approach to cook more at home. Recommendations include thinking of animal protein as a side on your plate rather than center stage and focusing instead on lower calorie dense options like vegetables, fruits, whole grains, and plant-based proteins, such as beans. Making sauces in large quantities to freeze for later and leaving the  skin on your vegetables are also recommended to maximize your experience.  Cooking - Healthy Salads and Dressing Clinical staff conducted group or individual video education with verbal and written material and guidebook.  Patient learns that vegetables, fruits, whole grains, and legumes are the foundations of the Pritikin Eating Plan. Recommendations include how to incorporate each of these in flavorful and healthy salads, and how to create homemade salad dressings. Proper handling of ingredients is also covered. Cooking - Soups and [employer] - Soups and Desserts Clinical staff conducted group or individual video education with verbal and written material and guidebook.  Patient  learns that Pritikin soups and desserts make for easy, nutritious, and delicious snacks and meal components that are low in sodium, fat, sugar, and calorie density, while high in vitamins, minerals, and filling fiber. Recommendations include simple and healthy ideas for soups and desserts.   Overview     The Pritikin Solution Program Overview Clinical staff conducted group or individual video education with verbal and written material and guidebook.  Patient learns that the results of the Pritikin Program have been documented in more than 100 articles published in peer-reviewed journals, and the benefits include reducing risk factors for (and, in some cases, even reversing) high cholesterol, high blood pressure, type 2 diabetes, obesity, and more! An overview of the three key pillars of the Pritikin Program will be covered: eating well, doing regular exercise, and having a healthy mind-set.  WORKSHOPS  Exercise: Exercise Basics: Building Your Action Plan Clinical staff led group instruction and group discussion with PowerPoint presentation and patient guidebook. To enhance the learning environment the use of posters, models and videos may be added. At the conclusion of this workshop, patients will comprehend the difference between physical activity and exercise, as well as the benefits of incorporating both, into their routine. Patients will understand the FITT (Frequency, Intensity, Time, and Type) principle and how to use it to build an exercise action plan. In addition, safety concerns and other considerations for exercise and cardiac rehab will be addressed by the presenter. The purpose of this lesson is to promote a comprehensive and effective weekly exercise routine in order to improve patients' overall level of fitness.   Managing Heart Disease: Your Path to a Healthier Heart Clinical staff led group instruction and group discussion with PowerPoint presentation and patient guidebook. To  enhance the learning environment the use of posters, models and videos may be added.At the conclusion of this workshop, patients will understand the anatomy and physiology of the heart. Additionally, they will understand how Pritikin's three pillars impact the risk factors, the progression, and the management of heart disease.  The purpose of this lesson is to provide a high-level overview of the heart, heart disease, and how the Pritikin lifestyle positively impacts risk factors.  Exercise Biomechanics Clinical staff led group instruction and group discussion with PowerPoint presentation and patient guidebook. To enhance the learning environment the use of posters, models and videos may be added. Patients will learn how the structural parts of their bodies function and how these functions impact their daily activities, movement, and exercise. Patients will learn how to promote a neutral spine, learn how to manage pain, and identify ways to improve their physical movement in order to promote healthy living. The purpose of this lesson is to expose patients to common physical limitations that impact physical activity. Participants will learn practical ways to adapt and manage aches and pains, and to minimize their effect on regular exercise.  Patients will learn how to maintain good posture while sitting, walking, and lifting.  Balance Training and Fall Prevention  Clinical staff led group instruction and group discussion with PowerPoint presentation and patient guidebook. To enhance the learning environment the use of posters, models and videos may be added. At the conclusion of this workshop, patients will understand the importance of their sensorimotor skills (vision, proprioception, and the vestibular system) in maintaining their ability to balance as they age. Patients will apply a variety of balancing exercises that are appropriate for their current level of function. Patients will understand  the common causes for poor balance, possible solutions to these problems, and ways to modify their physical environment in order to minimize their fall risk. The purpose of this lesson is to teach patients about the importance of maintaining balance as they age and ways to minimize their risk of falling.  WORKSHOPS   Nutrition:  Fueling a Ship broker led group instruction and group discussion with PowerPoint presentation and patient guidebook. To enhance the learning environment the use of posters, models and videos may be added. Patients will review the foundational principles of the Pritikin Eating Plan and understand what constitutes a serving size in each of the food groups. Patients will also learn Pritikin-friendly foods that are better choices when away from home and review make-ahead meal and snack options. Calorie density will be reviewed and applied to three nutrition priorities: weight maintenance, weight loss, and weight gain. The purpose of this lesson is to reinforce (in a group setting) the key concepts around what patients are recommended to eat and how to apply these guidelines when away from home by planning and selecting Pritikin-friendly options. Patients will understand how calorie density may be adjusted for different weight management goals.  Mindful Eating  Clinical staff led group instruction and group discussion with PowerPoint presentation and patient guidebook. To enhance the learning environment the use of posters, models and videos may be added. Patients will briefly review the concepts of the Pritikin Eating Plan and the importance of low-calorie dense foods. The concept of mindful eating will be introduced as well as the importance of paying attention to internal hunger signals. Triggers for non-hunger eating and techniques for dealing with triggers will be explored. The purpose of this lesson is to provide patients with the opportunity to review the basic  principles of the Pritikin Eating Plan, discuss the value of eating mindfully and how to measure internal cues of hunger and fullness using the Hunger Scale. Patients will also discuss reasons for non-hunger eating and learn strategies to use for controlling emotional eating.  Targeting Your Nutrition Priorities Clinical staff led group instruction and group discussion with PowerPoint presentation and patient guidebook. To enhance the learning environment the use of posters, models and videos may be added. Patients will learn how to determine their genetic susceptibility to disease by reviewing their family history. Patients will gain insight into the importance of diet as part of an overall healthy lifestyle in mitigating the impact of genetics and other environmental insults. The purpose of this lesson is to provide patients with the opportunity to assess their personal nutrition priorities by looking at their family history, their own health history and current risk factors. Patients will also be able to discuss ways of prioritizing and modifying the Pritikin Eating Plan for their highest risk areas  Menu  Clinical staff led group instruction and group discussion with PowerPoint presentation and patient guidebook. To enhance the learning environment  the use of posters, models and videos may be added. Using menus brought in from [employer], or printed from [employer], patients will apply the Pritikin dining out guidelines that were presented in the [employer] video. Patients will also be able to practice these guidelines in a variety of provided scenarios. The purpose of this lesson is to provide patients with the opportunity to practice hands-on learning of the Pritikin Dining Out guidelines with actual menus and practice scenarios.  Label Reading Clinical staff led group instruction and group discussion with PowerPoint presentation and patient guidebook. To enhance the  learning environment the use of posters, models and videos may be added. Patients will review and discuss the Pritikin label reading guidelines presented in Pritikin's Label Reading Educational series video. Using fool labels brought in from local grocery stores and markets, patients will apply the label reading guidelines and determine if the packaged food meet the Pritikin guidelines. The purpose of this lesson is to provide patients with the opportunity to review, discuss, and practice hands-on learning of the Pritikin Label Reading guidelines with actual packaged food labels. Cooking School  Pritikin's [employer] are designed to teach patients ways to prepare quick, simple, and affordable recipes at home. The importance of nutrition's role in chronic disease risk reduction is reflected in its emphasis in the overall Pritikin program. By learning how to prepare essential core Pritikin Eating Plan recipes, patients will increase control over what they eat; be able to customize the flavor of foods without the use of added salt, sugar, or fat; and improve the quality of the food they consume. By learning a set of core recipes which are easily assembled, quickly prepared, and affordable, patients are more likely to prepare more healthy foods at home. These workshops focus on convenient breakfasts, simple entres, side dishes, and desserts which can be prepared with minimal effort and are consistent with nutrition recommendations for cardiovascular risk reduction. Cooking [employer] are taught by a Armed forces logistics/support/administrative officer (RD) who has been trained by the [employer]. The chef or RD has a clear understanding of the importance of minimizing - if not completely eliminating - added fat, sugar, and sodium in recipes. Throughout the series of Cooking School Workshop sessions, patients will learn about healthy ingredients and efficient methods of cooking to build confidence in their  capability to prepare    Cooking School weekly topics:  Adding Flavor- Sodium-Free  Fast and Healthy Breakfasts  Powerhouse Plant-Based Proteins  Satisfying Salads and Dressings  Simple Sides and Sauces  International Cuisine-Spotlight on the [employer] Zones  Delicious Desserts  Savory Soups  [employer] - Meals in a Astronomer Appetizers and Snacks  Comforting Weekend Breakfasts  One-Pot Wonders   Fast Evening Meals  Landscape architect Your Pritikin Plate  WORKSHOPS   Healthy Mindset (Psychosocial):  Focused Goals, Sustainable Changes Clinical staff led group instruction and group discussion with PowerPoint presentation and patient guidebook. To enhance the learning environment the use of posters, models and videos may be added. Patients will be able to apply effective goal setting strategies to establish at least one personal goal, and then take consistent, meaningful action toward that goal. They will learn to identify common barriers to achieving personal goals and develop strategies to overcome them. Patients will also gain an understanding of how our mind-set can impact our ability to achieve goals and the importance of cultivating a positive and growth-oriented mind-set. The purpose of  this lesson is to provide patients with a deeper understanding of how to set and achieve personal goals, as well as the tools and strategies needed to overcome common obstacles which may arise along the way.  From Head to Heart: The Power of a Healthy Outlook  Clinical staff led group instruction and group discussion with PowerPoint presentation and patient guidebook. To enhance the learning environment the use of posters, models and videos may be added. Patients will be able to recognize and describe the impact of emotions and mood on physical health. They will discover the importance of self-care and explore self-care practices which may work for them. Patients will also learn how  to utilize the 4 C's to cultivate a healthier outlook and better manage stress and challenges. The purpose of this lesson is to demonstrate to patients how a healthy outlook is an essential part of maintaining good health, especially as they continue their cardiac rehab journey.  Healthy Sleep for a Healthy Heart Clinical staff led group instruction and group discussion with PowerPoint presentation and patient guidebook. To enhance the learning environment the use of posters, models and videos may be added. At the conclusion of this workshop, patients will be able to demonstrate knowledge of the importance of sleep to overall health, well-being, and quality of life. They will understand the symptoms of, and treatments for, common sleep disorders. Patients will also be able to identify daytime and nighttime behaviors which impact sleep, and they will be able to apply these tools to help manage sleep-related challenges. The purpose of this lesson is to provide patients with a general overview of sleep and outline the importance of quality sleep. Patients will learn about a few of the most common sleep disorders. Patients will also be introduced to the concept of "sleep hygiene," and discover ways to self-manage certain sleeping problems through simple daily behavior changes. Finally, the workshop will motivate patients by clarifying the links between quality sleep and their goals of heart-healthy living.   Recognizing and Reducing Stress Clinical staff led group instruction and group discussion with PowerPoint presentation and patient guidebook. To enhance the learning environment the use of posters, models and videos may be added. At the conclusion of this workshop, patients will be able to understand the types of stress reactions, differentiate between acute and chronic stress, and recognize the impact that chronic stress has on their health. They will also be able to apply different coping mechanisms, such as  reframing negative self-talk. Patients will have the opportunity to practice a variety of stress management techniques, such as deep abdominal breathing, progressive muscle relaxation, and/or guided imagery.  The purpose of this lesson is to educate patients on the role of stress in their lives and to provide healthy techniques for coping with it.  Learning Barriers/Preferences:  Learning Barriers/Preferences - 01/11/24 1431       Learning Barriers/Preferences   Learning Barriers Sight   contacts   Learning Preferences Audio;Computer/Internet;Group Instruction;Individual Instruction;Pictoral;Skilled Demonstration;Verbal Instruction;Video;Written Material             Education Topics:  Knowledge Questionnaire Score:  Knowledge Questionnaire Score - 01/11/24 1432       Knowledge Questionnaire Score   Pre Score 24/24             Core Components/Risk Factors/Patient Goals at Admission:  Personal Goals and Risk Factors at Admission - 01/11/24 1431       Core Components/Risk Factors/Patient Goals on Admission    Weight Management Yes;Weight Maintenance  Intervention Weight Management: Develop a combined nutrition and exercise program designed to reach desired caloric intake, while maintaining appropriate intake of nutrient and fiber, sodium and fats, and appropriate energy expenditure required for the weight goal.;Weight Management: Provide education and appropriate resources to help participant work on and attain dietary goals.    Expected Outcomes Short Term: Continue to assess and modify interventions until short term weight is achieved;Long Term: Adherence to nutrition and physical activity/exercise program aimed toward attainment of established weight goal;Weight Maintenance: Understanding of the daily nutrition guidelines, which includes 25-35% calories from fat, 7% or less cal from saturated fats, less than 200mg  cholesterol, less than 1.5gm of sodium, & 5 or more servings of  fruits and vegetables daily;Understanding recommendations for meals to include 15-35% energy as protein, 25-35% energy from fat, 35-60% energy from carbohydrates, less than 200mg  of dietary cholesterol, 20-35 gm of total fiber daily;Understanding of distribution of calorie intake throughout the day with the consumption of 4-5 meals/snacks    Hypertension Yes    Intervention Provide education on lifestyle modifcations including regular physical activity/exercise, weight management, moderate sodium restriction and increased consumption of fresh fruit, vegetables, and low fat dairy, alcohol moderation, and smoking cessation.;Monitor prescription use compliance.    Expected Outcomes Short Term: Continued assessment and intervention until BP is < 140/21mm HG in hypertensive participants. < 130/43mm HG in hypertensive participants with diabetes, heart failure or chronic kidney disease.;Long Term: Maintenance of blood pressure at goal levels.             Core Components/Risk Factors/Patient Goals Review:   Goals and Risk Factor Review     Row Name 01/16/24 1638 02/01/24 1213           Core Components/Risk Factors/Patient Goals Review   Personal Goals Review Weight Management/Obesity;Hypertension Weight Management/Obesity;Hypertension      Review Triva started cardiac rehab on 01/16/24, Docie did well with exercise. Vital signs were stable Dariane started cardiac rehab on 01/16/24, Mikah is off to a good start with exercise with exercise at cardiac rehab.Intermittent resting and exertional BP elevations noted at cardiac rehab. Patient's cardiologist at [facility] was notfied. medications have been adjusted. Will continue to monitor BP's.      Expected Outcomes Francesca will continue to participate in cardiac rehab for exercise, nutrition and lifestyle modifications Muriah will continue to participate in cardiac rehab for exercise, nutrition and lifestyle modifications               Core  Components/Risk Factors/Patient Goals at Discharge (Final Review):   Goals and Risk Factor Review - 02/01/24 1213       Core Components/Risk Factors/Patient Goals Review   Personal Goals Review Weight Management/Obesity;Hypertension    Review Aniayah started cardiac rehab on 01/16/24, Sherise is off to a good start with exercise with exercise at cardiac rehab.Intermittent resting and exertional BP elevations noted at cardiac rehab. Patient's cardiologist at [facility] was notfied. medications have been adjusted. Will continue to monitor BP's.    Expected Outcomes Kaarin will continue to participate in cardiac rehab for exercise, nutrition and lifestyle modifications             ITP Comments:  ITP Comments     Row Name 01/11/24 1411 01/16/24 1636 02/01/24 1211       ITP Comments Dr. Gaylyn Keas medical director. Introduction to pritikin education/intensive cardiac rehab. Initial orientation packet reviewed with patient. 30 day ITP Review. Arshia started cardiac rehab on 01/16/24. Lenora did well with exercise. 30  day ITP Review. Pauletta started cardiac rehab on 01/16/24. Alanni is off to a good start with exercise.              Comments: See ITP comments.[name] RN BSN

## 2024-02-03 ENCOUNTER — Encounter (HOSPITAL_COMMUNITY)

## 2024-02-08 ENCOUNTER — Encounter (HOSPITAL_COMMUNITY)
Admission: RE | Admit: 2024-02-08 | Discharge: 2024-02-08 | Disposition: A | Discharge status: Home / Self Care | Source: Ambulatory Visit | Attending: Cardiology

## 2024-02-08 DIAGNOSIS — Z952 Presence of prosthetic heart valve: Secondary | ICD-10-CM

## 2024-02-10 ENCOUNTER — Encounter (HOSPITAL_COMMUNITY)
Admission: RE | Admit: 2024-02-10 | Discharge: 2024-02-10 | Disposition: A | Discharge status: Home / Self Care | Source: Ambulatory Visit | Attending: Cardiology | Admitting: Cardiology

## 2024-02-10 DIAGNOSIS — Z952 Presence of prosthetic heart valve: Secondary | ICD-10-CM

## 2024-02-10 NOTE — Progress Notes (Signed)
 Incomplete Session Note  Patient Details  Name: Melinda Lane MRN: [id number] Date of Birth: 1950/10/31 Referring Provider:   Flowsheet Row INTENSIVE CARDIAC REHAB ORIENT from 01/11/2024 in [facility]  Referring Provider Gaylyn Keas, MD       [name] did not complete her rehab session.  Blood pressure 142/98 heart rate 76. No exercise due to elevated BP. Patient's primary care cardiologist provider Jeremy Monk NP office  called and notified at [facility]. I spoke with [name]. [name] discussed with [name] who added olmesartan 40 mg once a day. [name] was called and notified about today's medication changes and will monitor her blood pressures daily as [name] will be on vacation next week and be absent from cardiac rehab.[name] RN BSN

## 2024-02-13 ENCOUNTER — Encounter (HOSPITAL_COMMUNITY)

## 2024-02-15 ENCOUNTER — Encounter (HOSPITAL_COMMUNITY)

## 2024-02-17 ENCOUNTER — Encounter (HOSPITAL_COMMUNITY)

## 2024-02-20 ENCOUNTER — Encounter (HOSPITAL_COMMUNITY)

## 2024-02-22 ENCOUNTER — Encounter (HOSPITAL_COMMUNITY)
Admission: RE | Admit: 2024-02-22 | Discharge: 2024-02-22 | Disposition: A | Discharge status: Home / Self Care | Source: Ambulatory Visit | Attending: Cardiology | Admitting: Cardiology

## 2024-02-22 DIAGNOSIS — Z48812 Encounter for surgical aftercare following surgery on the circulatory system: Secondary | ICD-10-CM | POA: Diagnosis not present

## 2024-02-22 DIAGNOSIS — Z952 Presence of prosthetic heart valve: Secondary | ICD-10-CM | POA: Insufficient documentation

## 2024-02-24 ENCOUNTER — Encounter (HOSPITAL_COMMUNITY)
Admission: RE | Admit: 2024-02-24 | Discharge: 2024-02-24 | Disposition: A | Discharge status: Home / Self Care | Source: Ambulatory Visit | Attending: Cardiology | Admitting: Cardiology

## 2024-02-24 DIAGNOSIS — Z952 Presence of prosthetic heart valve: Secondary | ICD-10-CM

## 2024-02-24 DIAGNOSIS — Z48812 Encounter for surgical aftercare following surgery on the circulatory system: Secondary | ICD-10-CM | POA: Diagnosis not present

## 2024-02-27 ENCOUNTER — Encounter (HOSPITAL_COMMUNITY): Admission: RE | Admit: 2024-02-27 | Source: Ambulatory Visit

## 2024-02-29 ENCOUNTER — Encounter (HOSPITAL_COMMUNITY)
Admission: RE | Admit: 2024-02-29 | Discharge: 2024-02-29 | Disposition: A | Discharge status: Home / Self Care | Source: Ambulatory Visit | Attending: Cardiology | Admitting: Cardiology

## 2024-02-29 DIAGNOSIS — Z952 Presence of prosthetic heart valve: Secondary | ICD-10-CM

## 2024-02-29 DIAGNOSIS — Z48812 Encounter for surgical aftercare following surgery on the circulatory system: Secondary | ICD-10-CM | POA: Diagnosis not present

## 2024-02-29 NOTE — Progress Notes (Signed)
 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 (Patient not taking: Reported on 02/10/2024), Disp: , Rfl:    dicyclomine (BENTYL) 20 MG tablet, Take 20 mg by mouth 4 (four) times daily. (Patient not taking: Reported on 02/10/2024), Disp: , Rfl:    docusate sodium (COLACE) 100 MG capsule, Take 100 mg by mouth daily., Disp: , Rfl:    ferrous sulfate 325 (65 FE) MG tablet, Take 325 mg by mouth daily with breakfast., Disp: , Rfl:    guaiFENesin (MUCINEX) 600 MG 12 hr tablet, Take 600 mg by mouth 2 (two) times daily as needed for cough. (Patient not taking: Reported on 02/10/2024), Disp: , Rfl:    ibuprofen  (ADVIL ) 800 MG tablet, Take 800 mg by mouth 3 (three) times daily. (Patient not taking: Reported on 02/10/2024), Disp: , Rfl:    MAGNESIUM PO, Take 200 mg by mouth daily., Disp: , Rfl:    mesalamine  (PENTASA ) 500 MG CR capsule, Take 1,000 mg by mouth 2 (two) times daily.   (Patient not taking: Reported on 11/10/2023), Disp: , Rfl:    methocarbamol (ROBAXIN) 500 MG tablet, Take 1 tablet every 6-8 hours by oral route as needed for spasm for 10 days., Disp: , Rfl:    metoprolol  succinate (TOPROL -XL) 25 MG 24 hr tablet, Take 25 mg by mouth daily., Disp: , Rfl:    Multiple Vitamin (MULTIVITAMIN) tablet, Take 1 tablet by mouth daily., Disp: , Rfl:    NONFORMULARY OR COMPOUNDED ITEM, Apply 4-6 tablets topically every 4 (four) months. Estrogen and Testosterone pellets put under the skin, Disp: , Rfl:    NP THYROID  90 MG tablet, Take 60 mg by mouth every morning., Disp: , Rfl:    Omega-3 Fatty Acids (FISH OIL) 1000 MG CAPS, Take 1,000 mg by mouth daily., Disp: , Rfl:    pramipexole (MIRAPEX) 0.25 MG tablet, Take 0.5 mg by mouth daily., Disp: , Rfl:    progesterone  (PROMETRIUM ) 200 MG capsule, Take 300 mg by mouth daily. 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 right   Cephalalgia 11/15/2014   Chest pain 04/12/2012   Crohn's disease of ileum without complication (HCC) 03/30/2016   Elevated troponin    Esophageal dysphagia 01/13/2017   HTN (hypertension) 04/12/2012   Hyperlipemia 04/12/2012   Hyperlipidemia 04/12/2012   Hypothyroidism    IBS (irritable bowel syndrome)    Incontinence in female 11/12/2014   Left knee pain 10/14/2011   Parkinson's disease (HCC) 07/24/2019   Pes  anserine bursitis 10/14/2011   Primary osteoarthritis involving multiple joints 11/25/2017   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(HCC)    RLS (restless legs syndrome) 10/10/2019   Thyroid  nodule 04/12/2012   Tremor 10/10/2019    Tobacco Use: Social History   Tobacco Use  Smoking Status Never  Smokeless Tobacco Never    Labs: Review Flowsheet       Latest Ref Rng & Units 04/12/2012  Labs for ITP Cardiac and Pulmonary Rehab  Cholestrol 0 - 200 mg/dL 409   LDL (calc) 0 - 99 mg/dL 95   HDL-C >81 mg/dL 69   Trlycerides <191 mg/dL 40     Capillary Blood Glucose: No results found for: GLUCAP   Exercise Target Goals: Exercise Program Goal: Individual exercise prescription set using results from initial 6 min walk test and THRR while considering  patient's activity barriers and safety.   Exercise Prescription Goal: Initial exercise prescription builds to 30-45 minutes a day of aerobic activity, 2-3 days per week.  Home exercise guidelines will be given to patient during program as part of exercise prescription that the participant will acknowledge.  Activity Barriers & Risk Stratification:  Activity Barriers & Cardiac Risk Stratification - 01/11/24 1430       Activity Barriers & Cardiac Risk Stratification   Activity Barriers Right Knee Replacement;Balance Concerns;Shortness of Breath;Joint Problems    Cardiac Risk Stratification High   <5 METs on         6 Minute Walk:  6 Minute Walk     Row Name 01/11/24 1534         6 Minute Walk   Phase Initial     Distance 1410 feet     Walk Time 6 minutes     # of Rest Breaks 0     MPH 2.67     METS 3.5     RPE 9     Perceived Dyspnea  1     VO2 Peak 12.25     Symptoms Yes (comment)     Comments SOB, resolved with rest     Resting HR 66 bpm     Resting BP 132/72     Resting Oxygen Saturation  98 %     Exercise Oxygen Saturation  during 6 min walk 98 %     Max Ex. HR 102 bpm     Max Ex. BP 164/88     2 Minute Post BP  142/78        Oxygen Initial Assessment:   Oxygen Re-Evaluation:   Oxygen Discharge (Final Oxygen Re-Evaluation):   Initial Exercise Prescription:  Initial Exercise Prescription - 01/11/24 1500       Date of Initial Exercise RX and Referring Provider   Date 01/11/24    Referring Provider Gaylyn Keas, MD    Expected Discharge Date 04/18/24      NuStep  Level 1    SPM 70    Minutes 15    METs 2      Track   Laps 16    Minutes 15    METs 2.5      Prescription Details   Frequency (times per week) 3    Duration Progress to 30 minutes of continuous aerobic without signs/symptoms of physical distress      Intensity   THRR 40-80% of Max Heartrate 60-120    Ratings of Perceived Exertion 11-13    Perceived Dyspnea 0-4      Progression   Progression Continue progressive overload as per policy without signs/symptoms or physical distress.      Resistance Training   Training Prescription Yes    Weight 3    Reps 10-15          Perform Capillary Blood Glucose checks as needed.  Exercise Prescription Changes:   Exercise Prescription Changes     Row Name 01/16/24 1503 02/08/24 1630           Response to Exercise   Blood Pressure (Admit) 132/70 154/88      Blood Pressure (Exercise) 148/70 142/80      Blood Pressure (Exit) 133/84 142/92      Heart Rate (Admit) 65 bpm 76 bpm      Heart Rate (Exercise) 102 bpm 96 bpm      Heart Rate (Exit) 75 bpm 74 bpm      Rating of Perceived Exertion (Exercise) 9 8.5      Symptoms None None      Comments Off to a great start with exercise. REVD MET's and goals      Duration Continue with 30 min of aerobic exercise without signs/symptoms of physical distress. Continue with 30 min of aerobic exercise without signs/symptoms of physical distress.      Intensity THRR unchanged THRR unchanged        Progression   Progression Continue to progress workloads to maintain intensity without signs/symptoms of physical distress.  Continue to progress workloads to maintain intensity without signs/symptoms of physical distress.      Average METs 3.4 3.32        Resistance Training   Training Prescription Yes No      Weight 3 lbs 3 lbs      Reps 10-15 10-15      Time 10 Minutes 10 Minutes        Interval Training   Interval Training No No        Recumbant Elliptical   Level 1 3      RPM -- 45      Watts 69 63      Minutes 15 15      METs 4 3.6        Track   Laps 15 16      Minutes 15 15      METs 2.91 3.04         Exercise Comments:   Exercise Comments     Row Name 01/16/24 1634 02/08/24 1630 02/09/24 0826 02/22/24 1425     Exercise Comments Kammy tolerated first session of exercise well without symptoms. Oriented to the exercise equipment and stretching routine. REVD MET's and goals. Pt tolerated exercise well with an average MET level of 3.32. First day back, will continue to work with her to progress workloads as tolerated and increase MET's over time. She will be out next week for travel. But is feeling good  and has gotten back to golfing and is enjoying it -- Absent since 02/10/24 will review education when she returns       Exercise Goals and Review:   Exercise Goals     Row Name 01/11/24 1430             Exercise Goals   Increase Physical Activity Yes       Intervention Provide advice, education, support and counseling about physical activity/exercise needs.;Develop an individualized exercise prescription for aerobic and resistive training based on initial evaluation findings, risk stratification, comorbidities and participant's personal goals.       Expected Outcomes Short Term: Attend rehab on a regular basis to increase amount of physical activity.;Long Term: Exercising regularly at least 3-5 days a week.;Long Term: Add in home exercise to make exercise part of routine and to increase amount of physical activity.       Increase Strength and Stamina Yes       Intervention Provide  advice, education, support and counseling about physical activity/exercise needs.;Develop an individualized exercise prescription for aerobic and resistive training based on initial evaluation findings, risk stratification, comorbidities and participant's personal goals.       Expected Outcomes Short Term: Increase workloads from initial exercise prescription for resistance, speed, and METs.;Short Term: Perform resistance training exercises routinely during rehab and add in resistance training at home;Long Term: Improve cardiorespiratory fitness, muscular endurance and strength as measured by increased METs and functional capacity ( )       Able to understand and use rate of perceived exertion (RPE) scale Yes       Intervention Provide education and explanation on how to use RPE scale       Expected Outcomes Short Term: Able to use RPE daily in rehab to express subjective intensity level;Long Term:  Able to use RPE to guide intensity level when exercising independently       Knowledge and understanding of Target Heart Rate Range (THRR) Yes       Intervention Provide education and explanation of THRR including how the numbers were predicted and where they are located for reference       Expected Outcomes Short Term: Able to state/look up THRR;Short Term: Able to use daily as guideline for intensity in rehab;Long Term: Able to use THRR to govern intensity when exercising independently       Understanding of Exercise Prescription Yes       Intervention Provide education, explanation, and written materials on patient's individual exercise prescription       Expected Outcomes Short Term: Able to explain program exercise prescription;Long Term: Able to explain home exercise prescription to exercise independently          Exercise Goals Re-Evaluation :  Exercise Goals Re-Evaluation     Row Name 01/16/24 1634 02/08/24 1630 02/09/24 0822         Exercise Goal Re-Evaluation   Exercise Goals Review  Increase Physical Activity;Increase Strength and Stamina;Able to understand and use rate of perceived exertion (RPE) scale Increase Physical Activity;Increase Strength and Stamina;Able to understand and use rate of perceived exertion (RPE) scale;Knowledge and understanding of Target Heart Rate Range (THRR);Understanding of Exercise Prescription --     Comments Carlyann was able to understand and use RPE scale appropriately. REVD MET's and goals. Pt tolerated exercise well with an average MET level of 3.32. First day back, will continue to work with her to progress workloads as tolerated and increase MET's over time. She will be out next  week for travel. But is feeling good and has gotten back to golfing and is enjoying it --     Expected Outcomes Progress workloads as tolerated to help improve cardiorespiratory fitness. Progress workloads as tolerated to help improve cardiorespiratory fitness. --        Discharge Exercise Prescription (Final Exercise Prescription Changes):  Exercise Prescription Changes - 02/08/24 1630       Response to Exercise   Blood Pressure (Admit) 154/88    Blood Pressure (Exercise) 142/80    Blood Pressure (Exit) 142/92    Heart Rate (Admit) 76 bpm    Heart Rate (Exercise) 96 bpm    Heart Rate (Exit) 74 bpm    Rating of Perceived Exertion (Exercise) 8.5    Symptoms None    Comments REVD MET's and goals    Duration Continue with 30 min of aerobic exercise without signs/symptoms of physical distress.    Intensity THRR unchanged      Progression   Progression Continue to progress workloads to maintain intensity without signs/symptoms of physical distress.    Average METs 3.32      Resistance Training   Training Prescription No    Weight 3 lbs    Reps 10-15    Time 10 Minutes      Interval Training   Interval Training No      Recumbant Elliptical   Level 3    RPM 45    Watts 63    Minutes 15    METs 3.6      Track   Laps 16    Minutes 15    METs 3.04           Nutrition:  Target Goals: Understanding of nutrition guidelines, daily intake of sodium 1500mg , cholesterol 200mg , calories 30% from fat and 7% or less from saturated fats, daily to have 5 or more servings of fruits and vegetables.  Biometrics:  Pre Biometrics - 01/11/24 1419       Pre Biometrics   Waist Circumference 32 inches    Hip Circumference 36 inches    Waist to Hip Ratio 0.89 %    Triceps Skinfold 25 mm    % Body Fat 35.5 %    Grip Strength 30 kg    Flexibility 11.5 in    Single Leg Stand 4.62 seconds           Nutrition Therapy Plan and Nutrition Goals:  Nutrition Therapy & Goals - 02/22/24 0858       Nutrition Therapy   Diet Heart Healthy Diet      Personal Nutrition Goals   Nutrition Goal Patient to identify strategies for reducing cardiovascular risk by attending the [facility] education and nutrition series weekly.   Goal in progress.   Personal Goal #2 Patient to improve diet quality by using the plate method as a guide for meal planning to include lean protein/plant protein, fruits, vegetables, whole grains, nonfat dairy as part of a well-balanced diet.   Goal in progress.   Personal Goal #3 Patient to reduce sodium intake to 2300mg  per day    Comments Goals in progress. Patient has medical history of crohn's disease, parkinson's, s/p AVR, renovascular HTN, hyperlipidemia. Lipids and A1c are WNL Lasix 20mg  started for seven days at 12/27/23 cardiology follow-up and olmesartan 40mg  started on 02/10/23 to aid with improved blood pressure control. Patient continues to attend the [facility] education and nutrition series; will continue education of reading food labels, sodium intake/recommendations, etc. Patient will  benefit from participation in intensive cardiac rehab for nutrition, exercise, and lifestyle modification.      Intervention Plan   Intervention Prescribe, educate and counsel regarding individualized specific dietary modifications aiming  towards targeted core components such as weight, hypertension, lipid management, diabetes, heart failure and other comorbidities.;Nutrition handout(s) given to patient.    Expected Outcomes Short Term Goal: Understand basic principles of dietary content, such as calories, fat, sodium, cholesterol and nutrients.;Long Term Goal: Adherence to prescribed nutrition plan.          Nutrition Assessments:  Nutrition Assessments - 01/19/24 1526       Rate Your Plate Scores   Pre Score 59         MEDIFICTS Score Key: >=70 Need to make dietary changes  40-70 Heart Healthy Diet <= 40 Therapeutic Level Cholesterol Diet   Flowsheet Row INTENSIVE CARDIAC REHAB from 01/18/2024 in [facility] for Heart, Vascular, & Lung [facility]  Picture Your Plate Total Score on Admission 59   Picture Your Plate Scores: <30 Unhealthy dietary pattern with much room for improvement. 41-50 Dietary pattern unlikely to meet recommendations for good health and room for improvement. 51-60 More healthful dietary pattern, with some room for improvement.  >60 Healthy dietary pattern, although there may be some specific behaviors that could be improved.    Nutrition Goals Re-Evaluation:  Nutrition Goals Re-Evaluation     Row Name 01/16/24 1544 02/22/24 0858           Goals   Current Weight 142 lb 3.2 oz (64.5 kg) 143 lb 4.8 oz (65 kg)      Comment BMP WNL, lipids WNL, LDL 82, HDL 93, A1c WNL BMP WNL, lipids WNL, LDL 82, HDL 93, A1c WNL      Expected Outcome Patient has medical history of crohn's disease, parkinson's, s/p AVR, renovascular HTN, hyperlipidemia. Lipids and A1c are WNL. Patient will benefit from participation in intensive cardiac rehab for nutrition, exercise, and lifestyle modification. Goals in progress. Patient has medical history of crohn's disease, parkinson's, s/p AVR, renovascular HTN, hyperlipidemia. Lipids and A1c are WNL Lasix 20mg  started for seven days at 12/27/23  cardiology follow-up and olmesartan 40mg  started on 02/10/23 to aid with improved blood pressure control. Patient continues to attend the [facility] education and nutrition series; will continue education of reading food labels, sodium intake/recommendations, etc. Patient will benefit from participation in intensive cardiac rehab for nutrition, exercise, and lifestyle modification.         Nutrition Goals Re-Evaluation:  Nutrition Goals Re-Evaluation     Row Name 01/16/24 1544 02/22/24 0858           Goals   Current Weight 142 lb 3.2 oz (64.5 kg) 143 lb 4.8 oz (65 kg)      Comment BMP WNL, lipids WNL, LDL 82, HDL 93, A1c WNL BMP WNL, lipids WNL, LDL 82, HDL 93, A1c WNL      Expected Outcome Patient has medical history of crohn's disease, parkinson's, s/p AVR, renovascular HTN, hyperlipidemia. Lipids and A1c are WNL. Patient will benefit from participation in intensive cardiac rehab for nutrition, exercise, and lifestyle modification. Goals in progress. Patient has medical history of crohn's disease, parkinson's, s/p AVR, renovascular HTN, hyperlipidemia. Lipids and A1c are WNL Lasix 20mg  started for seven days at 12/27/23 cardiology follow-up and olmesartan 40mg  started on 02/10/23 to aid with improved blood pressure control. Patient continues to attend the [facility] education and nutrition series; will continue education of reading food labels,  sodium intake/recommendations, etc. Patient will benefit from participation in intensive cardiac rehab for nutrition, exercise, and lifestyle modification.         Nutrition Goals Discharge (Final Nutrition Goals Re-Evaluation):  Nutrition Goals Re-Evaluation - 02/22/24 0858       Goals   Current Weight 143 lb 4.8 oz (65 kg)    Comment BMP WNL, lipids WNL, LDL 82, HDL 93, A1c WNL    Expected Outcome Goals in progress. Patient has medical history of crohn's disease, parkinson's, s/p AVR, renovascular HTN, hyperlipidemia. Lipids and A1c are WNL Lasix 20mg   started for seven days at 12/27/23 cardiology follow-up and olmesartan 40mg  started on 02/10/23 to aid with improved blood pressure control. Patient continues to attend the [facility] education and nutrition series; will continue education of reading food labels, sodium intake/recommendations, etc. Patient will benefit from participation in intensive cardiac rehab for nutrition, exercise, and lifestyle modification.          Psychosocial: Target Goals: Acknowledge presence or absence of significant depression and/or stress, maximize coping skills, provide positive support system. Participant is able to verbalize types and ability to use techniques and skills needed for reducing stress and depression.  Initial Review & Psychosocial Screening:  Initial Psych Review & Screening - 01/11/24 1431       Initial Review   Current issues with None Identified      Family Dynamics   Good Support System? Yes   husband and very close family     Barriers   Psychosocial barriers to participate in program There are no identifiable barriers or psychosocial needs.      Screening Interventions   Interventions Encouraged to exercise;Provide feedback about the scores to participant    Expected Outcomes Short Term goal: Identification and review with participant of any Quality of Life or Depression concerns found by scoring the questionnaire.;Long Term goal: The participant improves quality of Life and PHQ9 Scores as seen by post scores and/or verbalization of changes          Quality of Life Scores:  Quality of Life - 01/11/24 1537       Quality of Life   Select Quality of Life      Quality of Life Scores   Health/Function Pre 27.6 %    Socioeconomic Pre 30 %    Psych/Spiritual Pre 30 %    Family Pre 30 %    GLOBAL Pre 28.94 %         Scores of 19 and below usually indicate a poorer quality of life in these areas.  A difference of  2-3 points is a clinically meaningful difference.  A difference  of 2-3 points in the total score of the Quality of Life Index has been associated with significant improvement in overall quality of life, self-image, physical symptoms, and general health in studies assessing change in quality of life.  PHQ-9: Review Flowsheet       01/11/2024  Depression screen PHQ 2/9  Decreased Interest 0  Down, Depressed, Hopeless 0  PHQ - 2 Score 0  Altered sleeping 1  Tired, decreased energy 1  Change in appetite 0  Feeling bad or failure about yourself  0  Trouble concentrating 0  Moving slowly or fidgety/restless 0  Suicidal thoughts 0  PHQ-9 Score 2  Difficult doing work/chores Somewhat difficult   Interpretation of Total Score  Total Score Depression Severity:  1-4 = Minimal depression, 5-9 = Mild depression, 10-14 = Moderate depression, 15-19 = Moderately severe  depression, 20-27 = Severe depression   Psychosocial Evaluation and Intervention:   Psychosocial Re-Evaluation:  Psychosocial Re-Evaluation     Row Name 01/16/24 1637 01/24/24 1518 02/01/24 1211 02/29/24 0736       Psychosocial Re-Evaluation   Current issues with None Identified None Identified None Identified None Identified    Comments -- Discussed PHQ2-9. Xoe says that her energy level has improved since she has been particpating in cardiac rehab -- --    Interventions Encouraged to attend Cardiac Rehabilitation for the exercise Encouraged to attend Cardiac Rehabilitation for the exercise Encouraged to attend Cardiac Rehabilitation for the exercise Encouraged to attend Cardiac Rehabilitation for the exercise    Continue Psychosocial Services  No Follow up required No Follow up required No Follow up required No Follow up required       Psychosocial Discharge (Final Psychosocial Re-Evaluation):  Psychosocial Re-Evaluation - 02/29/24 0736       Psychosocial Re-Evaluation   Current issues with None Identified    Interventions Encouraged to attend Cardiac Rehabilitation for the  exercise    Continue Psychosocial Services  No Follow up required          Vocational Rehabilitation: Provide vocational rehab assistance to qualifying candidates.   Vocational Rehab Evaluation & Intervention:  Vocational Rehab - 01/11/24 1431       Initial Vocational Rehab Evaluation & Intervention   Assessment shows need for Vocational Rehabilitation No   Teighan is working         Education: Education Goals: Education classes will be provided on a weekly basis, covering required topics. Participant will state understanding/return demonstration of topics presented.    Education     Row Name 01/16/24 1600     Education   Cardiac Education Topics [facility]   Nurse, children's Exercise Physiologist   Select Psychosocial   Psychosocial Healthy Minds, Bodies, Hearts   Instruction Review Code 1- Verbalizes Understanding   Class Start Time 1410   Class Stop Time 1445   Class Time Calculation (min) 35 min    Row Name 01/18/24 1500     Education   Cardiac Education Topics [facility]   Customer service manager   Weekly Topic Adding Flavor - Sodium-Free   Instruction Review Code 1- Verbalizes Understanding   Class Start Time 1400   Class Stop Time 1440   Class Time Calculation (min) 40 min    Row Name 01/20/24 1500     Education   Cardiac Education Topics Pritikin   Select Workshops     Workshops   Educator Exercise Physiologist   Select Exercise   Exercise Workshop Location manager and Fall Prevention   Instruction Review Code 1- Verbalizes Understanding   Class Start Time 1407   Class Stop Time 1447   Class Time Calculation (min) 40 min    Row Name 01/23/24 1500     Education   Cardiac Education Topics Pritikin   Glass blower/designer Nutrition   Nutrition Workshop Label Reading   Instruction Review Code 1- Tax inspector   Class Start  Time 1400   Class Stop Time 1437   Class Time Calculation (min) 37 min    Row Name 01/25/24 1400     Education   Cardiac Education Topics Pritikin   Psychologist, sport and exercise     Core Videos  Educator Exercise Physiologist   Select Nutrition   Nutrition Other  Label reading   Instruction Review Code 1- Verbalizes Understanding   Class Start Time 1350   Class Stop Time 1435   Class Time Calculation (min) 45 min    Row Name 01/27/24 1500     Education   Cardiac Education Topics Pritikin   Customer service manager   Weekly Topic Fast and Healthy Breakfasts   Instruction Review Code 1- Verbalizes Understanding   Class Start Time 1400   Class Stop Time 1440   Class Time Calculation (min) 40 min    Row Name 02/08/24 1500     Education   Cardiac Education Topics Pritikin   Orthoptist   Educator Dietitian   Weekly Topic Delicious Desserts   Instruction Review Code 1- Verbalizes Understanding   Class Start Time 1400   Class Stop Time 1437   Class Time Calculation (min) 37 min    Row Name 02/10/24 1500     Education   Cardiac Education Topics Pritikin   Licensed conveyancer Nutrition   Nutrition Calorie Density   Instruction Review Code 1- Verbalizes Understanding   Class Start Time 1400   Class Stop Time 1440   Class Time Calculation (min) 40 min    Row Name 02/22/24 1500     Education   Cardiac Education Topics Pritikin   Customer service manager   Weekly Topic One-Pot Wonders   Instruction Review Code 1- Verbalizes Understanding   Class Start Time 1400   Class Stop Time 1445   Class Time Calculation (min) 45 min    Row Name 02/24/24 1500     Education   Cardiac Education Topics Pritikin   Glass blower/designer Nutrition   Nutrition Workshop Targeting Your  Nutrition Priorities   Instruction Review Code 1- Verbalizes Understanding   Class Start Time 1400   Class Stop Time 1440   Class Time Calculation (min) 40 min      Core Videos: Exercise    Move It!  Clinical staff conducted group or individual video education with verbal and written material and guidebook.  Patient learns the recommended Pritikin exercise program. Exercise with the goal of living a long, healthy life. Some of the health benefits of exercise include controlled diabetes, healthier blood pressure levels, improved cholesterol levels, improved heart and lung capacity, improved sleep, and better body composition. Everyone should speak with their doctor before starting or changing an exercise routine.  Biomechanical Limitations Clinical staff conducted group or individual video education with verbal and written material and guidebook.  Patient learns how biomechanical limitations can impact exercise and how we can mitigate and possibly overcome limitations to have an impactful and balanced exercise routine.  Body Composition Clinical staff conducted group or individual video education with verbal and written material and guidebook.  Patient learns that body composition (ratio of muscle mass to fat mass) is a key component to assessing overall fitness, rather than body weight alone. Increased fat mass, especially visceral belly fat, can put us  at increased risk for metabolic syndrome, type 2 diabetes, heart disease, and even death. It is recommended to combine diet and exercise (cardiovascular and resistance training) to  improve your body composition. Seek guidance from your physician and exercise physiologist before implementing an exercise routine.  Exercise Action Plan Clinical staff conducted group or individual video education with verbal and written material and guidebook.  Patient learns the recommended strategies to achieve and enjoy long-term exercise adherence, including  variety, self-motivation, self-efficacy, and positive decision making. Benefits of exercise include fitness, good health, weight management, more energy, better sleep, less stress, and overall well-being.  Medical   Heart Disease Risk Reduction Clinical staff conducted group or individual video education with verbal and written material and guidebook.  Patient learns our heart is our most vital organ as it circulates oxygen, nutrients, white blood cells, and hormones throughout the entire body, and carries waste away. Data supports a plant-based eating plan like the Pritikin Program for its effectiveness in slowing progression of and reversing heart disease. The video provides a number of recommendations to address heart disease.   Metabolic Syndrome and Belly Fat  Clinical staff conducted group or individual video education with verbal and written material and guidebook.  Patient learns what metabolic syndrome is, how it leads to heart disease, and how one can reverse it and keep it from coming back. You have metabolic syndrome if you have 3 of the following 5 criteria: abdominal obesity, high blood pressure, high triglycerides, low HDL cholesterol, and high blood sugar.  Hypertension and Heart Disease Clinical staff conducted group or individual video education with verbal and written material and guidebook.  Patient learns that high blood pressure, or hypertension, is very common in the [place] . Hypertension is largely due to excessive salt intake, but other important risk factors include being overweight, physical inactivity, drinking too much alcohol, smoking, and not eating enough potassium from fruits and vegetables. High blood pressure is a leading risk factor for heart attack, stroke, congestive heart failure, dementia, kidney failure, and premature death. Long-term effects of excessive salt intake include stiffening of the arteries and thickening of heart muscle and organ damage.  Recommendations include ways to reduce hypertension and the risk of heart disease.  Diseases of Our Time - Focusing on Diabetes Clinical staff conducted group or individual video education with verbal and written material and guidebook.  Patient learns why the best way to stop diseases of our time is prevention, through food and other lifestyle changes. Medicine (such as prescription pills and surgeries) is often only a Band-Aid on the problem, not a long-term solution. Most common diseases of our time include obesity, type 2 diabetes, hypertension, heart disease, and cancer. The Pritikin Program is recommended and has been proven to help reduce, reverse, and/or prevent the damaging effects of metabolic syndrome.  Nutrition   Overview of the Pritikin Eating Plan  Clinical staff conducted group or individual video education with verbal and written material and guidebook.  Patient learns about the Pritikin Eating Plan for disease risk reduction. The Pritikin Eating Plan emphasizes a wide variety of unrefined, minimally-processed carbohydrates, like fruits, vegetables, whole grains, and legumes. Go, Caution, and Stop food choices are explained. Plant-based and lean animal proteins are emphasized. Rationale provided for low sodium intake for blood pressure control, low added sugars for blood sugar stabilization, and low added fats and oils for coronary artery disease risk reduction and weight management.  Calorie Density  Clinical staff conducted group or individual video education with verbal and written material and guidebook.  Patient learns about calorie density and how it impacts the Pritikin Eating Plan. Knowing the characteristics of the food you  choose will help you decide whether those foods will lead to weight gain or weight loss, and whether you want to consume more or less of them. Weight loss is usually a side effect of the Pritikin Eating Plan because of its focus on low calorie-dense  foods.  Label Reading  Clinical staff conducted group or individual video education with verbal and written material and guidebook.  Patient learns about the Pritikin recommended label reading guidelines and corresponding recommendations regarding calorie density, added sugars, sodium content, and whole grains.  Dining Out - Part 1  Clinical staff conducted group or individual video education with verbal and written material and guidebook.  Patient learns that restaurant meals can be sabotaging because they can be so high in calories, fat, sodium, and/or sugar. Patient learns recommended strategies on how to positively address this and avoid unhealthy pitfalls.  Facts on Fats  Clinical staff conducted group or individual video education with verbal and written material and guidebook.  Patient learns that lifestyle modifications can be just as effective, if not more so, as many medications for lowering your risk of heart disease. A Pritikin lifestyle can help to reduce your risk of inflammation and atherosclerosis (cholesterol build-up, or plaque, in the artery walls). Lifestyle interventions such as dietary choices and physical activity address the cause of atherosclerosis. A review of the types of fats and their impact on blood cholesterol levels, along with dietary recommendations to reduce fat intake is also included.  Nutrition Action Plan  Clinical staff conducted group or individual video education with verbal and written material and guidebook.  Patient learns how to incorporate Pritikin recommendations into their lifestyle. Recommendations include planning and keeping personal health goals in mind as an important part of their success.  Healthy Mind-Set    Healthy Minds, Bodies, Hearts  Clinical staff conducted group or individual video education with verbal and written material and guidebook.  Patient learns how to identify when they are stressed. Video will discuss the impact of that  stress, as well as the many benefits of stress management. Patient will also be introduced to stress management techniques. The way we think, act, and feel has an impact on our hearts.  How Our Thoughts Can Heal Our Hearts  Clinical staff conducted group or individual video education with verbal and written material and guidebook.  Patient learns that negative thoughts can cause depression and anxiety. This can result in negative lifestyle behavior and serious health problems. Cognitive behavioral therapy is an effective method to help control our thoughts in order to change and improve our emotional outlook.  Additional Videos:  Exercise    Improving Performance  Clinical staff conducted group or individual video education with verbal and written material and guidebook.  Patient learns to use a non-linear approach by alternating intensity levels and lengths of time spent exercising to help burn more calories and lose more body fat. Cardiovascular exercise helps improve heart health, metabolism, hormonal balance, blood sugar control, and recovery from fatigue. Resistance training improves strength, endurance, balance, coordination, reaction time, metabolism, and muscle mass. Flexibility exercise improves circulation, posture, and balance. Seek guidance from your physician and exercise physiologist before implementing an exercise routine and learn your capabilities and proper form for all exercise.  Introduction to Yoga  Clinical staff conducted group or individual video education with verbal and written material and guidebook.  Patient learns about yoga, a discipline of the coming together of mind, breath, and body. The benefits of yoga include improved flexibility, improved  range of motion, better posture and core strength, increased lung function, weight loss, and positive self-image. Yoga's heart health benefits include lowered blood pressure, healthier heart rate, decreased cholesterol and  triglyceride levels, improved immune function, and reduced stress. Seek guidance from your physician and exercise physiologist before implementing an exercise routine and learn your capabilities and proper form for all exercise.  Medical   Aging: Enhancing Your Quality of Life  Clinical staff conducted group or individual video education with verbal and written material and guidebook.  Patient learns key strategies and recommendations to stay in good physical health and enhance quality of life, such as prevention strategies, having an advocate, securing a Health Care Proxy and Power of Attorney, and keeping a list of medications and system for tracking them. It also discusses how to avoid risk for bone loss.  Biology of Weight Control  Clinical staff conducted group or individual video education with verbal and written material and guidebook.  Patient learns that weight gain occurs because we consume more calories than we burn (eating more, moving less). Even if your body weight is normal, you may have higher ratios of fat compared to muscle mass. Too much body fat puts you at increased risk for cardiovascular disease, heart attack, stroke, type 2 diabetes, and obesity-related cancers. In addition to exercise, following the Pritikin Eating Plan can help reduce your risk.  Decoding Lab Results  Clinical staff conducted group or individual video education with verbal and written material and guidebook.  Patient learns that lab test reflects one measurement whose values change over time and are influenced by many factors, including medication, stress, sleep, exercise, food, hydration, pre-existing medical conditions, and more. It is recommended to use the knowledge from this video to become more involved with your lab results and evaluate your numbers to speak with your doctor.   Diseases of Our Time - Overview  Clinical staff conducted group or individual video education with verbal and written  material and guidebook.  Patient learns that according to the CDC, 50% to 70% of chronic diseases (such as obesity, type 2 diabetes, elevated lipids, hypertension, and heart disease) are avoidable through lifestyle improvements including healthier food choices, listening to satiety cues, and increased physical activity.  Sleep Disorders Clinical staff conducted group or individual video education with verbal and written material and guidebook.  Patient learns how good quality and duration of sleep are important to overall health and well-being. Patient also learns about sleep disorders and how they impact health along with recommendations to address them, including discussing with a physician.  Nutrition  Dining Out - Part 2 Clinical staff conducted group or individual video education with verbal and written material and guidebook.  Patient learns how to plan ahead and communicate in order to maximize their dining experience in a healthy and nutritious manner. Included are recommended food choices based on the type of restaurant the patient is visiting.   Fueling a Banker conducted group or individual video education with verbal and written material and guidebook.  There is a strong connection between our food choices and our health. Diseases like obesity and type 2 diabetes are very prevalent and are in large-part due to lifestyle choices. The Pritikin Eating Plan provides plenty of food and hunger-curbing satisfaction. It is easy to follow, affordable, and helps reduce health risks.  Menu Workshop  Clinical staff conducted group or individual video education with verbal and written material and guidebook.  Patient learns that restaurant meals  can sabotage health goals because they are often packed with calories, fat, sodium, and sugar. Recommendations include strategies to plan ahead and to communicate with the manager, chef, or server to help order a healthier  meal.  Planning Your Eating Strategy  Clinical staff conducted group or individual video education with verbal and written material and guidebook.  Patient learns about the Pritikin Eating Plan and its benefit of reducing the risk of disease. The Pritikin Eating Plan does not focus on calories. Instead, it emphasizes high-quality, nutrient-rich foods. By knowing the characteristics of the foods, we choose, we can determine their calorie density and make informed decisions.  Targeting Your Nutrition Priorities  Clinical staff conducted group or individual video education with verbal and written material and guidebook.  Patient learns that lifestyle habits have a tremendous impact on disease risk and progression. This video provides eating and physical activity recommendations based on your personal health goals, such as reducing LDL cholesterol, losing weight, preventing or controlling type 2 diabetes, and reducing high blood pressure.  Vitamins and Minerals  Clinical staff conducted group or individual video education with verbal and written material and guidebook.  Patient learns different ways to obtain key vitamins and minerals, including through a recommended healthy diet. It is important to discuss all supplements you take with your doctor.   Healthy Mind-Set    Smoking Cessation  Clinical staff conducted group or individual video education with verbal and written material and guidebook.  Patient learns that cigarette smoking and tobacco addiction pose a serious health risk which affects millions of people. Stopping smoking will significantly reduce the risk of heart disease, lung disease, and many forms of cancer. Recommended strategies for quitting are covered, including working with your doctor to develop a successful plan.  Culinary   Becoming a Set designer conducted group or individual video education with verbal and written material and guidebook.  Patient learns  that cooking at home can be healthy, cost-effective, quick, and puts them in control. Keys to cooking healthy recipes will include looking at your recipe, assessing your equipment needs, planning ahead, making it simple, choosing cost-effective seasonal ingredients, and limiting the use of added fats, salts, and sugars.  Cooking - Breakfast and Snacks  Clinical staff conducted group or individual video education with verbal and written material and guidebook.  Patient learns how important breakfast is to satiety and nutrition through the entire day. Recommendations include key foods to eat during breakfast to help stabilize blood sugar levels and to prevent overeating at meals later in the day. Planning ahead is also a key component.  Cooking - Educational psychologist conducted group or individual video education with verbal and written material and guidebook.  Patient learns eating strategies to improve overall health, including an approach to cook more at home. Recommendations include thinking of animal protein as a side on your plate rather than center stage and focusing instead on lower calorie dense options like vegetables, fruits, whole grains, and plant-based proteins, such as beans. Making sauces in large quantities to freeze for later and leaving the skin on your vegetables are also recommended to maximize your experience.  Cooking - Healthy Salads and Dressing Clinical staff conducted group or individual video education with verbal and written material and guidebook.  Patient learns that vegetables, fruits, whole grains, and legumes are the foundations of the Pritikin Eating Plan. Recommendations include how to incorporate each of these in flavorful and healthy salads, and how to  create homemade salad dressings. Proper handling of ingredients is also covered. Cooking - Soups and [employer] - Soups and Desserts Clinical staff conducted group or individual video education with  verbal and written material and guidebook.  Patient learns that Pritikin soups and desserts make for easy, nutritious, and delicious snacks and meal components that are low in sodium, fat, sugar, and calorie density, while high in vitamins, minerals, and filling fiber. Recommendations include simple and healthy ideas for soups and desserts.   Overview     The Pritikin Solution Program Overview Clinical staff conducted group or individual video education with verbal and written material and guidebook.  Patient learns that the results of the Pritikin Program have been documented in more than 100 articles published in peer-reviewed journals, and the benefits include reducing risk factors for (and, in some cases, even reversing) high cholesterol, high blood pressure, type 2 diabetes, obesity, and more! An overview of the three key pillars of the Pritikin Program will be covered: eating well, doing regular exercise, and having a healthy mind-set.  WORKSHOPS  Exercise: Exercise Basics: Building Your Action Plan Clinical staff led group instruction and group discussion with PowerPoint presentation and patient guidebook. To enhance the learning environment the use of posters, models and videos may be added. At the conclusion of this workshop, patients will comprehend the difference between physical activity and exercise, as well as the benefits of incorporating both, into their routine. Patients will understand the FITT (Frequency, Intensity, Time, and Type) principle and how to use it to build an exercise action plan. In addition, safety concerns and other considerations for exercise and cardiac rehab will be addressed by the presenter. The purpose of this lesson is to promote a comprehensive and effective weekly exercise routine in order to improve patients' overall level of fitness.   Managing Heart Disease: Your Path to a Healthier Heart Clinical staff led group instruction and group discussion with  PowerPoint presentation and patient guidebook. To enhance the learning environment the use of posters, models and videos may be added.At the conclusion of this workshop, patients will understand the anatomy and physiology of the heart. Additionally, they will understand how Pritikin's three pillars impact the risk factors, the progression, and the management of heart disease.  The purpose of this lesson is to provide a high-level overview of the heart, heart disease, and how the Pritikin lifestyle positively impacts risk factors.  Exercise Biomechanics Clinical staff led group instruction and group discussion with PowerPoint presentation and patient guidebook. To enhance the learning environment the use of posters, models and videos may be added. Patients will learn how the structural parts of their bodies function and how these functions impact their daily activities, movement, and exercise. Patients will learn how to promote a neutral spine, learn how to manage pain, and identify ways to improve their physical movement in order to promote healthy living. The purpose of this lesson is to expose patients to common physical limitations that impact physical activity. Participants will learn practical ways to adapt and manage aches and pains, and to minimize their effect on regular exercise. Patients will learn how to maintain good posture while sitting, walking, and lifting.  Balance Training and Fall Prevention  Clinical staff led group instruction and group discussion with PowerPoint presentation and patient guidebook. To enhance the learning environment the use of posters, models and videos may be added. At the conclusion of this workshop, patients will understand the importance of their sensorimotor skills (vision, proprioception, and  the vestibular system) in maintaining their ability to balance as they age. Patients will apply a variety of balancing exercises that are appropriate for their  current level of function. Patients will understand the common causes for poor balance, possible solutions to these problems, and ways to modify their physical environment in order to minimize their fall risk. The purpose of this lesson is to teach patients about the importance of maintaining balance as they age and ways to minimize their risk of falling.  WORKSHOPS   Nutrition:  Fueling a Ship broker led group instruction and group discussion with PowerPoint presentation and patient guidebook. To enhance the learning environment the use of posters, models and videos may be added. Patients will review the foundational principles of the Pritikin Eating Plan and understand what constitutes a serving size in each of the food groups. Patients will also learn Pritikin-friendly foods that are better choices when away from home and review make-ahead meal and snack options. Calorie density will be reviewed and applied to three nutrition priorities: weight maintenance, weight loss, and weight gain. The purpose of this lesson is to reinforce (in a group setting) the key concepts around what patients are recommended to eat and how to apply these guidelines when away from home by planning and selecting Pritikin-friendly options. Patients will understand how calorie density may be adjusted for different weight management goals.  Mindful Eating  Clinical staff led group instruction and group discussion with PowerPoint presentation and patient guidebook. To enhance the learning environment the use of posters, models and videos may be added. Patients will briefly review the concepts of the Pritikin Eating Plan and the importance of low-calorie dense foods. The concept of mindful eating will be introduced as well as the importance of paying attention to internal hunger signals. Triggers for non-hunger eating and techniques for dealing with triggers will be explored. The purpose of this lesson is to provide  patients with the opportunity to review the basic principles of the Pritikin Eating Plan, discuss the value of eating mindfully and how to measure internal cues of hunger and fullness using the Hunger Scale. Patients will also discuss reasons for non-hunger eating and learn strategies to use for controlling emotional eating.  Targeting Your Nutrition Priorities Clinical staff led group instruction and group discussion with PowerPoint presentation and patient guidebook. To enhance the learning environment the use of posters, models and videos may be added. Patients will learn how to determine their genetic susceptibility to disease by reviewing their family history. Patients will gain insight into the importance of diet as part of an overall healthy lifestyle in mitigating the impact of genetics and other environmental insults. The purpose of this lesson is to provide patients with the opportunity to assess their personal nutrition priorities by looking at their family history, their own health history and current risk factors. Patients will also be able to discuss ways of prioritizing and modifying the Pritikin Eating Plan for their highest risk areas  Menu  Clinical staff led group instruction and group discussion with PowerPoint presentation and patient guidebook. To enhance the learning environment the use of posters, models and videos may be added. Using menus brought in from [employer], or printed from [employer], patients will apply the Pritikin dining out guidelines that were presented in the [employer] video. Patients will also be able to practice these guidelines in a variety of provided scenarios. The purpose of this lesson is to provide patients with the opportunity  to practice hands-on learning of the [employer] guidelines with actual menus and practice scenarios.  Label Reading Clinical staff led group instruction and group discussion with PowerPoint  presentation and patient guidebook. To enhance the learning environment the use of posters, models and videos may be added. Patients will review and discuss the Pritikin label reading guidelines presented in Pritikin's Label Reading Educational series video. Using fool labels brought in from local grocery stores and markets, patients will apply the label reading guidelines and determine if the packaged food meet the Pritikin guidelines. The purpose of this lesson is to provide patients with the opportunity to review, discuss, and practice hands-on learning of the Pritikin Label Reading guidelines with actual packaged food labels. Cooking School  Pritikin's [employer] are designed to teach patients ways to prepare quick, simple, and affordable recipes at home. The importance of nutrition's role in chronic disease risk reduction is reflected in its emphasis in the overall Pritikin program. By learning how to prepare essential core Pritikin Eating Plan recipes, patients will increase control over what they eat; be able to customize the flavor of foods without the use of added salt, sugar, or fat; and improve the quality of the food they consume. By learning a set of core recipes which are easily assembled, quickly prepared, and affordable, patients are more likely to prepare more healthy foods at home. These workshops focus on convenient breakfasts, simple entres, side dishes, and desserts which can be prepared with minimal effort and are consistent with nutrition recommendations for cardiovascular risk reduction. Cooking [employer] are taught by a Armed forces logistics/support/administrative officer (RD) who has been trained by the [employer]. The chef or RD has a clear understanding of the importance of minimizing - if not completely eliminating - added fat, sugar, and sodium in recipes. Throughout the series of Cooking School Workshop sessions, patients will learn about healthy ingredients and  efficient methods of cooking to build confidence in their capability to prepare    Cooking School weekly topics:  Adding Flavor- Sodium-Free  Fast and Healthy Breakfasts  Powerhouse Plant-Based Proteins  Satisfying Salads and Dressings  Simple Sides and Sauces  International Cuisine-Spotlight on the [employer] Zones  Delicious Desserts  Savory Soups  [employer] - Meals in a Astronomer Appetizers and Snacks  Comforting Weekend Breakfasts  One-Pot Wonders   Fast Evening Meals  Landscape architect Your Pritikin Plate  WORKSHOPS   Healthy Mindset (Psychosocial):  Focused Goals, Sustainable Changes Clinical staff led group instruction and group discussion with PowerPoint presentation and patient guidebook. To enhance the learning environment the use of posters, models and videos may be added. Patients will be able to apply effective goal setting strategies to establish at least one personal goal, and then take consistent, meaningful action toward that goal. They will learn to identify common barriers to achieving personal goals and develop strategies to overcome them. Patients will also gain an understanding of how our mind-set can impact our ability to achieve goals and the importance of cultivating a positive and growth-oriented mind-set. The purpose of this lesson is to provide patients with a deeper understanding of how to set and achieve personal goals, as well as the tools and strategies needed to overcome common obstacles which may arise along the way.  From Head to Heart: The Power of a Healthy Outlook  Clinical staff led group instruction and group discussion with PowerPoint presentation and patient guidebook. To enhance the learning environment  the use of posters, models and videos may be added. Patients will be able to recognize and describe the impact of emotions and mood on physical health. They will discover the importance of self-care and explore self-care  practices which may work for them. Patients will also learn how to utilize the 4 C's to cultivate a healthier outlook and better manage stress and challenges. The purpose of this lesson is to demonstrate to patients how a healthy outlook is an essential part of maintaining good health, especially as they continue their cardiac rehab journey.  Healthy Sleep for a Healthy Heart Clinical staff led group instruction and group discussion with PowerPoint presentation and patient guidebook. To enhance the learning environment the use of posters, models and videos may be added. At the conclusion of this workshop, patients will be able to demonstrate knowledge of the importance of sleep to overall health, well-being, and quality of life. They will understand the symptoms of, and treatments for, common sleep disorders. Patients will also be able to identify daytime and nighttime behaviors which impact sleep, and they will be able to apply these tools to help manage sleep-related challenges. The purpose of this lesson is to provide patients with a general overview of sleep and outline the importance of quality sleep. Patients will learn about a few of the most common sleep disorders. Patients will also be introduced to the concept of "sleep hygiene," and discover ways to self-manage certain sleeping problems through simple daily behavior changes. Finally, the workshop will motivate patients by clarifying the links between quality sleep and their goals of heart-healthy living.   Recognizing and Reducing Stress Clinical staff led group instruction and group discussion with PowerPoint presentation and patient guidebook. To enhance the learning environment the use of posters, models and videos may be added. At the conclusion of this workshop, patients will be able to understand the types of stress reactions, differentiate between acute and chronic stress, and recognize the impact that chronic stress has on their health. They  will also be able to apply different coping mechanisms, such as reframing negative self-talk. Patients will have the opportunity to practice a variety of stress management techniques, such as deep abdominal breathing, progressive muscle relaxation, and/or guided imagery.  The purpose of this lesson is to educate patients on the role of stress in their lives and to provide healthy techniques for coping with it.  Learning Barriers/Preferences:  Learning Barriers/Preferences - 01/11/24 1431       Learning Barriers/Preferences   Learning Barriers Sight   contacts   Learning Preferences Audio;Computer/Internet;Group Instruction;Individual Instruction;Pictoral;Skilled Demonstration;Verbal Instruction;Video;Written Material          Education Topics:  Knowledge Questionnaire Score:  Knowledge Questionnaire Score - 01/11/24 1432       Knowledge Questionnaire Score   Pre Score 24/24          Core Components/Risk Factors/Patient Goals at Admission:  Personal Goals and Risk Factors at Admission - 01/11/24 1431       Core Components/Risk Factors/Patient Goals on Admission    Weight Management Yes;Weight Maintenance    Intervention Weight Management: Develop a combined nutrition and exercise program designed to reach desired caloric intake, while maintaining appropriate intake of nutrient and fiber, sodium and fats, and appropriate energy expenditure required for the weight goal.;Weight Management: Provide education and appropriate resources to help participant work on and attain dietary goals.    Expected Outcomes Short Term: Continue to assess and modify interventions until short term weight is achieved;Long Term:  Adherence to nutrition and physical activity/exercise program aimed toward attainment of established weight goal;Weight Maintenance: Understanding of the daily nutrition guidelines, which includes 25-35% calories from fat, 7% or less cal from saturated fats, less than 200mg   cholesterol, less than 1.5gm of sodium, & 5 or more servings of fruits and vegetables daily;Understanding recommendations for meals to include 15-35% energy as protein, 25-35% energy from fat, 35-60% energy from carbohydrates, less than 200mg  of dietary cholesterol, 20-35 gm of total fiber daily;Understanding of distribution of calorie intake throughout the day with the consumption of 4-5 meals/snacks    Hypertension Yes    Intervention Provide education on lifestyle modifcations including regular physical activity/exercise, weight management, moderate sodium restriction and increased consumption of fresh fruit, vegetables, and low fat dairy, alcohol moderation, and smoking cessation.;Monitor prescription use compliance.    Expected Outcomes Short Term: Continued assessment and intervention until BP is < 140/70mm HG in hypertensive participants. < 130/94mm HG in hypertensive participants with diabetes, heart failure or chronic kidney disease.;Long Term: Maintenance of blood pressure at goal levels.          Core Components/Risk Factors/Patient Goals Review:   Goals and Risk Factor Review     Row Name 01/16/24 1638 02/01/24 1213 02/29/24 0737         Core Components/Risk Factors/Patient Goals Review   Personal Goals Review Weight Management/Obesity;Hypertension Weight Management/Obesity;Hypertension Weight Management/Obesity;Hypertension     Review Verbie started cardiac rehab on 01/16/24, Yvonda did well with exercise. Vital signs were stable Niyla started cardiac rehab on 01/16/24, Ivanna is off to a good start with exercise with exercise at cardiac rehab.Intermittent resting and exertional BP elevations noted at cardiac rehab. Patient's cardiologist at [facility] was notfied. medications have been adjusted. Will continue to monitor BP's. Tationna is doing well with exercise with exercise at cardiac rehab.Intermittent resting and exertional BP elevations noted at cardiac rehab.  Will continue to  monitor BP's. Soliana has increased her workloads     Expected Outcomes Porsha will continue to participate in cardiac rehab for exercise, nutrition and lifestyle modifications Alizae will continue to participate in cardiac rehab for exercise, nutrition and lifestyle modifications Maisey will continue to participate in cardiac rehab for exercise, nutrition and lifestyle modifications        Core Components/Risk Factors/Patient Goals at Discharge (Final Review):   Goals and Risk Factor Review - 02/29/24 0737       Core Components/Risk Factors/Patient Goals Review   Personal Goals Review Weight Management/Obesity;Hypertension    Review Rolanda is doing well with exercise with exercise at cardiac rehab.Intermittent resting and exertional BP elevations noted at cardiac rehab.  Will continue to monitor BP's. Miabella has increased her workloads    Expected Outcomes Ammanda will continue to participate in cardiac rehab for exercise, nutrition and lifestyle modifications          ITP Comments:  ITP Comments     Row Name 01/11/24 1411 01/16/24 1636 02/01/24 1211 02/29/24 0735     ITP Comments Dr. Gaylyn Keas medical director. Introduction to pritikin education/intensive cardiac rehab. Initial orientation packet reviewed with patient. 30 day ITP Review. Dianna started cardiac rehab on 01/16/24. Tamyka did well with exercise. 30 day ITP Review. Crystalann started cardiac rehab on 01/16/24. Okie is off to a good start with exercise. 30 day ITP Review. Ayrianna has good participation in cardiac rehab when in attendance.       Comments: See ITP comments.[name] RN BSN

## 2024-03-02 ENCOUNTER — Encounter (HOSPITAL_COMMUNITY)
Admission: RE | Admit: 2024-03-02 | Discharge: 2024-03-02 | Disposition: A | Discharge status: Home / Self Care | Source: Ambulatory Visit | Attending: Cardiology | Admitting: Cardiology

## 2024-03-02 DIAGNOSIS — Z952 Presence of prosthetic heart valve: Secondary | ICD-10-CM

## 2024-03-02 DIAGNOSIS — Z48812 Encounter for surgical aftercare following surgery on the circulatory system: Secondary | ICD-10-CM | POA: Diagnosis not present

## 2024-03-05 ENCOUNTER — Encounter (HOSPITAL_COMMUNITY): Admission: RE | Admit: 2024-03-05 | Source: Ambulatory Visit

## 2024-03-07 ENCOUNTER — Encounter (HOSPITAL_COMMUNITY)

## 2024-03-09 ENCOUNTER — Encounter (HOSPITAL_COMMUNITY)
Admission: RE | Admit: 2024-03-09 | Discharge: 2024-03-09 | Disposition: A | Discharge status: Home / Self Care | Source: Ambulatory Visit | Attending: Cardiology

## 2024-03-09 DIAGNOSIS — Z952 Presence of prosthetic heart valve: Secondary | ICD-10-CM

## 2024-03-09 DIAGNOSIS — Z48812 Encounter for surgical aftercare following surgery on the circulatory system: Secondary | ICD-10-CM | POA: Diagnosis not present

## 2024-03-12 ENCOUNTER — Encounter (HOSPITAL_COMMUNITY)
Admission: RE | Admit: 2024-03-12 | Discharge: 2024-03-12 | Disposition: A | Discharge status: Home / Self Care | Source: Ambulatory Visit | Attending: Cardiology | Admitting: Cardiology

## 2024-03-12 DIAGNOSIS — Z952 Presence of prosthetic heart valve: Secondary | ICD-10-CM

## 2024-03-12 DIAGNOSIS — Z48812 Encounter for surgical aftercare following surgery on the circulatory system: Secondary | ICD-10-CM | POA: Diagnosis not present

## 2024-03-14 ENCOUNTER — Encounter (HOSPITAL_COMMUNITY): Admission: RE | Admit: 2024-03-14 | Source: Ambulatory Visit

## 2024-03-14 ENCOUNTER — Telehealth (HOSPITAL_COMMUNITY): Payer: Self-pay | Admitting: *Deleted

## 2024-03-14 NOTE — Telephone Encounter (Signed)
 Spoke with Melinda Lane. Navaya says she has a sore throat and a runny nose. Advised to be symptoms free for 48 hours prior to returning to cardiac rehab and to follow up with her PCP if symptoms persist.Joakim Huesman Elpidio Quan RN BSN

## 2024-03-19 ENCOUNTER — Encounter (HOSPITAL_COMMUNITY)
Admission: RE | Admit: 2024-03-19 | Discharge: 2024-03-19 | Disposition: A | Discharge status: Home / Self Care | Source: Ambulatory Visit | Attending: Cardiology | Admitting: Cardiology

## 2024-03-19 DIAGNOSIS — Z48812 Encounter for surgical aftercare following surgery on the circulatory system: Secondary | ICD-10-CM | POA: Insufficient documentation

## 2024-03-19 DIAGNOSIS — Z952 Presence of prosthetic heart valve: Secondary | ICD-10-CM | POA: Insufficient documentation

## 2024-03-21 ENCOUNTER — Telehealth (HOSPITAL_COMMUNITY): Payer: Self-pay

## 2024-03-21 ENCOUNTER — Encounter (HOSPITAL_COMMUNITY): Payer: Self-pay | Admitting: *Deleted

## 2024-03-21 ENCOUNTER — Encounter (HOSPITAL_COMMUNITY): Admission: RE | Admit: 2024-03-21 | Source: Ambulatory Visit

## 2024-03-21 DIAGNOSIS — Z952 Presence of prosthetic heart valve: Secondary | ICD-10-CM

## 2024-03-21 NOTE — Progress Notes (Signed)
 Discharge Progress Report  Patient Details  Name: Melinda Lane MRN: [id number] Date of Birth: July 01, 1951 Referring Provider:   Flowsheet Row INTENSIVE CARDIAC REHAB ORIENT from 01/11/2024 in [facility]  Referring Provider Wilbert Bihari, MD     Number of Visits: 28  Reason for Discharge:  Early Exit:  Back to work  Smoking History:  Social History   Tobacco Use  Smoking Status Never  Smokeless Tobacco Never    Diagnosis:  11/29/23 AV Replacement  ADL [facility]:   Initial Exercise Prescription:   Discharge Exercise Prescription (Final Exercise Prescription Changes):  Exercise Prescription Changes - 02/29/24 1709       Response to Exercise   Blood Pressure (Admit) 120/60    Blood Pressure (Exit) 108/64    Heart Rate (Admit) 78 bpm    Heart Rate (Exercise) 87 bpm    Heart Rate (Exit) 81 bpm    Rating of Perceived Exertion (Exercise) 11    Symptoms 6/10 back pain    Comments REVD MET's and goals    Duration Continue with 30 min of aerobic exercise without signs/symptoms of physical distress.    Intensity THRR unchanged      Progression   Progression Continue to progress workloads to maintain intensity without signs/symptoms of physical distress.    Average METs 3.67      Resistance Training   Training Prescription No    Weight 3 lbs    Reps 10-15    Time 10 Minutes      Interval Training   Interval Training No      Recumbant Elliptical   Level 3    RPM 52    Watts 75    Minutes 15    METs 4.3      Track   Laps 16    Minutes 15    METs 3.04          Functional Capacity:   Psychological, QOL, Others - Outcomes: PHQ 2/9:    01/11/2024    2:30 PM  Depression screen PHQ 2/9  Decreased Interest 0  Down, Depressed, Hopeless 0  PHQ - 2 Score 0  Altered sleeping 1  Tired, decreased energy 1  Change in appetite 0  Feeling bad or failure about yourself  0  Trouble concentrating 0  Moving  slowly or fidgety/restless 0  Suicidal thoughts 0  PHQ-9 Score 2  Difficult doing work/chores Somewhat difficult    Quality of Life:   Personal Goals: Goals established at orientation with interventions provided to work toward goal.    Personal Goals Discharge:  Goals and Risk Factor Review     Row Name 02/01/24 1213 02/29/24 0737 03/29/24 1541         Core Components/Risk Factors/Patient Goals Review   Personal Goals Review Weight Management/Obesity;Hypertension Weight Management/Obesity;Hypertension Weight Management/Obesity;Hypertension     Review Melinda Lane started cardiac rehab on 01/16/24, Melinda Lane is off to a good start with exercise with exercise at cardiac rehab.Intermittent resting and exertional BP elevations noted at cardiac rehab. Patient's cardiologist at [facility] was notfied. medications have been adjusted. Will continue to monitor BP's. Melinda Lane is doing well with exercise with exercise at cardiac rehab.Intermittent resting and exertional BP elevations noted at cardiac rehab.  Will continue to monitor BP's. Melinda Lane has increased her workloads Melinda Lane completed exercise at cardiac rehab early to return to work.     Expected Outcomes Melinda Lane will continue to participate in cardiac rehab for exercise,  nutrition and lifestyle modifications Melinda Lane will continue to participate in cardiac rehab for exercise, nutrition and lifestyle modifications Melinda Lane will continue to exercise,  follow nutrition and lifestyle modifications on her own.        Exercise Goals and Review:   Exercise Goals Re-Evaluation:  Exercise Goals Re-Evaluation     Row Name 02/08/24 1630 02/09/24 0822 02/29/24 1712         Exercise Goal Re-Evaluation   Exercise Goals Review Increase Physical Activity;Increase Strength and Stamina;Able to understand and use rate of perceived exertion (RPE) scale;Knowledge and understanding of Target Heart Rate Range (THRR);Understanding of Exercise Prescription -- Increase Physical  Activity;Increase Strength and Stamina;Able to understand and use rate of perceived exertion (RPE) scale;Knowledge and understanding of Target Heart Rate Range (THRR);Understanding of Exercise Prescription     Comments REVD MET's and goals. Pt tolerated exercise well with an average MET level of 3.32. First day back, will continue to work with her to progress workloads as tolerated and increase MET's over time. She will be out next week for travel. But is feeling good and has gotten back to golfing and is enjoying it -- REVD MET's and goals. Pt tolerated exercise well with an average MET level of 3.67. She is doing well and her BP readings are more stable. She is increasing MET's, strength and stamina. Wants to work on UB strength will look to see when restrictions are to be lifted     Expected Outcomes Progress workloads as tolerated to help improve cardiorespiratory fitness. -- Progress workloads as tolerated to help improve cardiorespiratory fitness.        Nutrition & Weight - Outcomes:    Nutrition:  Nutrition Therapy & Goals - 03/21/24 1128       Nutrition Therapy   Diet Heart healthy Diet    Drug/Food Interactions Statins/Certain Fruits      Personal Nutrition Goals   Comments Patient unable to complete program due to work schedule. Goals in progress. Patient has medical history of crohn's disease, parkinson's, s/p AVR, renovascular HTN, hyperlipidemia. Melinda Lane has attended 7 of the education sessions. Lipids and A1c are WNL. Lasix 20mg  started for seven days at 12/27/23 cardiology follow-up and olmesartan 40mg  started on 02/10/23 to aid with improved blood pressure control. She has maintained her weight since starting with our program. Patient will benefit from adherence to nutrition, exercise, and lifestyle modification.      Intervention Plan   Intervention Prescribe, educate and counsel regarding individualized specific dietary modifications aiming towards targeted core components such  as weight, hypertension, lipid management, diabetes, heart failure and other comorbidities.;Nutrition handout(s) given to patient.    Expected Outcomes Short Term Goal: Understand basic principles of dietary content, such as calories, fat, sodium, cholesterol and nutrients.;Long Term Goal: Adherence to prescribed nutrition plan.          Nutrition Discharge:   Education Questionnaire Score:   Pt  completed of 28 exercise and education sessions. Pt maintained good attendance and progressed nicely during her participation in rehab as evidenced by increased MET level.  Pt has made significant lifestyle changes and should be commended for her success. Nitya achieved their goals during cardiac rehab. Patient completed the program early to return to work. Betzaida said that she benefited from participating in the program. Alessandra's last day of attendance at cardiac rehab was on 03/12/24.Hadassah Elpidio Quan RN BSN

## 2024-03-21 NOTE — Telephone Encounter (Signed)
 Patient called stating that due to her work schedule she is unable to continue cardiac rehab classes at this time. She feels she has benefited greatly from the classes she has attended and made progress, and hopes to maybe return in the future.  Cancelled cardiac rehab classes and removed from schedule.

## 2024-03-23 ENCOUNTER — Encounter (HOSPITAL_COMMUNITY)

## 2024-03-26 ENCOUNTER — Encounter (HOSPITAL_COMMUNITY)

## 2024-03-27 ENCOUNTER — Other Ambulatory Visit: Payer: Self-pay | Admitting: Neurology

## 2024-03-27 DIAGNOSIS — G20A2 Parkinson's disease without dyskinesia, with fluctuations: Secondary | ICD-10-CM

## 2024-03-28 ENCOUNTER — Encounter (HOSPITAL_COMMUNITY)

## 2024-03-30 ENCOUNTER — Encounter (HOSPITAL_COMMUNITY)

## 2024-04-02 ENCOUNTER — Encounter (HOSPITAL_COMMUNITY)

## 2024-04-04 ENCOUNTER — Encounter (HOSPITAL_COMMUNITY)

## 2024-04-06 ENCOUNTER — Encounter (HOSPITAL_COMMUNITY)

## 2024-04-09 ENCOUNTER — Encounter (HOSPITAL_COMMUNITY)

## 2024-04-11 ENCOUNTER — Encounter (HOSPITAL_COMMUNITY)

## 2024-04-13 ENCOUNTER — Encounter (HOSPITAL_COMMUNITY)

## 2024-04-16 ENCOUNTER — Encounter (HOSPITAL_COMMUNITY)

## 2024-04-18 ENCOUNTER — Encounter (HOSPITAL_COMMUNITY)

## 2024-05-31 NOTE — Progress Notes (Signed)
 [facility] [address] [place]. [place] [address] [phone]       OFFICE FOLLOW UP NOTE  Ms. Veva Abu Date of Birth:  1951/07/25 Medical Record Number:  [id number]    Primary neurologist: Dr. Buck Reason for visit: Parkinson's disease    SUBJECTIVE:  CHIEF COMPLAINT:  Chief Complaint  Patient presents with   Follow-up    Rm 8, alone.        Follow-up visit:  Prior visit: 11/10/2023 with Dr. Buck  Brief HPI:   Enedelia Martorelli is a 73 y.o. female who is followed for Parkinson's disease and RLS.  Onset of PD symptoms with tremor around 03/2019 primarily affecting right arm.  She was started on Sinemet  in 07/2019.  She was started on Mysoline  by [facility] neurology in 2021.  DaTscan  01/2022 showed significantly decreased striatal luflupane activity.  She started to complain of RLS symptoms in 2023 with work up showing low TSH possibly causing symptoms and advised to follow-up with PCP.  She was started on pramipexole by PCP.  At prior visit with Dr. Buck, with noting progression of motor symptoms and increased Sinemet  to 1.5 pills 4 times daily.    Interval history:  Patient returns for follow-up visit unaccompanied.  She reports improvement of symptoms on 1-1/2 tablets of Sinemet  4 times daily, currently takes around 8 AM, 12 PM, 5:30-6PM and 11:30PM. Denies any wearing off effects or any noticeable side effects. Symptoms are typically worse in general upon first getting up or if she needs to go to the bathroom in the middle of the night, can have increased stiffness and shuffling gait.  She will occasionally notice tremors in her lower extremity.  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  At times writing can be affected which can impact her work.  She also has noticed she has difficulty projecting her voice and occasional difficulty  getting words out correctly.  She continues to drive and maintains ADLs and IADLs independently.  Denies any swallowing difficulty or major concerns with constipation.  She continues on pramipexole for RLS per PCP.      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 right   Cephalalgia 11/15/2014   Chest pain 04/12/2012   Crohn's disease of ileum without complication (HCC) 03/30/2016   Elevated troponin    Esophageal dysphagia 01/13/2017   HTN (hypertension) 04/12/2012   Hyperlipemia 04/12/2012   Hyperlipidemia 04/12/2012   Hypothyroidism    IBS (irritable bowel syndrome)    Incontinence in female 11/12/2014   Left knee pain 10/14/2011   Parkinson's disease (HCC) 07/24/2019   Pes anserine bursitis 10/14/2011   Primary osteoarthritis involving multiple joints 11/25/2017   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(HCC)    RLS (restless legs syndrome) 10/10/2019   Thyroid  nodule 04/12/2012   Tremor 10/10/2019  PSH:  Past Surgical History:  Procedure Laterality Date   APPENDECTOMY     AUGMENTATION MAMMAPLASTY     saline - 2015   CHOLECYSTECTOMY     ECTOPIC PREGNANCY SURGERY     KNEE SURGERY     LEFT HEART CATH AND CORONARY ANGIOGRAPHY N/A 03/11/2021   Procedure: LEFT HEART CATH AND CORONARY ANGIOGRAPHY;  Surgeon: Burnard Debby LABOR, MD;  Location: [facility]ASIVE [facility];  Service: Cardiovascular;  Laterality: N/A;    TONSILLECTOMY      Social History:  Social History   Socioeconomic History   Marital status: Married    Spouse name: Not on file   Number of children: 2   Years of education: HS   Highest education level: Not on file  Occupational History   Occupation: Accounting  Tobacco Use   Smoking status: Never   Smokeless tobacco: Never  Vaping Use   Vaping status: Never Used  Substance and Sexual Activity   Alcohol use: Yes    Alcohol/week: 4.0 standard drinks of alcohol    Types: 4 Glasses of wine per week    Comment: One weekly - 2 glasses of wine   Drug use: No   Sexual activity: Yes    Birth control/protection: Post-menopausal    Comment: 1st intercourse 73 yo-Fewer than 5 partners  Other Topics Concern   Not on file  Social History Narrative   Lives at home with her husband.   Right-handed.   1 cup/day of tea    Social Drivers of Corporate investment banker Strain: Low Risk  (10/12/2023)   Received from [facility]   Overall Financial Resource Strain (CARDIA)    Difficulty of Paying Living Expenses: Not hard at all  Food Insecurity: No Food Insecurity (11/29/2023)   [facility]   Hunger Vital Sign    Within the past 12 months, you worried that your food would run out before you got the money to buy more.: Never true    Within the past 12 months, the food you bought just didn't last and you didn't have money to get more.: Never true  Transportation Needs: No Transportation Needs (12/02/2023)   Received [facility] - Transportation    Lack of Transportation (Medical): No    Lack of Transportation (Non-Medical): No  Physical Activity: Not on file  Stress: No Stress Concern Present (11/29/2023)   Received from [facility] - Occupational Stress Questionnaire    Feeling of Stress : Not at all  Social Connections: Unknown (01/16/2022)   Received from [facility]   Social Network    Social  Network: Not on file  Intimate Partner Violence: Not At Risk (11/29/2023)   Received from [facility]   HITS    Over the last 12 months how often did your partner physically hurt you?: Never    Over the last 12 months how often did your partner insult you or talk down to you?: Never    Over the last 12 months how often did your partner threaten you with physical harm?: Never    Over the last 12 months how often did your partner scream or curse at you?: Never    Family History:  Family History  Problem Relation Age of Onset   Hypertension Mother    Heart disease Mother    Restless legs syndrome Mother    Parkinson's disease Mother    Hypertension Father  Heart disease Father     Medications:   Current Outpatient Medications on File Prior to Visit  Medication Sig Dispense Refill   amoxicillin (AMOXIL) 500 MG tablet Take 500 mg by mouth as needed (dental appts).     celecoxib (CELEBREX) 200 MG capsule Take 200 mg by mouth daily.     cholecalciferol  (VITAMIN D) 1000 UNITS tablet Take 1,000 Units by mouth daily.     dicyclomine (BENTYL) 20 MG tablet Take 20 mg by mouth 4 (four) times daily.     docusate sodium (COLACE) 100 MG capsule Take 100 mg by mouth daily.     ferrous sulfate 325 (65 FE) MG tablet Take 325 mg by mouth daily with breakfast.     MAGNESIUM PO Take 200 mg by mouth daily.     methocarbamol (ROBAXIN) 500 MG tablet Take 1 tablet every 6-8 hours by oral route as needed for spasm for 10 days.     metoprolol  succinate (TOPROL -XL) 25 MG 24 hr tablet Take 25 mg by mouth daily.     Multiple Vitamin (MULTIVITAMIN) tablet Take 1 tablet by mouth daily.     NONFORMULARY OR COMPOUNDED ITEM Apply 4-6 tablets topically every 4 (four) months. Estrogen and Testosterone pellets put under the skin     NP THYROID  90 MG tablet Take 60 mg by mouth every morning.     Omega-3 Fatty Acids (FISH OIL) 1000 MG CAPS Take 1,000 mg by mouth daily.     pramipexole (MIRAPEX) 0.25 MG tablet Take  0.5 mg by mouth daily.     progesterone  (PROMETRIUM ) 200 MG capsule Take 400 mg by mouth daily. Compound Rx     SKYRIZI 360 MG/2.4ML SOCT Inject into the skin.     aspirin  buffered (BUFFERIN) 325 MG TABS tablet Take 325 mg by mouth daily. (Patient not taking: Reported on 06/04/2024)     No current facility-administered medications on file prior to visit.    Allergies:   Allergies  Allergen Reactions   Zithromax [Azithromycin] Rash      OBJECTIVE:  Physical Exam  Vitals:   06/04/24 1117  BP: 126/83  Pulse: 75  Weight: 140 lb 9.6 oz (63.8 kg)  Height: 5' 4.75 (1.645 m)   Body mass index is 23.58 kg/m. No results found.   General: well developed, well nourished, very pleasant elderly Caucasian female, seated, in no evident distress  Neurologic Exam Mental Status: Awake and fully alert. Oriented to place and time. Recent and remote memory intact. Attention span, concentration and fund of knowledge appropriate. Mood and affect appropriate.  Mild to moderate facial masking.  Speech clear but mild hypophonia noted. Cranial Nerves: Pupils equal, briskly reactive to light. Extraocular movements full without nystagmus. Visual fields full to confrontation. Hearing intact. Facial sensation intact. Face, tongue, palate moves normally and symmetrically.  Motor: Normal strength in all tested extremity muscles. Mildly increased tone RUE>LUE. No significant resting tremor. Moderate difficulty with finger taps and foot taps R>L. Slight postural tremor RUE.  Sensory.: intact to touch , pinprick , position and vibratory sensation.  Gait and Station: Arises from chair without difficulty. Stance is normal. Gait demonstrates fast paced gait, decreased arm swing on right, decreased step height but no obvious shuffling.  Reflexes: 1+ and symmetric. Toes downgoing.         ASSESSMENT/PLAN: Hildagarde Holleran is a 73 y.o. year old female who is followed for primarily right sided Parkinson's disease with  symptoms dating back to 2020 and DaTscan  2023 consistent with  PD.      Parkinson's disease:  Continue Sinemet  IR 1.5 tabs 4 times daily - will change times to 8am, 12pm, 4pm and 8pm Start Sinemet  CR 1 tab nightly at bedtime to help with morning and overnight symptoms Referrals placed to PT/OT/SLP at [facility] Discussed importance of routine physical activity and exercise      Follow up in 6 months or call earlier if needed   CC:  PCP: Knox Toribio NOVAK., MD (Inactive)    I personally spent a total of 30 minutes in the care of the patient today including preparing to see the patient, performing a medically appropriate exam/evaluation, counseling and educating, placing orders, and documenting clinical information in the EHR.  Harlene Bogaert, AGNP-BC  [facility] [address] [place], [place] [address]  Phone [phone] Fax [phone] Note: This document was prepared with digital dictation and possible smart phrase technology. Any transcriptional errors that result from this process are unintentional.

## 2024-06-04 ENCOUNTER — Telehealth: Payer: Self-pay | Admitting: Adult Health

## 2024-06-04 ENCOUNTER — Encounter: Payer: Self-pay | Admitting: Adult Health

## 2024-06-04 ENCOUNTER — Ambulatory Visit: Payer: Medicare Other | Admitting: Adult Health

## 2024-06-04 VITALS — BP 126/83 | HR 75 | Ht 64.75 in | Wt 140.6 lb

## 2024-06-04 DIAGNOSIS — G20A2 Parkinson's disease without dyskinesia, with fluctuations: Secondary | ICD-10-CM | POA: Diagnosis not present

## 2024-06-04 MED ORDER — CARBIDOPA-LEVODOPA ER 50-200 MG PO TBCR: 1.0000 | EXTENDED_RELEASE_TABLET | Freq: Every day | ORAL | 11 refills | Status: DC

## 2024-06-04 MED ORDER — CARBIDOPA-LEVODOPA 25-100 MG PO TABS: 1.5000 | ORAL_TABLET | Freq: Four times a day (QID) | ORAL | 11 refills | Status: DC

## 2024-06-04 NOTE — Patient Instructions (Addendum)
 Your Plan:  Continue Sinemet  1.5 tabs four times daily - will adjust times to 8am, 12pm, 4pm and 8pm  Start Sinemet  CR 1 tab at bedtime to hopefully help with nighttime and morning symptoms   Will place referral to [facility] for physical, occupational and speech       Follow up in 6 months or call earlier if needed      Thank you for coming to see us  at [facility]. I hope we have been able to provide you high quality care today.  You may receive a patient satisfaction survey over the next few weeks. We would appreciate your feedback and comments so that we may continue to improve ourselves and the health of our patients.

## 2024-06-04 NOTE — Telephone Encounter (Signed)
 Referral for speech therapy fax to [facility]. Phone: [phone], Fax: [phone]

## 2024-06-04 NOTE — Telephone Encounter (Signed)
Referral for physical therapy fax to [facility]. Phone: [phone], Fax: [phone].

## 2024-06-04 NOTE — Telephone Encounter (Signed)
 Referral for occupational therapy fax to [facility]. Phone: [phone], Fax: [phone]

## 2024-07-24 ENCOUNTER — Telehealth: Payer: Self-pay | Admitting: Adult Health

## 2024-07-24 DIAGNOSIS — G20A2 Parkinson's disease without dyskinesia, with fluctuations: Secondary | ICD-10-CM

## 2024-07-24 NOTE — Telephone Encounter (Signed)
 Pt called stating that she is wanting to discuss poss dosage change on her carbidopa -levodopa  (SINEMET  CR) 50-200 MG tablet  carbidopa -levodopa  (SINEMET  IR) 25-100 MG tablet Please advise.

## 2024-07-24 NOTE — Telephone Encounter (Signed)
 Returned call to patient, she reports taking sinemet  as prescribed at last visit but in the last 2 weeks she is reporting more stiffness in ankles and feet. She is afraid of falling. She is wanting to ask if she can take 2 sinement IR 4 times a day and still take the 1 sinemet  CR at bedtime or if there are other options. Advised I would send to harlene to review and follow back up. Patient appreciative of call

## 2024-07-25 MED ORDER — CARBIDOPA-LEVODOPA 25-100 MG PO TABS: 2.0000 | ORAL_TABLET | Freq: Four times a day (QID) | ORAL | 11 refills | Status: DC

## 2024-07-25 NOTE — Telephone Encounter (Signed)
 Okay to increase Sinemet  IR to 2 tablets 4 times per day at the same times discussed at recent visit and continue Sinemet  CR 1 tab at bedtime.

## 2024-07-26 NOTE — Telephone Encounter (Signed)
 Patient said would like referral sent to [facility] due [facility] has closed. Phone: [phone], Fax: [phone]  Could you create a new referral to send to [facility]?

## 2024-07-26 NOTE — Telephone Encounter (Signed)
 Referral for Physical Therapy faxed to [facility]   [facility]  Phone :[phone] Fax: [phone]

## 2024-07-26 NOTE — Addendum Note (Signed)
 Addended by: JOSHUA IZETTA CROME on: 07/26/2024 01:27 PM   Modules accepted: Orders

## 2024-07-26 NOTE — Telephone Encounter (Signed)
 Referral placed for cosign

## 2024-09-26 ENCOUNTER — Telehealth: Payer: Self-pay

## 2024-09-26 NOTE — Telephone Encounter (Signed)
 Signed notes faxed back to [facility] on 09/26/2024

## 2024-10-10 NOTE — Telephone Encounter (Signed)
 Signed plan of care sent back to [facility] physical therapy 10/10/2024.

## 2024-10-18 ENCOUNTER — Other Ambulatory Visit: Payer: Self-pay

## 2024-10-18 DIAGNOSIS — G20A2 Parkinson's disease without dyskinesia, with fluctuations: Secondary | ICD-10-CM

## 2024-10-18 MED ORDER — CARBIDOPA-LEVODOPA ER 50-200 MG PO TBCR: 1.0000 | EXTENDED_RELEASE_TABLET | Freq: Every day | ORAL | 5 refills | Status: AC

## 2024-10-18 MED ORDER — CARBIDOPA-LEVODOPA 25-100 MG PO TABS: 2.0000 | ORAL_TABLET | Freq: Four times a day (QID) | ORAL | 5 refills | Status: AC

## 2024-10-18 NOTE — Telephone Encounter (Signed)
 Last refilled by patient on : 09/20/24 Last office visit : 06/04/24 Next office visit : 12/26/24 Per last office note: Okay to increase Sinemet  IR to 2 tablets 4 times per day at the same times discussed at recent visit and continue Sinemet  CR 1 tab at bedtime.

## 2024-12-26 ENCOUNTER — Ambulatory Visit: Admitting: Adult Health
